# Patient Record
Sex: Female | Born: 1935 | Race: White | Hispanic: No | Marital: Married | Smoking: Never smoker
Health system: Southern US, Community
[De-identification: ages and names within clinical notes are randomized; demographics above are authoritative.]

## PROBLEM LIST (undated history)

## (undated) DIAGNOSIS — F419 Anxiety disorder, unspecified: Secondary | ICD-10-CM

## (undated) DIAGNOSIS — R413 Other amnesia: Secondary | ICD-10-CM

## (undated) DIAGNOSIS — I480 Paroxysmal atrial fibrillation: Secondary | ICD-10-CM

## (undated) DIAGNOSIS — M1990 Unspecified osteoarthritis, unspecified site: Secondary | ICD-10-CM

## (undated) DIAGNOSIS — N393 Stress incontinence (female) (male): Secondary | ICD-10-CM

## (undated) DIAGNOSIS — K644 Residual hemorrhoidal skin tags: Secondary | ICD-10-CM

## (undated) DIAGNOSIS — E785 Hyperlipidemia, unspecified: Secondary | ICD-10-CM

## (undated) HISTORY — DX: Unspecified osteoarthritis, unspecified site: M19.90

## (undated) HISTORY — DX: Anxiety disorder, unspecified: F41.9

## (undated) HISTORY — DX: Paroxysmal atrial fibrillation: I48.0

## (undated) HISTORY — DX: Hyperlipidemia, unspecified: E78.5

## (undated) HISTORY — DX: Residual hemorrhoidal skin tags: K64.4

## (undated) HISTORY — DX: Stress incontinence (female) (male): N39.3

## (undated) HISTORY — PX: VAGINAL HYSTERECTOMY: SUR661

## (undated) HISTORY — PX: APPENDECTOMY: SHX54

---

## 2001-09-29 ENCOUNTER — Encounter: Admission: RE | Admit: 2001-09-29 | Discharge: 2001-09-29 | Payer: Self-pay | Admitting: *Deleted

## 2001-09-29 ENCOUNTER — Encounter: Payer: Self-pay | Admitting: *Deleted

## 2002-10-18 ENCOUNTER — Encounter: Payer: Self-pay | Admitting: *Deleted

## 2002-10-18 ENCOUNTER — Encounter: Admission: RE | Admit: 2002-10-18 | Discharge: 2002-10-18 | Payer: Self-pay | Admitting: *Deleted

## 2003-04-20 ENCOUNTER — Encounter: Payer: Self-pay | Admitting: Internal Medicine

## 2003-04-20 ENCOUNTER — Encounter: Admission: RE | Admit: 2003-04-20 | Discharge: 2003-04-20 | Payer: Self-pay | Admitting: Internal Medicine

## 2004-09-10 ENCOUNTER — Encounter: Admission: RE | Admit: 2004-09-10 | Discharge: 2004-09-10 | Payer: Self-pay | Admitting: Internal Medicine

## 2004-09-23 ENCOUNTER — Encounter: Admission: RE | Admit: 2004-09-23 | Discharge: 2004-09-23 | Payer: Self-pay | Admitting: Internal Medicine

## 2005-09-24 ENCOUNTER — Ambulatory Visit: Payer: Self-pay | Admitting: Gastroenterology

## 2005-09-26 ENCOUNTER — Encounter: Admission: RE | Admit: 2005-09-26 | Discharge: 2005-09-26 | Payer: Self-pay | Admitting: Internal Medicine

## 2005-10-06 ENCOUNTER — Ambulatory Visit: Payer: Self-pay | Admitting: Gastroenterology

## 2006-09-30 ENCOUNTER — Encounter: Admission: RE | Admit: 2006-09-30 | Discharge: 2006-09-30 | Payer: Self-pay | Admitting: Internal Medicine

## 2008-01-17 ENCOUNTER — Encounter: Admission: RE | Admit: 2008-01-17 | Discharge: 2008-01-17 | Payer: Self-pay | Admitting: Internal Medicine

## 2008-02-07 ENCOUNTER — Encounter: Admission: RE | Admit: 2008-02-07 | Discharge: 2008-02-07 | Payer: Self-pay | Admitting: Orthopedic Surgery

## 2008-02-09 ENCOUNTER — Ambulatory Visit (HOSPITAL_BASED_OUTPATIENT_CLINIC_OR_DEPARTMENT_OTHER): Admission: RE | Admit: 2008-02-09 | Discharge: 2008-02-09 | Payer: Self-pay | Admitting: Orthopedic Surgery

## 2010-12-21 ENCOUNTER — Encounter: Payer: Self-pay | Admitting: Internal Medicine

## 2011-04-15 NOTE — Op Note (Signed)
NAMEPRESLYNN, BIER NO.:  [id number]   MEDICAL RECORD NO.:  [id number]          PATIENT TYPE:  AMB   LOCATION:  DSC                          FACILITY:  [facility]   PHYSICIAN:  Leonides Grills, M.D.     DATE OF BIRTH:  [birth year]   DATE OF PROCEDURE:  02/09/2008  DATE OF DISCHARGE:                               OPERATIVE REPORT   PREOPERATIVE DIAGNOSIS:  Complication of hardware, right foot.   POSTOPERATIVE DIAGNOSIS:  Complication of hardware, right foot.   OPERATION:  Hardware removal, deep right foot.   ANESTHESIA:  General.   SURGEON:  Leonides Grills, M.D.   ASSISTANT:  Richardean Canal, P.A.   ESTIMATED BLOOD LOSS:  Minimal.   TOURNIQUET:  None.   COMPLICATIONS:  None.   DISPOSITION:  Stable to PR.   INDICATIONS:  This is an 77 year old female who had a previous fixation  to her right first metatarsal approximately 20 years ago.  The screw  head has become prominent and painful.  She was consented to the above  procedure.  All risks which include infection, nerve or vessel injury,  scar tissue formation that may become painful, were all explained,  questions were encouraged and answered.   OPERATION:  The patient was brought to the operating room and placed in  supine position after adequate general endotracheal tube anesthesia was  administered as well as Ancef gram IV piggyback.  The right lower  extremity was then prepped and draped in sterile manner.  No tourniquet  was used.  A longitudinal incision over the prominent head was then  made.  Dissection was carried down through skin.  Hemostasis was  obtained.  The actual head was freed of any soft tissue within the  hexagonal head and then with a small frag screwdriver the screw was  removed.  This was intact.  No evidence of hardware failure.  The area  was copiously irrigated with normal saline.  Skin was closed with 4-0  nylon stitch.  Sterile dressing was applied.  Hard sole shoe was  applied.   Patient was stable to PR.     Leonides Grills, M.D.  Electronically Signed    PB/MEDQ  D:  02/09/2008  T:  02/10/2008  Job:  [id number]

## 2011-08-25 LAB — POCT HEMOGLOBIN-HEMACUE: Hemoglobin: 13.2

## 2012-02-26 LAB — IFOBT (OCCULT BLOOD): IFOBT: POSITIVE

## 2012-03-10 ENCOUNTER — Encounter: Payer: Self-pay | Admitting: Gastroenterology

## 2012-04-01 ENCOUNTER — Encounter: Payer: Self-pay | Admitting: Gastroenterology

## 2012-04-01 ENCOUNTER — Ambulatory Visit (INDEPENDENT_AMBULATORY_CARE_PROVIDER_SITE_OTHER): Payer: Medicare Other | Admitting: Gastroenterology

## 2012-04-01 VITALS — BP 98/60 | HR 60 | Ht 65.0 in | Wt 107.0 lb

## 2012-04-01 DIAGNOSIS — R195 Other fecal abnormalities: Secondary | ICD-10-CM

## 2012-04-01 MED ORDER — MOVIPREP 100 G PO SOLR: 1.0000 | Freq: Once | ORAL | Status: DC

## 2012-04-01 NOTE — Patient Instructions (Signed)
You have been scheduled for a colonoscopy. Please follow written instructions given to you at your visit today.  Please pick up your prep kit at the pharmacy within the next 1-3 days. cc: Rodrigo Ran, MD

## 2012-04-01 NOTE — Progress Notes (Signed)
History of Present Illness: This is an 78 year old female here today with her husband. She was recently found to have Hemosure positive stool. I reviewed her recent records sent from Dr. Laurey Morale office. Her blood work was unremarkable.  She has no gastrointestinal complaints. She underwent screening colonoscopy in 2006 which was normal. Denies weight loss, abdominal pain, constipation, diarrhea, change in stool caliber, melena, hematochezia, nausea, vomiting, dysphagia, reflux symptoms, chest pain.  No Known Allergies Outpatient Prescriptions Prior to Visit  Medication Sig Dispense Refill  . alendronate (FOSAMAX) 70 MG tablet Take 70 mg by mouth every 7 (seven) days. Take with a full glass of water on an empty stomach.      . Calcium Carbonate (CALTRATE 600 PO) Take 1 capsule by mouth 2 (two) times daily.      . rosuvastatin (CRESTOR) 5 MG tablet Take 5 mg by mouth daily.      Marland Kitchen zolpidem (AMBIEN) 10 MG tablet Take 5 mg by mouth at bedtime as needed.       . citalopram (CELEXA) 40 MG tablet Take 40 mg by mouth daily.       Past Medical History  Diagnosis Date  . Hyperlipidemia   . Depression   . Allergic rhinitis   . Duodenal ulcer   . External hemorrhoid   . Anxiety   . SUI (stress urinary incontinence), female   . Osteoarthritis   . Osteoporosis   . Vitamin d deficiency    Past Surgical History  Procedure Date  . Vaginal hysterectomy   . Appendectomy    History   Social History  . Marital Status: Married    Spouse Name: N/A    Number of Children: 2  . Years of Education: N/A   Occupational History  . [employer]     Retired    Social History Main Topics  . Smoking status: Never Smoker   . Smokeless tobacco: Never Used  . Alcohol Use: No  . Drug Use: No  . Sexually Active: None   Other Topics Concern  . None   Social History Narrative   Daily caffeine    Family History  Problem Relation Age of Onset  . Colon cancer Neg Hx   . Colon polyps Sister      Review of Systems: Pertinent positive and negative review of systems were noted in the above HPI section. All other review of systems were otherwise negative. Physical Exam: General: Well developed , well nourished, no acute distress Head: Normocephalic and atraumatic Eyes:  sclerae anicteric, EOMI Ears: Normal auditory acuity Mouth: No deformity or lesions Neck: Supple, no masses or thyromegaly Lungs: Clear throughout to auscultation Heart: Regular rate and rhythm; no murmurs, rubs or bruits Abdomen: Soft, non tender and non distended. No masses, hepatosplenomegaly or hernias noted. Normal Bowel sounds Rectal: Deferred to colonoscopy  Musculoskeletal: Symmetrical with no gross deformities  Skin: No lesions on visible extremities Pulses:  Normal pulses noted Extremities: No clubbing, cyanosis, edema or deformities noted Neurological: Alert oriented x 4, grossly nonfocal Cervical Nodes:  No significant cervical adenopathy Inguinal Nodes: No significant inguinal adenopathy Psychological:  Alert and cooperative. Normal mood and affect  Assessment and Recommendations:  1. Hemoccult-positive stool, asymptomatic. Rule out colorectal neoplasms, AVMs, hemorrhoids and other disorders.  The risks, benefits, and alternatives to colonoscopy with possible biopsy and possible polypectomy were discussed with the patient and they consent to proceed.

## 2012-04-02 ENCOUNTER — Encounter: Payer: Self-pay | Admitting: Gastroenterology

## 2012-04-02 ENCOUNTER — Ambulatory Visit (AMBULATORY_SURGERY_CENTER): Payer: Medicare Other | Admitting: Gastroenterology

## 2012-04-02 VITALS — BP 134/66 | HR 63 | Temp 96.4°F | Resp 18 | Ht 65.0 in | Wt 107.0 lb

## 2012-04-02 DIAGNOSIS — K5520 Angiodysplasia of colon without hemorrhage: Secondary | ICD-10-CM

## 2012-04-02 DIAGNOSIS — R195 Other fecal abnormalities: Secondary | ICD-10-CM

## 2012-04-02 MED ORDER — SODIUM CHLORIDE 0.9 % IV SOLN: 500.0000 mL | INTRAVENOUS | Status: DC

## 2012-04-02 NOTE — Patient Instructions (Signed)
Discharge instructions given with verbal understanding. Resume previous medications.YOU HAD AN ENDOSCOPIC PROCEDURE TODAY AT THE [facility]: Refer to the procedure report that was given to you for any specific questions about what was found during the examination.  If the procedure report does not answer your questions, please call your gastroenterologist to clarify.  If you requested that your care partner not be given the details of your procedure findings, then the procedure report has been included in a sealed envelope for you to review at your convenience later.  YOU SHOULD EXPECT: Some feelings of bloating in the abdomen. Passage of more gas than usual.  Walking can help get rid of the air that was put into your GI tract during the procedure and reduce the bloating. If you had a lower endoscopy (such as a colonoscopy or flexible sigmoidoscopy) you may notice spotting of blood in your stool or on the toilet paper. If you underwent a bowel prep for your procedure, then you may not have a normal bowel movement for a few days.  DIET: Your first meal following the procedure should be a light meal and then it is ok to progress to your normal diet.  A half-sandwich or bowl of soup is an example of a good first meal.  Heavy or fried foods are harder to digest and may make you feel nauseous or bloated.  Likewise meals heavy in dairy and vegetables can cause extra gas to form and this can also increase the bloating.  Drink plenty of fluids but you should avoid alcoholic beverages for 24 hours.  ACTIVITY: Your care partner should take you home directly after the procedure.  You should plan to take it easy, moving slowly for the rest of the day.  You can resume normal activity the day after the procedure however you should NOT DRIVE or use heavy machinery for 24 hours (because of the sedation medicines used during the test).    SYMPTOMS TO REPORT IMMEDIATELY: A gastroenterologist can be reached at  any hour.  During normal business hours, 8:30 AM to 5:00 PM Monday through Friday, call [phone].  After hours and on weekends, please call the GI answering service at [phone] who will take a message and have the physician on call contact you.   Following lower endoscopy (colonoscopy or flexible sigmoidoscopy):  Excessive amounts of blood in the stool  Significant tenderness or worsening of abdominal pains  Swelling of the abdomen that is new, acute  Fever of 100F or higher  FOLLOW UP: If any biopsies were taken you will be contacted by phone or by letter within the next 1-3 weeks.  Call your gastroenterologist if you have not heard about the biopsies in 3 weeks.  Our staff will call the home number listed on your records the next business day following your procedure to check on you and address any questions or concerns that you may have at that time regarding the information given to you following your procedure. This is a courtesy call and so if there is no answer at the home number and we have not heard from you through the emergency physician on call, we will assume that you have returned to your regular daily activities without incident.  SIGNATURES/CONFIDENTIALITY: You and/or your care partner have signed paperwork which will be entered into your electronic medical record.  These signatures attest to the fact that that the information above on your After Visit Summary has been reviewed and is understood.    Full responsibility of the confidentiality of this discharge information lies with you and/or your care-partner.  

## 2012-04-02 NOTE — Op Note (Signed)
[facility] [address] [place], [place]  [address]  COLONOSCOPY PROCEDURE REPORT  PATIENT:  Madison, Maxwell  MR#:  [id number] BIRTHDATE:  Jun 04, 1920, 89 yrs. old  GENDER:  female ENDOSCOPIST:  Judie Petit T. Russella Dar, MD, [facility]  PROCEDURE DATE:  04/02/2012 PROCEDURE:  Colonoscopy 19147 ASA CLASS:  Class II INDICATIONS:  1) heme positive stool MEDICATIONS:   These medications were titrated to patient response per physician's verbal order, Fentanyl 50 mcg IV, Versed 4 mg IV DESCRIPTION OF PROCEDURE:   After the risks benefits and alternatives of the procedure were thoroughly explained, informed consent was obtained.  Digital rectal exam was performed and revealed no abnormalities.   The LB [id number]-[id number] endoscope was introduced through the anus and advanced to the cecum, which was identified by both the appendix and ileocecal valve, limited by a tortuous colon.    The quality of the prep was good, using MoviPrep.  The instrument was then slowly withdrawn as the colon was fully examined. <<PROCEDUREIMAGES>> FINDINGS: Two arteriovenous malformations were seen in the in the cecum. They were non-bleeding. They were 3 mm in size.  Otherwise normal colonoscopy without other polyps, masses, vascular ectasias, or inflammatory changes.  Retroflexed views in the rectum revealed no abnormalities.    The time to cecum =  5.25 minutes. The scope was then withdrawn (time =  8.5  min) from the patient and the procedure completed.  COMPLICATIONS:  None  ENDOSCOPIC IMPRESSION: 1) AVM's in the cecum  RECOMMENDATIONS: 1) Given your age, you will not need another colonoscopy or routine stool hemoccult testing for colon cancer screening or polyp surveillance. These types of tests usually stop around the age 62.  Madison Maxwell. Russella Dar, MD, [facility]  CC:  Rodrigo Ran, MD  n. Rosalie DoctorVenita Maxwell. Stark at 04/02/2012 10:38 AM  Deatra Canter, [phone]

## 2012-04-05 ENCOUNTER — Telehealth: Payer: Self-pay | Admitting: *Deleted

## 2012-04-05 NOTE — Telephone Encounter (Signed)
  Follow up Call-  Call back number 04/02/2012  Post procedure Call Back phone  # [phone]  Permission to leave phone message Yes     Patient questions:  Do you have a fever, pain , or abdominal swelling? no Pain Score  0 *  Have you tolerated food without any problems? yes  Have you been able to return to your normal activities? yes  Do you have any questions about your discharge instructions: Diet   no Medications  no Follow up visit  no  Do you have questions or concerns about your Care? no  Actions: * If pain score is 4 or above: No action needed, pain <4.  You made the experience wonderful.

## 2012-10-25 ENCOUNTER — Emergency Department (HOSPITAL_COMMUNITY): Payer: Medicare Other

## 2012-10-25 ENCOUNTER — Emergency Department (HOSPITAL_COMMUNITY)
Admission: EM | Admit: 2012-10-25 | Discharge: 2012-10-25 | Disposition: A | Discharge status: Home / Self Care | Payer: Medicare Other | Attending: Emergency Medicine | Admitting: Emergency Medicine

## 2012-10-25 ENCOUNTER — Encounter (HOSPITAL_COMMUNITY): Payer: Self-pay | Admitting: *Deleted

## 2012-10-25 DIAGNOSIS — W010XXA Fall on same level from slipping, tripping and stumbling without subsequent striking against object, initial encounter: Secondary | ICD-10-CM | POA: Insufficient documentation

## 2012-10-25 DIAGNOSIS — Z8739 Personal history of other diseases of the musculoskeletal system and connective tissue: Secondary | ICD-10-CM | POA: Insufficient documentation

## 2012-10-25 DIAGNOSIS — W19XXXA Unspecified fall, initial encounter: Secondary | ICD-10-CM

## 2012-10-25 DIAGNOSIS — Z87448 Personal history of other diseases of urinary system: Secondary | ICD-10-CM | POA: Insufficient documentation

## 2012-10-25 DIAGNOSIS — F329 Major depressive disorder, single episode, unspecified: Secondary | ICD-10-CM | POA: Insufficient documentation

## 2012-10-25 DIAGNOSIS — S0100XA Unspecified open wound of scalp, initial encounter: Secondary | ICD-10-CM | POA: Insufficient documentation

## 2012-10-25 DIAGNOSIS — F411 Generalized anxiety disorder: Secondary | ICD-10-CM | POA: Insufficient documentation

## 2012-10-25 DIAGNOSIS — Z79899 Other long term (current) drug therapy: Secondary | ICD-10-CM | POA: Insufficient documentation

## 2012-10-25 DIAGNOSIS — Z8719 Personal history of other diseases of the digestive system: Secondary | ICD-10-CM | POA: Insufficient documentation

## 2012-10-25 DIAGNOSIS — M810 Age-related osteoporosis without current pathological fracture: Secondary | ICD-10-CM | POA: Insufficient documentation

## 2012-10-25 DIAGNOSIS — S0101XA Laceration without foreign body of scalp, initial encounter: Secondary | ICD-10-CM

## 2012-10-25 DIAGNOSIS — Y9389 Activity, other specified: Secondary | ICD-10-CM | POA: Insufficient documentation

## 2012-10-25 DIAGNOSIS — F3289 Other specified depressive episodes: Secondary | ICD-10-CM | POA: Insufficient documentation

## 2012-10-25 DIAGNOSIS — E559 Vitamin D deficiency, unspecified: Secondary | ICD-10-CM | POA: Insufficient documentation

## 2012-10-25 DIAGNOSIS — Y929 Unspecified place or not applicable: Secondary | ICD-10-CM | POA: Insufficient documentation

## 2012-10-25 DIAGNOSIS — S0181XA Laceration without foreign body of other part of head, initial encounter: Secondary | ICD-10-CM

## 2012-10-25 DIAGNOSIS — Z7982 Long term (current) use of aspirin: Secondary | ICD-10-CM | POA: Insufficient documentation

## 2012-10-25 DIAGNOSIS — E785 Hyperlipidemia, unspecified: Secondary | ICD-10-CM | POA: Insufficient documentation

## 2012-10-25 DIAGNOSIS — S01409A Unspecified open wound of unspecified cheek and temporomandibular area, initial encounter: Secondary | ICD-10-CM | POA: Insufficient documentation

## 2012-10-25 MED ORDER — BACITRACIN ZINC 500 UNIT/GM EX OINT
TOPICAL_OINTMENT | CUTANEOUS | Status: AC
  Filled 2012-10-25: qty 0.9

## 2012-10-25 NOTE — ED Notes (Signed)
Pt to room via [facility] s/p fall; pt reports was going to bed and tripped over shoes in the floor; she struck table bedside her bed; pt with small puncture wound to back of head and skin tear to left cheek; pt denies LOC; pt denies pain at present; denies taking blood thinners except 81mg  ASA.

## 2012-10-25 NOTE — ED Provider Notes (Addendum)
History     CSN: [id number]  Arrival date & time 10/25/12  0109   First MD Initiated Contact with Patient 10/25/12 0122      Chief Complaint  Patient presents with  . Fall  . Head Laceration    (Consider location/radiation/quality/duration/timing/severity/associated sxs/prior treatment) HPI 78 year old female presents to emergency department via EMS after fall. Patient reports she was returning to bed after going to the bathroom when she slipped on her bedroom slippers. She struck the back of her head on a table. She denies any LOC. She has had bleeding from the wound. Husband reports as they were trying to get her ready to go to the hospital, she slipped on her regular shoes and fell forward, sustaining a cut to her left cheek. Again there was no LOC. Patient reports she took one half of a generic Ambien tonight, and has felt very tired. Patient had an update of her DTaP 2 weeks ago.  Past Medical History  Diagnosis Date  . Hyperlipidemia   . Depression   . Allergic rhinitis   . Duodenal ulcer   . External hemorrhoid   . Anxiety   . SUI (stress urinary incontinence), female   . Osteoarthritis   . Osteoporosis   . Vitamin D deficiency     Past Surgical History  Procedure Date  . Vaginal hysterectomy   . Appendectomy     Family History  Problem Relation Age of Onset  . Colon cancer Neg Hx   . Colon polyps Sister     History  Substance Use Topics  . Smoking status: Never Smoker   . Smokeless tobacco: Never Used  . Alcohol Use: No    OB History    Grav Para Term Preterm Abortions TAB SAB Ect Mult Living                  Review of Systems  All other systems reviewed and are negative.    Allergies  Review of patient's allergies indicates no known allergies.  Home Medications   Current Outpatient Rx  Name  Route  Sig  Dispense  Refill  . ALENDRONATE SODIUM 70 MG PO TABS   Oral   Take 70 mg by mouth every 7 (seven) days. Take with a full glass of water  on an empty stomach.         . ASPIRIN 81 MG PO CHEW   Oral   Chew 81 mg by mouth daily.         . EXELON 4.6 MG/24HR TD PT24   Transdermal   Place 1 patch onto the skin daily.          Carma Leaven M PLUS PO TABS   Oral   Take 1 tablet by mouth daily.         . OMEGA-3-ACID ETHYL ESTERS 1 G PO CAPS   Oral   Take 2 g by mouth 2 (two) times daily.         Marland Kitchen ROSUVASTATIN CALCIUM 10 MG PO TABS   Oral   Take 5 mg by mouth daily. Take 1/2 tablet daily         . VITAMIN C 500 MG PO TABS   Oral   Take 500 mg by mouth daily.         Marland Kitchen ZOLPIDEM TARTRATE 10 MG PO TABS   Oral   Take 5 mg by mouth at bedtime as needed. Take 1/2 tablet  BP 112/69  Pulse 70  Temp 97.6 F (36.4 C)  Resp 18  SpO2 100%  Physical Exam  Nursing note and vitals reviewed. Constitutional: She is oriented to person, place, and time. She appears well-developed and well-nourished.  HENT:  Head: Normocephalic.  Right Ear: External ear normal.  Left Ear: External ear normal.  Nose: Nose normal.  Mouth/Throat: Oropharynx is clear and moist.       0.5 cm laceration to the occiput bleeding controlled. Some contusion noted around the wound. 2 cm skin tear noted to left cheek. Bleeding controlled  Eyes: Conjunctivae normal and EOM are normal. Pupils are equal, round, and reactive to light.  Neck: Normal range of motion. Neck supple. No JVD present. No tracheal deviation present. No thyromegaly present.  Cardiovascular: Normal rate, regular rhythm, normal heart sounds and intact distal pulses.  Exam reveals no gallop and no friction rub.   No murmur heard. Pulmonary/Chest: Effort normal and breath sounds normal. No stridor. No respiratory distress. She has no wheezes. She has no rales. She exhibits no tenderness.  Abdominal: Soft. Bowel sounds are normal. She exhibits no distension and no mass. There is no tenderness. There is no rebound and no guarding.  Musculoskeletal: Normal range of  motion. She exhibits no edema and no tenderness.  Lymphadenopathy:    She has no cervical adenopathy.  Neurological: She is alert and oriented to person, place, and time. No cranial nerve deficit. She exhibits normal muscle tone. Coordination normal.  Skin: Skin is warm and dry. No rash noted. No erythema. No pallor.  Psychiatric: She has a normal mood and affect. Her behavior is normal. Judgment and thought content normal.    ED Course  Procedures (including critical care time)  Labs Reviewed - No data to display Ct Head Wo Contrast  10/25/2012  *RADIOLOGY REPORT*  Clinical Data:  Tripped and fell; hit head on bedside table. Injury to left cheek and posterior aspect of head; concern for cervical spine injury.  CT HEAD WITHOUT CONTRAST AND CT CERVICAL SPINE WITHOUT CONTRAST  Technique:  Multidetector CT imaging of the head and cervical spine was performed following the standard protocol without intravenous contrast.  Multiplanar CT image reconstructions of the cervical spine were also generated.  Comparison: None  CT HEAD  Findings: There is no evidence of acute infarction, mass lesion, or intra- or extra-axial hemorrhage on CT.  Prominence of the ventricles and sulci suggests mild cortical volume loss.  Mild periventricular white matter change likely reflects small vessel ischemic microangiopathy.  The brainstem and fourth ventricle are within normal limits.  The basal ganglia are unremarkable in appearance.  The cerebral hemispheres demonstrate grossly normal gray-white differentiation. No mass effect or midline shift is seen.  There is no evidence of fracture; visualized osseous structures are unremarkable in appearance.  The orbits are within normal limits. The paranasal sinuses and mastoid air cells are well-aerated.  Mild soft tissue swelling is noted overlying the occiput.  IMPRESSION:  1.  No evidence of traumatic intracranial injury or fracture. 2.  Mild soft tissue swelling overlying the  occiput. 3.  Mild cortical volume loss and mild small vessel ischemic microangiopathy.  CT CERVICAL SPINE  Findings: There is no evidence of acute fracture or subluxation. There is grade 1 anterolisthesis of C4 on C5, and significant degenerative change along the lower cervical spine, particularly at C5-C6, with associated loss of the intervertebral disc space at C5- C6.  Prevertebral soft tissues are within normal limits.  Prominent  scarring is noted at the lung apices.  There is a heterogeneous 3.0 x 2.2 cm multilobulated mass at the right thyroid gland, with scattered calcification.  IMPRESSION:  1.  No evidence of acute fracture or subluxation along the cervical spine. 2.  Degenerative change noted along the lower cervical spine. 3.  Heterogeneous 3.0 x 2.2 cm multilobulated mass at the right thyroid gland, with scattered calcification. 4.  Prominent scarring at the lung apices.   Original Report Authenticated By: Tonia Ghent, M.D.    Ct Cervical Spine Wo Contrast  10/25/2012  *RADIOLOGY REPORT*  Clinical Data:  Tripped and fell; hit head on bedside table. Injury to left cheek and posterior aspect of head; concern for cervical spine injury.  CT HEAD WITHOUT CONTRAST AND CT CERVICAL SPINE WITHOUT CONTRAST  Technique:  Multidetector CT imaging of the head and cervical spine was performed following the standard protocol without intravenous contrast.  Multiplanar CT image reconstructions of the cervical spine were also generated.  Comparison: None  CT HEAD  Findings: There is no evidence of acute infarction, mass lesion, or intra- or extra-axial hemorrhage on CT.  Prominence of the ventricles and sulci suggests mild cortical volume loss.  Mild periventricular white matter change likely reflects small vessel ischemic microangiopathy.  The brainstem and fourth ventricle are within normal limits.  The basal ganglia are unremarkable in appearance.  The cerebral hemispheres demonstrate grossly normal gray-white  differentiation. No mass effect or midline shift is seen.  There is no evidence of fracture; visualized osseous structures are unremarkable in appearance.  The orbits are within normal limits. The paranasal sinuses and mastoid air cells are well-aerated.  Mild soft tissue swelling is noted overlying the occiput.  IMPRESSION:  1.  No evidence of traumatic intracranial injury or fracture. 2.  Mild soft tissue swelling overlying the occiput. 3.  Mild cortical volume loss and mild small vessel ischemic microangiopathy.  CT CERVICAL SPINE  Findings: There is no evidence of acute fracture or subluxation. There is grade 1 anterolisthesis of C4 on C5, and significant degenerative change along the lower cervical spine, particularly at C5-C6, with associated loss of the intervertebral disc space at C5- C6.  Prevertebral soft tissues are within normal limits.  Prominent scarring is noted at the lung apices.  There is a heterogeneous 3.0 x 2.2 cm multilobulated mass at the right thyroid gland, with scattered calcification.  IMPRESSION:  1.  No evidence of acute fracture or subluxation along the cervical spine. 2.  Degenerative change noted along the lower cervical spine. 3.  Heterogeneous 3.0 x 2.2 cm multilobulated mass at the right thyroid gland, with scattered calcification. 4.  Prominent scarring at the lung apices.   Original Report Authenticated By: Tonia Ghent, M.D.      1. Fall at home   2. Laceration of occipital scalp   3. Facial laceration       MDM  78 year old female with fall at home x2. She has minor skin wounds, we'll get CT head and C-spine. She has a normal mentation, and is at her baseline per her husband.        Olivia Mackie, MD 10/25/12 4540  Olivia Mackie, MD 10/25/12 [phone]

## 2015-01-24 ENCOUNTER — Ambulatory Visit (HOSPITAL_COMMUNITY)
Admission: RE | Admit: 2015-01-24 | Discharge: 2015-01-24 | Disposition: A | Discharge status: Home / Self Care | Payer: Medicare Other | Source: Ambulatory Visit | Attending: Internal Medicine | Admitting: Internal Medicine

## 2015-01-24 ENCOUNTER — Other Ambulatory Visit (HOSPITAL_COMMUNITY): Payer: Self-pay | Admitting: Internal Medicine

## 2015-01-24 DIAGNOSIS — M5440 Lumbago with sciatica, unspecified side: Secondary | ICD-10-CM

## 2015-02-02 ENCOUNTER — Ambulatory Visit (HOSPITAL_COMMUNITY)
Admission: RE | Admit: 2015-02-02 | Discharge: 2015-02-02 | Disposition: A | Discharge status: Home / Self Care | Payer: Medicare Other | Source: Ambulatory Visit | Attending: Interventional Radiology | Admitting: Interventional Radiology

## 2015-02-02 ENCOUNTER — Other Ambulatory Visit (HOSPITAL_COMMUNITY): Payer: Self-pay | Admitting: Interventional Radiology

## 2015-02-02 DIAGNOSIS — IMO0002 Reserved for concepts with insufficient information to code with codable children: Secondary | ICD-10-CM

## 2015-04-02 ENCOUNTER — Encounter (HOSPITAL_COMMUNITY): Payer: Self-pay

## 2015-04-02 ENCOUNTER — Other Ambulatory Visit (HOSPITAL_COMMUNITY): Payer: Self-pay | Admitting: Internal Medicine

## 2015-04-02 ENCOUNTER — Ambulatory Visit (HOSPITAL_COMMUNITY)
Admission: RE | Admit: 2015-04-02 | Discharge: 2015-04-02 | Disposition: A | Discharge status: Home / Self Care | Payer: Medicare Other | Source: Ambulatory Visit | Attending: Internal Medicine | Admitting: Internal Medicine

## 2015-04-02 DIAGNOSIS — M810 Age-related osteoporosis without current pathological fracture: Secondary | ICD-10-CM | POA: Insufficient documentation

## 2015-04-02 MED ORDER — DENOSUMAB 60 MG/ML ~~LOC~~ SOLN
60.0000 mg | Freq: Once | SUBCUTANEOUS | Status: AC
  Administered 2015-04-02: 60 mg via SUBCUTANEOUS
  Filled 2015-04-02: qty 1

## 2015-04-02 NOTE — Discharge Instructions (Signed)
Denosumab injection What is this medicine? DENOSUMAB (den oh sue mab) slows bone breakdown. Prolia is used to treat osteoporosis in women after menopause and in men. Xgeva is used to prevent bone fractures and other bone problems caused by cancer bone metastases. Xgeva is also used to treat giant cell tumor of the bone. This medicine may be used for other purposes; ask your health care provider or pharmacist if you have questions. COMMON BRAND NAME(S): Prolia, XGEVA What should I tell my health care provider before I take this medicine? They need to know if you have any of these conditions: -dental disease -eczema -infection or history of infections -kidney disease or on dialysis -low blood calcium or vitamin D -malabsorption syndrome -scheduled to have surgery or tooth extraction -taking medicine that contains denosumab -thyroid or parathyroid disease -an unusual reaction to denosumab, other medicines, foods, dyes, or preservatives -pregnant or trying to get pregnant -breast-feeding How should I use this medicine? This medicine is for injection under the skin. It is given by a health care professional in a hospital or clinic setting. If you are getting Prolia, a special MedGuide will be given to you by the pharmacist with each prescription and refill. Be sure to read this information carefully each time. For Prolia, talk to your pediatrician regarding the use of this medicine in children. Special care may be needed. For Xgeva, talk to your pediatrician regarding the use of this medicine in children. While this drug may be prescribed for children as young as 13 years for selected conditions, precautions do apply. Overdosage: If you think you've taken too much of this medicine contact a poison control center or emergency room at once. Overdosage: If you think you have taken too much of this medicine contact a poison control center or emergency room at once. NOTE: This medicine is only for  you. Do not share this medicine with others. What if I miss a dose? It is important not to miss your dose. Call your doctor or health care professional if you are unable to keep an appointment. What may interact with this medicine? Do not take this medicine with any of the following medications: -other medicines containing denosumab This medicine may also interact with the following medications: -medicines that suppress the immune system -medicines that treat cancer -steroid medicines like prednisone or cortisone This list may not describe all possible interactions. Give your health care provider a list of all the medicines, herbs, non-prescription drugs, or dietary supplements you use. Also tell them if you smoke, drink alcohol, or use illegal drugs. Some items may interact with your medicine. What should I watch for while using this medicine? Visit your doctor or health care professional for regular checks on your progress. Your doctor or health care professional may order blood tests and other tests to see how you are doing. Call your doctor or health care professional if you get a cold or other infection while receiving this medicine. Do not treat yourself. This medicine may decrease your body's ability to fight infection. You should make sure you get enough calcium and vitamin D while you are taking this medicine, unless your doctor tells you not to. Discuss the foods you eat and the vitamins you take with your health care professional. See your dentist regularly. Brush and floss your teeth as directed. Before you have any dental work done, tell your dentist you are receiving this medicine. Do not become pregnant while taking this medicine or for 5 months after stopping   it. Women should inform their doctor if they wish to become pregnant or think they might be pregnant. There is a potential for serious side effects to an unborn child. Talk to your health care professional or pharmacist for more  information. What side effects may I notice from receiving this medicine? Side effects that you should report to your doctor or health care professional as soon as possible: -allergic reactions like skin rash, itching or hives, swelling of the face, lips, or tongue -breathing problems -chest pain -fast, irregular heartbeat -feeling faint or lightheaded, falls -fever, chills, or any other sign of infection -muscle spasms, tightening, or twitches -numbness or tingling -skin blisters or bumps, or is dry, peels, or red -slow healing or unexplained pain in the mouth or jaw -unusual bleeding or bruising Side effects that usually do not require medical attention (Report these to your doctor or health care professional if they continue or are bothersome.): -muscle pain -stomach upset, gas This list may not describe all possible side effects. Call your doctor for medical advice about side effects. You may report side effects to FDA at 1-800-FDA-1088. Where should I keep my medicine? This medicine is only given in a clinic, doctor's office, or other health care setting and will not be stored at home. NOTE: This sheet is a summary. It may not cover all possible information. If you have questions about this medicine, talk to your doctor, pharmacist, or health care provider.  2015, [employer] Standard. (2012-05-17 12:37:47)  

## 2015-10-03 ENCOUNTER — Ambulatory Visit (HOSPITAL_COMMUNITY)
Admission: RE | Admit: 2015-10-03 | Discharge: 2015-10-03 | Disposition: A | Discharge status: Home / Self Care | Payer: Medicare Other | Source: Ambulatory Visit | Attending: Internal Medicine | Admitting: Internal Medicine

## 2015-10-03 ENCOUNTER — Encounter (HOSPITAL_COMMUNITY): Payer: Self-pay

## 2015-10-03 DIAGNOSIS — M810 Age-related osteoporosis without current pathological fracture: Secondary | ICD-10-CM | POA: Diagnosis not present

## 2015-10-03 MED ORDER — DENOSUMAB 60 MG/ML ~~LOC~~ SOLN
60.0000 mg | Freq: Once | SUBCUTANEOUS | Status: AC
  Administered 2015-10-03: 60 mg via SUBCUTANEOUS
  Filled 2015-10-03: qty 1

## 2015-10-03 NOTE — Discharge Instructions (Signed)
Denosumab injection  What is this medicine?  DENOSUMAB (den oh sue mab) slows bone breakdown. Prolia is used to treat osteoporosis in women after menopause and in men. Xgeva is used to prevent bone fractures and other bone problems caused by cancer bone metastases. Xgeva is also used to treat giant cell tumor of the bone.  This medicine may be used for other purposes; ask your health care provider or pharmacist if you have questions.  What should I tell my health care provider before I take this medicine?  They need to know if you have any of these conditions:  -dental disease  -eczema  -infection or history of infections  -kidney disease or on dialysis  -low blood calcium or vitamin D  -malabsorption syndrome  -scheduled to have surgery or tooth extraction  -taking medicine that contains denosumab  -thyroid or parathyroid disease  -an unusual reaction to denosumab, other medicines, foods, dyes, or preservatives  -pregnant or trying to get pregnant  -breast-feeding  How should I use this medicine?  This medicine is for injection under the skin. It is given by a health care professional in a hospital or clinic setting.  If you are getting Prolia, a special MedGuide will be given to you by the pharmacist with each prescription and refill. Be sure to read this information carefully each time.  For Prolia, talk to your pediatrician regarding the use of this medicine in children. Special care may be needed. For Xgeva, talk to your pediatrician regarding the use of this medicine in children. While this drug may be prescribed for children as young as 13 years for selected conditions, precautions do apply.  Overdosage: If you think you have taken too much of this medicine contact a poison control center or emergency room at once.  NOTE: This medicine is only for you. Do not share this medicine with others.  What if I miss a dose?  It is important not to miss your dose. Call your doctor or health care professional if you are  unable to keep an appointment.  What may interact with this medicine?  Do not take this medicine with any of the following medications:  -other medicines containing denosumab  This medicine may also interact with the following medications:  -medicines that suppress the immune system  -medicines that treat cancer  -steroid medicines like prednisone or cortisone  This list may not describe all possible interactions. Give your health care provider a list of all the medicines, herbs, non-prescription drugs, or dietary supplements you use. Also tell them if you smoke, drink alcohol, or use illegal drugs. Some items may interact with your medicine.  What should I watch for while using this medicine?  Visit your doctor or health care professional for regular checks on your progress. Your doctor or health care professional may order blood tests and other tests to see how you are doing.  Call your doctor or health care professional if you get a cold or other infection while receiving this medicine. Do not treat yourself. This medicine may decrease your body's ability to fight infection.  You should make sure you get enough calcium and vitamin D while you are taking this medicine, unless your doctor tells you not to. Discuss the foods you eat and the vitamins you take with your health care professional.  See your dentist regularly. Brush and floss your teeth as directed. Before you have any dental work done, tell your dentist you are receiving this medicine.  Do   not become pregnant while taking this medicine or for 5 months after stopping it. Women should inform their doctor if they wish to become pregnant or think they might be pregnant. There is a potential for serious side effects to an unborn child. Talk to your health care professional or pharmacist for more information.  What side effects may I notice from receiving this medicine?  Side effects that you should report to your doctor or health care professional as soon as  possible:  -allergic reactions like skin rash, itching or hives, swelling of the face, lips, or tongue  -breathing problems  -chest pain  -fast, irregular heartbeat  -feeling faint or lightheaded, falls  -fever, chills, or any other sign of infection  -muscle spasms, tightening, or twitches  -numbness or tingling  -skin blisters or bumps, or is dry, peels, or red  -slow healing or unexplained pain in the mouth or jaw  -unusual bleeding or bruising  Side effects that usually do not require medical attention (Report these to your doctor or health care professional if they continue or are bothersome.):  -muscle pain  -stomach upset, gas  This list may not describe all possible side effects. Call your doctor for medical advice about side effects. You may report side effects to FDA at 1-800-FDA-1088.  Where should I keep my medicine?  This medicine is only given in a clinic, doctor's office, or other health care setting and will not be stored at home.  NOTE: This sheet is a summary. It may not cover all possible information. If you have questions about this medicine, talk to your doctor, pharmacist, or health care provider.      2016, [employer] Standard. (2012-05-17 12:37:47)

## 2015-10-03 NOTE — Progress Notes (Signed)
Pt currently takes a daily multiple vitamin.  Pt's calcium level is 10.1, within normal range. prolia 60mg  SQ given to left upper arm. Instructions on prolia given to pt.  Pt d/c home with husband ambulatory to lobby.

## 2015-11-09 ENCOUNTER — Encounter (HOSPITAL_COMMUNITY): Payer: Self-pay | Admitting: Emergency Medicine

## 2015-11-09 ENCOUNTER — Emergency Department (HOSPITAL_COMMUNITY): Payer: Medicare Other

## 2015-11-09 ENCOUNTER — Inpatient Hospital Stay (HOSPITAL_COMMUNITY)
Admission: EM | Admit: 2015-11-09 | Discharge: 2015-11-12 | DRG: 065 | Disposition: A | Discharge status: Home Health | Payer: Medicare Other | Attending: Internal Medicine | Admitting: Internal Medicine

## 2015-11-09 DIAGNOSIS — R402252 Coma scale, best verbal response, oriented, at arrival to emergency department: Secondary | ICD-10-CM | POA: Diagnosis present

## 2015-11-09 DIAGNOSIS — I639 Cerebral infarction, unspecified: Secondary | ICD-10-CM | POA: Diagnosis not present

## 2015-11-09 DIAGNOSIS — R4189 Other symptoms and signs involving cognitive functions and awareness: Secondary | ICD-10-CM | POA: Diagnosis present

## 2015-11-09 DIAGNOSIS — E785 Hyperlipidemia, unspecified: Secondary | ICD-10-CM | POA: Diagnosis present

## 2015-11-09 DIAGNOSIS — R413 Other amnesia: Secondary | ICD-10-CM | POA: Diagnosis present

## 2015-11-09 DIAGNOSIS — I6340 Cerebral infarction due to embolism of unspecified cerebral artery: Secondary | ICD-10-CM | POA: Diagnosis not present

## 2015-11-09 DIAGNOSIS — R262 Difficulty in walking, not elsewhere classified: Secondary | ICD-10-CM | POA: Diagnosis not present

## 2015-11-09 DIAGNOSIS — R270 Ataxia, unspecified: Secondary | ICD-10-CM | POA: Insufficient documentation

## 2015-11-09 DIAGNOSIS — R278 Other lack of coordination: Secondary | ICD-10-CM | POA: Diagnosis present

## 2015-11-09 DIAGNOSIS — I6389 Other cerebral infarction: Secondary | ICD-10-CM

## 2015-11-09 DIAGNOSIS — R402362 Coma scale, best motor response, obeys commands, at arrival to emergency department: Secondary | ICD-10-CM | POA: Diagnosis present

## 2015-11-09 DIAGNOSIS — R402142 Coma scale, eyes open, spontaneous, at arrival to emergency department: Secondary | ICD-10-CM | POA: Diagnosis present

## 2015-11-09 DIAGNOSIS — R531 Weakness: Secondary | ICD-10-CM

## 2015-11-09 DIAGNOSIS — Z66 Do not resuscitate: Secondary | ICD-10-CM | POA: Diagnosis present

## 2015-11-09 DIAGNOSIS — R29898 Other symptoms and signs involving the musculoskeletal system: Secondary | ICD-10-CM | POA: Insufficient documentation

## 2015-11-09 DIAGNOSIS — G8194 Hemiplegia, unspecified affecting left nondominant side: Secondary | ICD-10-CM | POA: Diagnosis present

## 2015-11-09 DIAGNOSIS — I10 Essential (primary) hypertension: Secondary | ICD-10-CM | POA: Diagnosis present

## 2015-11-09 DIAGNOSIS — R29700 NIHSS score 0: Secondary | ICD-10-CM | POA: Diagnosis present

## 2015-11-09 DIAGNOSIS — Z7982 Long term (current) use of aspirin: Secondary | ICD-10-CM

## 2015-11-09 HISTORY — DX: Other amnesia: R41.3

## 2015-11-09 LAB — CREATININE, SERUM: CREATININE: 0.51 mg/dL (ref 0.44–1.00)

## 2015-11-09 LAB — BASIC METABOLIC PANEL
Anion gap: 7 (ref 5–15)
BUN: 18 mg/dL (ref 6–20)
CALCIUM: 9.2 mg/dL (ref 8.9–10.3)
CHLORIDE: 103 mmol/L (ref 101–111)
CO2: 31 mmol/L (ref 22–32)
CREATININE: 0.64 mg/dL (ref 0.44–1.00)
GFR calc non Af Amer: >60 mL/min (ref >60)
Glucose, Bld: 149 mg/dL — ABNORMAL HIGH (ref 65–99)
Potassium: 4 mmol/L (ref 3.5–5.1)
SODIUM: 141 mmol/L (ref 135–145)

## 2015-11-09 LAB — CBG MONITORING, ED: Glucose-Capillary: 116 mg/dL — ABNORMAL HIGH (ref 65–99)

## 2015-11-09 LAB — URINALYSIS, ROUTINE W REFLEX MICROSCOPIC
Bilirubin Urine: NEGATIVE
Glucose, UA: NEGATIVE mg/dL
Hgb urine dipstick: NEGATIVE
Ketones, ur: NEGATIVE mg/dL
LEUKOCYTES UA: NEGATIVE
Nitrite: NEGATIVE
PROTEIN: NEGATIVE mg/dL
Specific Gravity, Urine: 1.011 (ref 1.005–1.030)
pH: 5 (ref 5.0–8.0)

## 2015-11-09 LAB — CBC
HCT: 35.7 % — ABNORMAL LOW (ref 36.0–46.0)
HCT: 36.8 % (ref 36.0–46.0)
HEMOGLOBIN: 12 g/dL (ref 12.0–15.0)
Hemoglobin: 11.7 g/dL — ABNORMAL LOW (ref 12.0–15.0)
MCH: 31 pg (ref 26.0–34.0)
MCH: 30.9 pg (ref 26.0–34.0)
MCHC: 32.8 g/dL (ref 30.0–36.0)
MCHC: 32.6 g/dL (ref 30.0–36.0)
MCV: 94.4 fL (ref 78.0–100.0)
MCV: 94.8 fL (ref 78.0–100.0)
PLATELETS: 132 10*3/uL — AB (ref 150–400)
PLATELETS: 132 10*3/uL — AB (ref 150–400)
RBC: 3.78 MIL/uL — AB (ref 3.87–5.11)
RBC: 3.88 MIL/uL (ref 3.87–5.11)
RDW: 12.4 % (ref 11.5–15.5)
RDW: 12.5 % (ref 11.5–15.5)
WBC: 5.1 10*3/uL (ref 4.0–10.5)
WBC: 5.3 10*3/uL (ref 4.0–10.5)

## 2015-11-09 MED ORDER — ENOXAPARIN SODIUM 40 MG/0.4ML ~~LOC~~ SOLN
40.0000 mg | SUBCUTANEOUS | Status: DC
Start: 2015-11-09 — End: 2015-11-12
  Administered 2015-11-09 – 2015-11-11 (×3): 40 mg via SUBCUTANEOUS
  Filled 2015-11-09 (×4): qty 0.4

## 2015-11-09 MED ORDER — ZOLPIDEM TARTRATE 5 MG PO TABS
5.0000 mg | ORAL_TABLET | Freq: Every day | ORAL | Status: DC
  Administered 2015-11-09 – 2015-11-11 (×3): 5 mg via ORAL
  Filled 2015-11-09 (×4): qty 1

## 2015-11-09 MED ORDER — RIVASTIGMINE 9.5 MG/24HR TD PT24
9.5000 mg | MEDICATED_PATCH | Freq: Every day | TRANSDERMAL | Status: DC
  Administered 2015-11-10 – 2015-11-12 (×3): 9.5 mg via TRANSDERMAL
  Filled 2015-11-09 (×3): qty 1

## 2015-11-09 MED ORDER — STROKE: EARLY STAGES OF RECOVERY BOOK
Freq: Once | Status: DC
Start: 2015-11-09 — End: 2015-11-12
  Filled 2015-11-09: qty 1

## 2015-11-09 NOTE — Consult Note (Signed)
Stroke Consult Consulting Physician: Dr Butler Denmark  Chief Complaint: left hand weakness  HPI: Madison Maxwell is an 81 y.o. female hx of HLD, memory deficits presenting with difficulty using her left hand and gait instability. She reports 3 days ago she developed sudden onset of gait instability, trouble walking. Around 1.5 days ago also noted weakness in her left hand, trouble doing coordinated tasks. Symptoms did not resolve so she came to the ED. No prior CVA or TIA. Takes ASA  daily at home.   CT head imaging reviewed and no acute process noted.   Date last known well: 12/06 Time last known well: unclear tPA Given: no, outside tPA window Modified Rankin: Rankin Score=1  Past Medical History  Diagnosis Date  . Hyperlipidemia   . Memory deficit   . Allergic rhinitis   . Duodenal ulcer   . External hemorrhoid   . Anxiety   . SUI (stress urinary incontinence), female   . Osteoarthritis   . Osteoporosis   . Vitamin D deficiency     Past Surgical History  Procedure Laterality Date  . Vaginal hysterectomy    . Appendectomy      Family History  Problem Relation Age of Onset  . Colon cancer Neg Hx   . Colon polyps Sister    Social History:  reports that she has never smoked. She has never used smokeless tobacco. She reports that she does not drink alcohol or use illicit drugs.  Allergies: No Known Allergies  Medications Prior to Admission  Medication Sig Dispense Refill  . aspirin 81 MG chewable tablet Chew 81 mg by mouth daily.    Marland Kitchen denosumab (PROLIA) 60 MG/ML SOLN injection Inject 60 mg into the skin every 6 (six) months. Administer in upper arm, thigh, or abdomen    . Multiple Vitamins-Minerals (MULTIVITAMINS THER. W/MINERALS) TABS Take 1 tablet by mouth daily.    Marland Kitchen omega-3 acid ethyl esters (LOVAZA) 1 G capsule Take 2 g by mouth 2 (two) times daily.    . rivastigmine (EXELON) 9.5 mg/24hr Place 9.5 mg onto the skin daily.     . vitamin B-12 (CYANOCOBALAMIN) 1000 MCG  tablet Take 1,000 mcg by mouth daily.    . vitamin C (ASCORBIC ACID) 500 MG tablet Take 500 mg by mouth daily.    Marland Kitchen zolpidem (AMBIEN) 5 MG tablet Take 5 mg by mouth at bedtime as needed. sleep  0    ROS: Out of a complete 14 system review, the patient complains of only the following symptoms, and all other reviewed systems are negative. +weakness   Physical Examination: Filed Vitals:   11/09/15 1759 11/09/15 1851  BP: 151/82 145/63  Pulse: 88 78  Temp:  98 F (36.7 C)  Resp: 18 18   Physical Exam  Constitutional: He appears well-developed and well-nourished.  Psych: Affect appropriate to situation Eyes: No scleral injection HENT: No OP obstrucion Head: Normocephalic.  Cardiovascular: Normal rate and regular rhythm.  Respiratory: Effort normal and breath sounds normal.  GI: Soft. Bowel sounds are normal. No distension. There is no tenderness.  Skin: WDI   Neurologic Examination: Mental Status: Alert, oriented, thought content appropriate.  Speech fluent without evidence of aphasia. No dysarthria. Able to follow 3 step commands without difficulty. Cranial Nerves: II: funduscopic exam wnl bilaterally, visual fields grossly normal, pupils equal, round, reactive to light and accommodation III,IV, VI: ptosis not present, extra-ocular motions intact bilaterally V,VII: smile symmetric, facial light touch sensation normal bilaterally VIII: hearing normal bilaterally IX,X:  gag reflex present XI: trapezius strength/neck flexion strength normal bilaterally XII: tongue strength normal  Motor: 5/5 strength RUE and RLE Minimal drift LUE proximal 4+/5, distal 4+/5 Proximal LLE 5-/5, distal 5/5 Tone and bulk:normal tone throughout; no atrophy noted Sensory: Pinprick and light touch intact throughout, bilaterally Deep Tendon Reflexes: 2+ and symmetric throughout Plantars: Right: downgoing   Left: downgoing Cerebellar: Mild dysmetria with FTN on the left (though weakness likely  playing a role) Gait: deferred  Laboratory Studies:   Basic Metabolic Panel:  Recent Labs Lab 11/09/15 1352  NA 141  K 4.0  CL 103  CO2 31  GLUCOSE 149*  BUN 18  CREATININE 0.64  CALCIUM 9.2    Liver Function Tests: No results for input(s): AST, ALT, ALKPHOS, BILITOT, PROT, ALBUMIN in the last 168 hours. No results for input(s): LIPASE, AMYLASE in the last 168 hours. No results for input(s): AMMONIA in the last 168 hours.  CBC:  Recent Labs Lab 11/09/15 1352  WBC 5.1  HGB 11.7*  HCT 35.7*  MCV 94.4  PLT 132*    Cardiac Enzymes: No results for input(s): CKTOTAL, CKMB, CKMBINDEX, TROPONINI in the last 168 hours.  BNP: Invalid input(s): POCBNP  CBG:  Recent Labs Lab 11/09/15 1250  GLUCAP 116*    Microbiology: No results found for this or any previous visit.  Coagulation Studies: No results for input(s): LABPROT, INR in the last 72 hours.  Urinalysis:  Recent Labs Lab 11/09/15 1400  COLORURINE YELLOW  LABSPEC 1.011  PHURINE 5.0  GLUCOSEU NEGATIVE  HGBUR NEGATIVE  BILIRUBINUR NEGATIVE  KETONESUR NEGATIVE  PROTEINUR NEGATIVE  NITRITE NEGATIVE  LEUKOCYTESUR NEGATIVE    Lipid Panel:  No results found for: CHOL, TRIG, HDL, CHOLHDL, VLDL, LDLCALC  HgbA1C: No results found for: HGBA1C  Urine Drug Screen:  No results found for: LABOPIA, COCAINSCRNUR, LABBENZ, AMPHETMU, THCU, LABBARB  Alcohol Level: No results for input(s): ETH in the last 168 hours.   Imaging: Ct Head Wo Contrast  11/09/2015  CLINICAL DATA:  Patient reports trouble walking and getting up from a chair. LEFT arm weakness. This is of uncertain onset, possibly 2 days ago. EXAM: CT HEAD WITHOUT CONTRAST TECHNIQUE: Contiguous axial images were obtained from the base of the skull through the vertex without intravenous contrast. COMPARISON:  10/25/2012. FINDINGS: No evidence for acute infarction, hemorrhage, mass lesion, hydrocephalus, or extra-axial fluid. Normal for age cerebral  volume. Hypoattenuation of white matter suggesting small vessel disease. No signs of large vessel occlusion. Calvarium intact. No sinus or mastoid disease. BILATERAL cataract extraction. Stable appearance from priors. IMPRESSION: Chronic changes related to age. No acute intracranial findings. No findings suggestive of a cortical or lacunar infarction accounting for left-sided weakness. Electronically Signed   By: Elsie StainJohn T Curnes M.D.   On: 11/09/2015 15:00    Assessment: 81 y.o. female hx of HLD presenting with sudden onset of gait instability and left sided weakness. Concern for possible CVA. Admitted for further workup.   Plan: 1. HgbA1c, fasting lipid panel 2. MRI, MRA  of the brain without contrast 3. PT consult, OT consult, Speech consult 4. Echocardiogram 5. Carotid dopplers 6. Prophylactic therapy-ASA 325mg  7. Risk factor modification 8. Telemetry monitoring 9. Frequent neuro checks 10. NPO until RN stroke swallow screen   Elspeth Choeter Stephenson Cichy, DO [facility] [phone]  If 7pm- 7am, please page neurology on call as listed in [facility]. 11/09/2015, 7:36 PM

## 2015-11-09 NOTE — H&P (Signed)
[facility] History and Physical  Cheron Everyva C [place]ineau WUJ:[id number] DOB: 1920/08/22 DOA: 11/09/2015   PCP: Ezequiel KayserPERINI,MARK A, MD    Chief Complaint: Trouble with her left hand  HPI: Madison Maxwell is a 81 y.o. female with a history of hyperlipidemia, memory deficits and osteoporosis who presents with trouble using her left hand. She states that she noticed this morning while she was trying to eat some bread that she had trouble with coordination specifically with her left hand. She does not complaining of any numbness or focal weakness. She has not had any change in vision or speech. She does mention that she has been off balance but this has been steadily progressing for the past 6 months and has not happened more rapidly than that. She has no other complaints and is being admitted for a neurology workup.    General: The patient denies anorexia, fever, weight loss Cardiac: Denies chest pain, syncope, palpitations, pedal edema  Respiratory: Denies , shortness of breath, wheezing- cough this past week is getting better GI: Denies severe indigestion/heartburn, abdominal pain, nausea, vomiting, diarrhea and constipation GU: Denies hematuria, incontinence, dysuria  Musculoskeletal:  + arthritis in thumb Skin: Denies suspicious skin lesions Neurologic: Denies focal weakness or numbness, change in vision Psychiatry: Denies depression or anxiety. Hematologic: + easy bruising   All other systems reviewed and found to be negative.  Past Medical History  Diagnosis Date  . Hyperlipidemia   . Memory deficit   . Allergic rhinitis   . Duodenal ulcer   . External hemorrhoid   . Anxiety   . SUI (stress urinary incontinence), female   . Osteoarthritis   . Osteoporosis   . Vitamin D deficiency     Past Surgical History  Procedure Laterality Date  . Vaginal hysterectomy    . Appendectomy      Social History: does not smoke or drink alcohol Lives at home with husband- does all ADLs    No  Known Allergies  Family history:   Family History  Problem Relation Age of Onset  . Colon cancer Neg Hx   . Colon polyps Sister       Prior to Admission medications   Medication Sig Start Date End Date Taking? Authorizing Provider  aspirin 81 MG chewable tablet Chew 81 mg by mouth daily.   Yes Historical Provider, MD  denosumab (PROLIA) 60 MG/ML SOLN injection Inject 60 mg into the skin every 6 (six) months. Administer in upper arm, thigh, or abdomen   Yes Historical Provider, MD  Multiple Vitamins-Minerals (MULTIVITAMINS THER. W/MINERALS) TABS Take 1 tablet by mouth daily.   Yes Historical Provider, MD  omega-3 acid ethyl esters (LOVAZA) 1 G capsule Take 2 g by mouth 2 (two) times daily.   Yes Historical Provider, MD  rivastigmine (EXELON) 9.5 mg/24hr Place 9.5 mg onto the skin daily.  08/27/15  Yes Historical Provider, MD  vitamin B-12 (CYANOCOBALAMIN) 1000 MCG tablet Take 1,000 mcg by mouth daily.   Yes Historical Provider, MD  vitamin C (ASCORBIC ACID) 500 MG tablet Take 500 mg by mouth daily.   Yes Historical Provider, MD  zolpidem (AMBIEN) 5 MG tablet Take 5 mg by mouth at bedtime as needed. sleep 10/12/15  Yes Historical Provider, MD     Physical Exam: Filed Vitals:   11/09/15 1449 11/09/15 1500 11/09/15 1530 11/09/15 1630  BP: 124/63 125/61 127/63 155/73  Pulse: 76 68 71 84  Temp:      TempSrc:      Resp:  SpO2: 98% 96% 98% 100%     General: Elderly female sitting up in bed in no acute distress HEENT: Normocephalic and Atraumatic, Mucous membranes pink                PERRLA; EOM intact; No scleral icterus,                 Nares: Patent, Oropharynx: Clear, Fair Dentition                 Neck: FROM, no cervical lymphadenopathy, thyromegaly, carotid bruit or JVD;  Breasts: deferred CHEST WALL: No tenderness  CHEST: Normal respiration, clear to auscultation bilaterally  HEART: Regular rate and rhythm; no murmurs rubs or gallops  BACK: No kyphosis or  scoliosis; no CVA tenderness  GI: Positive Bowel Sounds, soft, non-tender; no masses, no organomegaly Rectal Exam: deferred MSK: No cyanosis, clubbing, or edema Genitalia: not examined  SKIN:  no rash or ulceration  CNS: Alert and Oriented x 4, CN 2-12 intact- dysmetria with left hand-strength is otherwise intact  Labs on Admission:  Basic Metabolic Panel:  Recent Labs Lab 11/09/15 1352  NA 141  K 4.0  CL 103  CO2 31  GLUCOSE 149*  BUN 18  CREATININE 0.64  CALCIUM 9.2   Liver Function Tests: No results for input(s): AST, ALT, ALKPHOS, BILITOT, PROT, ALBUMIN in the last 168 hours. No results for input(s): LIPASE, AMYLASE in the last 168 hours. No results for input(s): AMMONIA in the last 168 hours. CBC:  Recent Labs Lab 11/09/15 1352  WBC 5.1  HGB 11.7*  HCT 35.7*  MCV 94.4  PLT 132*   Cardiac Enzymes: No results for input(s): CKTOTAL, CKMB, CKMBINDEX, TROPONINI in the last 168 hours.  BNP (last 3 results) No results for input(s): BNP in the last 8760 hours.  ProBNP (last 3 results) No results for input(s): PROBNP in the last 8760 hours.  CBG:  Recent Labs Lab 11/09/15 1250  GLUCAP 116*    Radiological Exams on Admission: Ct Head Wo Contrast  11/09/2015  CLINICAL DATA:  Patient reports trouble walking and getting up from a chair. LEFT arm weakness. This is of uncertain onset, possibly 2 days ago. EXAM: CT HEAD WITHOUT CONTRAST TECHNIQUE: Contiguous axial images were obtained from the base of the skull through the vertex without intravenous contrast. COMPARISON:  10/25/2012. FINDINGS: No evidence for acute infarction, hemorrhage, mass lesion, hydrocephalus, or extra-axial fluid. Normal for age cerebral volume. Hypoattenuation of white matter suggesting small vessel disease. No signs of large vessel occlusion. Calvarium intact. No sinus or mastoid disease. BILATERAL cataract extraction. Stable appearance from priors. IMPRESSION: Chronic changes related to age. No  acute intracranial findings. No findings suggestive of a cortical or lacunar infarction accounting for left-sided weakness. Electronically Signed   By: Elsie Stain M.D.   On: 11/09/2015 15:00    EKG: Independently reviewed. Normal sinus rhythm  Assessment/Plan Principal Problem:   CVA (cerebral infarction) - Dysmetria with left hand-obtain MRI, carotid duplex, 2-D echo, A1c and lipid profile -Will need to be transferred to [facility] -Neuro checks ordered -Neurology will evaluate her when she is [facility]  Active Problems:   Memory deficit - continue Exelon patch  Consulted: Neuro consulted by ER  Code Status: DNR  Family Communication: husband at bedside  DVT Prophylaxis:Lovenox  Time spent: 79  Braylon Grenda, MD [facility]  If 7PM-7AM, please contact night-coverage [email or web address] 11/09/2015, 4:57 PM

## 2015-11-09 NOTE — ED Notes (Signed)
MD at bedside. 

## 2015-11-09 NOTE — ED Notes (Signed)
Attempted to call report to [facility]. Informed that nurse will have to call back for report. [facility] here to transport patient, informed of same.

## 2015-11-09 NOTE — ED Notes (Signed)
Patient able to ambulate from triage back to her room without assistance.

## 2015-11-09 NOTE — ED Notes (Signed)
[facility]. Informed nurse is in a code and cannot take report at this time.

## 2015-11-09 NOTE — ED Notes (Signed)
Pt gone to CT 

## 2015-11-09 NOTE — ED Notes (Signed)
Pt to CT

## 2015-11-09 NOTE — ED Provider Notes (Signed)
CSN: [id number]     Arrival date & time 11/09/15  1243 History   First MD Initiated Contact with Patient 11/09/15 1404     Chief Complaint  Patient presents with  . Weakness, Difficulty Walking      (Consider location/radiation/quality/duration/timing/severity/associated sxs/prior Treatment) HPI 81 year old female who presents with gait instability and left hand weakness. Has a history of hyperlipidemia. States that 3 days ago she developed sudden onset of gait instability, where she states that she has to hold onto things in order to walk without losing her equilibrium. States that 1-1/2 days ago felt weak in her left hand, where she is having difficulties grasping onto objects and food. States that she is right-hand dominant, but normally does not have difficulty using her left hand like this. Denies vision changes, speech changes, numbness, weakness in her legs. States that she otherwise has been in her usual state of health. Has not had any fevers, chills, difficulty breathing, chest pain or back pain, abdominal pain, nausea or vomiting, or urinary complaints.  Past Medical History  Diagnosis Date  . Hyperlipidemia   . Memory deficit   . Allergic rhinitis   . Duodenal ulcer   . External hemorrhoid   . Anxiety   . SUI (stress urinary incontinence), female   . Osteoarthritis   . Osteoporosis   . Vitamin D deficiency    Past Surgical History  Procedure Laterality Date  . Vaginal hysterectomy    . Appendectomy     Family History  Problem Relation Age of Onset  . Colon cancer Neg Hx   . Colon polyps Sister    Social History  Substance Use Topics  . Smoking status: Never Smoker   . Smokeless tobacco: Never Used  . Alcohol Use: No   OB History    No data available     Review of Systems 10/14 systems reviewed and are negative other than those stated in the HPI   Allergies  Review of patient's allergies indicates no known allergies.  Home Medications   Prior to  Admission medications   Medication Sig Start Date End Date Taking? Authorizing Provider  aspirin 81 MG chewable tablet Chew 81 mg by mouth daily.   Yes Historical Provider, MD  denosumab (PROLIA) 60 MG/ML SOLN injection Inject 60 mg into the skin every 6 (six) months. Administer in upper arm, thigh, or abdomen   Yes Historical Provider, MD  Multiple Vitamins-Minerals (MULTIVITAMINS THER. W/MINERALS) TABS Take 1 tablet by mouth daily.   Yes Historical Provider, MD  omega-3 acid ethyl esters (LOVAZA) 1 G capsule Take 2 g by mouth 2 (two) times daily.   Yes Historical Provider, MD  rivastigmine (EXELON) 9.5 mg/24hr Place 9.5 mg onto the skin daily.  08/27/15  Yes Historical Provider, MD  vitamin B-12 (CYANOCOBALAMIN) 1000 MCG tablet Take 1,000 mcg by mouth daily.   Yes Historical Provider, MD  vitamin C (ASCORBIC ACID) 500 MG tablet Take 500 mg by mouth daily.   Yes Historical Provider, MD  zolpidem (AMBIEN) 5 MG tablet Take 5 mg by mouth at bedtime as needed. sleep 10/12/15  Yes Historical Provider, MD   BP 151/82 mmHg  Pulse 89  Temp(Src) 97.8 F (36.6 C) (Oral)  Resp 17  SpO2 99% Physical Exam Physical Exam  Nursing note and vitals reviewed. Constitutional: Well developed, well nourished, non-toxic, and in no acute distress Head: Normocephalic and atraumatic.  Mouth/Throat: Oropharynx is clear and moist.  Neck: Normal range of motion. Neck supple.  Cardiovascular:  Normal rate and regular rhythm.   Pulmonary/Chest: Effort normal and breath sounds normal.  Abdominal: Soft. There is no tenderness. There is no rebound and no guarding.  Musculoskeletal: No deformities. Skin: Skin is warm and dry.  Psychiatric: Cooperative Neurological:  Alert, oriented to person, place, time, and situation. Memory grossly in tact. Fluent speech. No dysarthria or aphasia.  Cranial nerves: VF are full. Pupils are symmetric, and reactive to light. EOMI without nystagmus. No gaze deviation. Facial muscles  symmetric with activation. Sensation to light touch over face in tact bilaterally. Hearing grossly in tact. Palate elevates symmetrically. Head turn and shoulder shrug are intact. Tongue midline.  Reflexes defered.  Muscle bulk normal. Full strength against gravity of all four extremities. Left hand fingers with mild decreased grip strength.  Sensation to light touch is in tact throughout in bilateral upper and lower extremities. Coordination reveals dysmetria with finger to nose in LUE. Normal finger to nose on the right.  Gait is unsteady, requiring holding onto bed/beside table for support   ED Course  Procedures (including critical care time) Labs Review Labs Reviewed  BASIC METABOLIC PANEL - Abnormal; Notable for the following:    Glucose, Bld 149 (*)    All other components within normal limits  CBC - Abnormal; Notable for the following:    RBC 3.78 (*)    Hemoglobin 11.7 (*)    HCT 35.7 (*)    Platelets 132 (*)    All other components within normal limits  CBG MONITORING, ED - Abnormal; Notable for the following:    Glucose-Capillary 116 (*)    All other components within normal limits  URINALYSIS, ROUTINE W REFLEX MICROSCOPIC (NOT AT [facility])  CBC  CREATININE, SERUM  HEMOGLOBIN A1C  LIPID PANEL  CBG MONITORING, ED    Imaging Review Ct Head Wo Contrast  11/09/2015  CLINICAL DATA:  Patient reports trouble walking and getting up from a chair. LEFT arm weakness. This is of uncertain onset, possibly 2 days ago. EXAM: CT HEAD WITHOUT CONTRAST TECHNIQUE: Contiguous axial images were obtained from the base of the skull through the vertex without intravenous contrast. COMPARISON:  10/25/2012. FINDINGS: No evidence for acute infarction, hemorrhage, mass lesion, hydrocephalus, or extra-axial fluid. Normal for age cerebral volume. Hypoattenuation of white matter suggesting small vessel disease. No signs of large vessel occlusion. Calvarium intact. No sinus or mastoid disease. BILATERAL  cataract extraction. Stable appearance from priors. IMPRESSION: Chronic changes related to age. No acute intracranial findings. No findings suggestive of a cortical or lacunar infarction accounting for left-sided weakness. Electronically Signed   By: Elsie Stain M.D.   On: 11/09/2015 15:00   I have personally reviewed and evaluated these images and lab results as part of my medical decision-making.   EKG Interpretation   Date/Time:  Friday November 09 2015 14:49:06 EST Ventricular Rate:  74 PR Interval:  129 QRS Duration: 86 QT Interval:  407 QTC Calculation: 451 R Axis:   71 Text Interpretation:  Sinus rhythm Probable left atrial enlargement Left  ventricular hypertrophy Borderline T abnormalities, lateral leads No  significant change since last tracing Confirmed by Selby Slovacek MD, Annabelle Harman ([id number]) on  11/09/2015 3:19:19 PM      MDM   Final diagnoses:  Ataxia  Left hand weakness    81 year old female who presents with gait instability and left hand weakness over past few days. VS stable on arrival and she is in no acute distress. Unable to ambulate steadily without holding on to nearby objects  on exam, and with [facility] to [facility] on the left side. C/f CVA. CT head showing no acute intracranial processes. Discussed with Dr. Amada JupiterKirkpatrick from neurology who recommended admission for neuro evaluation given neuro findings. MRI ordered and pending. Discussed with Dr. Butler Denmarkizwan who will admit for CVA work-up.    Lavera Guiseana Duo Makia Bossi, MD 11/09/15 [phone]

## 2015-11-09 NOTE — ED Notes (Signed)
Nurse for [facility] 04 informed [facility].

## 2015-11-09 NOTE — ED Notes (Addendum)
Pt reports "trouble walking and getting up from a chair" x3 days, today started having left arm weakness, denies pain and numbness. No hx of stroke. Not on blood thinners. Pt thinks left arm weakness started at 0800, husband reports he noticed it around 1200.  md Liu at bedside, pt told md that her arm weakness has been going on 2 days, and leg weakness for a little longer. No code stroke being called.

## 2015-11-10 ENCOUNTER — Observation Stay (HOSPITAL_COMMUNITY): Payer: Medicare Other

## 2015-11-10 ENCOUNTER — Encounter (HOSPITAL_COMMUNITY): Payer: Self-pay | Admitting: General Practice

## 2015-11-10 ENCOUNTER — Observation Stay (HOSPITAL_BASED_OUTPATIENT_CLINIC_OR_DEPARTMENT_OTHER): Payer: Medicare Other

## 2015-11-10 DIAGNOSIS — R262 Difficulty in walking, not elsewhere classified: Secondary | ICD-10-CM | POA: Diagnosis present

## 2015-11-10 DIAGNOSIS — I63429 Cerebral infarction due to embolism of unspecified anterior cerebral artery: Secondary | ICD-10-CM

## 2015-11-10 DIAGNOSIS — R270 Ataxia, unspecified: Secondary | ICD-10-CM | POA: Insufficient documentation

## 2015-11-10 DIAGNOSIS — I6789 Other cerebrovascular disease: Secondary | ICD-10-CM | POA: Diagnosis not present

## 2015-11-10 DIAGNOSIS — Z66 Do not resuscitate: Secondary | ICD-10-CM | POA: Diagnosis present

## 2015-11-10 DIAGNOSIS — R278 Other lack of coordination: Secondary | ICD-10-CM | POA: Diagnosis present

## 2015-11-10 DIAGNOSIS — R29700 NIHSS score 0: Secondary | ICD-10-CM | POA: Diagnosis present

## 2015-11-10 DIAGNOSIS — R402252 Coma scale, best verbal response, oriented, at arrival to emergency department: Secondary | ICD-10-CM | POA: Diagnosis present

## 2015-11-10 DIAGNOSIS — M6289 Other specified disorders of muscle: Secondary | ICD-10-CM | POA: Diagnosis not present

## 2015-11-10 DIAGNOSIS — I639 Cerebral infarction, unspecified: Secondary | ICD-10-CM

## 2015-11-10 DIAGNOSIS — E785 Hyperlipidemia, unspecified: Secondary | ICD-10-CM | POA: Diagnosis not present

## 2015-11-10 DIAGNOSIS — Z7982 Long term (current) use of aspirin: Secondary | ICD-10-CM | POA: Diagnosis not present

## 2015-11-10 DIAGNOSIS — R402362 Coma scale, best motor response, obeys commands, at arrival to emergency department: Secondary | ICD-10-CM | POA: Diagnosis present

## 2015-11-10 DIAGNOSIS — I638 Other cerebral infarction: Secondary | ICD-10-CM | POA: Diagnosis not present

## 2015-11-10 DIAGNOSIS — R4189 Other symptoms and signs involving cognitive functions and awareness: Secondary | ICD-10-CM | POA: Diagnosis present

## 2015-11-10 DIAGNOSIS — R413 Other amnesia: Secondary | ICD-10-CM

## 2015-11-10 DIAGNOSIS — R402142 Coma scale, eyes open, spontaneous, at arrival to emergency department: Secondary | ICD-10-CM | POA: Diagnosis present

## 2015-11-10 DIAGNOSIS — I6340 Cerebral infarction due to embolism of unspecified cerebral artery: Secondary | ICD-10-CM | POA: Diagnosis present

## 2015-11-10 DIAGNOSIS — G8194 Hemiplegia, unspecified affecting left nondominant side: Secondary | ICD-10-CM | POA: Diagnosis present

## 2015-11-10 DIAGNOSIS — I10 Essential (primary) hypertension: Secondary | ICD-10-CM | POA: Diagnosis present

## 2015-11-10 LAB — LIPID PANEL
CHOL/HDL RATIO: 4.6 ratio
Cholesterol: 193 mg/dL (ref 0–200)
HDL: 42 mg/dL (ref >40)
LDL CALC: 139 mg/dL — AB (ref 0–99)
Triglycerides: 59 mg/dL (ref <150)
VLDL: 12 mg/dL (ref 0–40)

## 2015-11-10 MED ORDER — ASPIRIN EC 325 MG PO TBEC
325.0000 mg | DELAYED_RELEASE_TABLET | Freq: Every day | ORAL | Status: DC
  Administered 2015-11-10 – 2015-11-12 (×3): 325 mg via ORAL
  Filled 2015-11-10 (×3): qty 1

## 2015-11-10 MED ORDER — ATORVASTATIN CALCIUM 10 MG PO TABS
20.0000 mg | ORAL_TABLET | Freq: Every day | ORAL | Status: DC
  Administered 2015-11-10 – 2015-11-11 (×2): 20 mg via ORAL
  Filled 2015-11-10 (×2): qty 2

## 2015-11-10 NOTE — Progress Notes (Signed)
[facility] PROGRESS NOTE  Cheron Everyva C Player ZOX:[id number] DOB: January 24, 1920 DOA: 11/09/2015 PCP:  KayserPERINI,MARK A, MD  Assessment/Plan: 1. Probable acute CVA. -[name] is a pleasant 81 year old female with a history of dyslipidemia presenting with complaints of left arm weakness associate with paresthesias. -CT scan of brain did not reveal acute intracranial abnormalities. -Suspect acute CVA. She is further being worked up with MRI of brain, carotid Dopplers and transthoracic echocardiogram. -Neurology consulted. -Continue aspirin 325 mg by mouth daily and atorvastatin 20 mg by mouth daily.  2.  Dyslipidemia. -A.m. lab work showing LDL of 439 with total cholesterol of 193 and triglyceride level of 59. -Continue statin therapy with Lipitor 20 mg by mouth daily  3.  Cognitive impairment -Currently stable, function at her baseline. Continue Exelon  Code Status: DNR Family Communication:  Disposition Plan: Continue CVA workup   Consultants:  Neurology   HPI/Subjective: [name] is a pleasant 81 year old female with a past medical history of dyslipidemia, cognitive impairment, presented to the emergency department on 11/09/2015 with complaints of left hand weakness associated with numbness. She reported that symptoms have been present over the past 3 days. Initial workup included a CT scan of brain that did not show acute intracranial findings. She was seen and evaluated by neurology and placed on the CVA protocol.  Objective: Filed Vitals:   11/10/15 0700 11/10/15 0927  BP: 131/54 128/64  Pulse: 77 81  Temp: 98.2 F (36.8 C) 98.4 F (36.9 C)  Resp: 18 18   No intake or output data in the 24 hours ending 11/10/15 1228 There were no vitals filed for this visit.  Exam:   General:  Patient is in no acute distress, she is sitting reading her newspaper.  Cardiovascular: Regular rate and rhythm normal S1-S2 no murmurs rubs or gallops  Respiratory: Normal  respiratory effort, lungs are clear to auscultation bilaterally  Abdomen: Soft nontender nondistended  Musculoskeletal: No edema, sarcopenic  Neurological: Cranial nerves II through XII grossly intact, no slurred speech or facial droop. She had 3-4/5 muscle strength to her left upper extremity, decreased grip strength. There is decreased sensation to left extremity as well. 5 of 5 muscle strength to bilateral lower extremities and right upper extremity.  Data Reviewed: Basic Metabolic Panel:  Recent Labs Lab 11/09/15 1352 11/09/15 2025  NA 141  --   K 4.0  --   CL 103  --   CO2 31  --   GLUCOSE 149*  --   BUN 18  --   CREATININE 0.64 0.51  CALCIUM 9.2  --    Liver Function Tests: No results for input(s): AST, ALT, ALKPHOS, BILITOT, PROT, ALBUMIN in the last 168 hours. No results for input(s): LIPASE, AMYLASE in the last 168 hours. No results for input(s): AMMONIA in the last 168 hours. CBC:  Recent Labs Lab 11/09/15 1352 11/09/15 2025  WBC 5.1 5.3  HGB 11.7* 12.0  HCT 35.7* 36.8  MCV 94.4 94.8  PLT 132* 132*   Cardiac Enzymes: No results for input(s): CKTOTAL, CKMB, CKMBINDEX, TROPONINI in the last 168 hours. BNP (last 3 results) No results for input(s): BNP in the last 8760 hours.  ProBNP (last 3 results) No results for input(s): PROBNP in the last 8760 hours.  CBG:  Recent Labs Lab 11/09/15 1250  GLUCAP 116*    No results found for this or any previous visit (from the past 240 hour(s)).   Studies: Ct Head Wo Contrast  11/09/2015  CLINICAL DATA:  Patient  reports trouble walking and getting up from a chair. LEFT arm weakness. This is of uncertain onset, possibly 2 days ago. EXAM: CT HEAD WITHOUT CONTRAST TECHNIQUE: Contiguous axial images were obtained from the base of the skull through the vertex without intravenous contrast. COMPARISON:  10/25/2012. FINDINGS: No evidence for acute infarction, hemorrhage, mass lesion, hydrocephalus, or extra-axial fluid.  Normal for age cerebral volume. Hypoattenuation of white matter suggesting small vessel disease. No signs of large vessel occlusion. Calvarium intact. No sinus or mastoid disease. BILATERAL cataract extraction. Stable appearance from priors. IMPRESSION: Chronic changes related to age. No acute intracranial findings. No findings suggestive of a cortical or lacunar infarction accounting for left-sided weakness. Electronically Signed   By: Elsie Stain M.D.   On: 11/09/2015 15:00    Scheduled Meds: .  stroke: mapping our early stages of recovery book   Does not apply Once  . aspirin EC  325 mg Oral Daily  . atorvastatin  20 mg Oral q1800  . enoxaparin (LOVENOX) injection  40 mg Subcutaneous Q24H  . rivastigmine  9.5 mg Transdermal Daily  . zolpidem  5 mg Oral QHS   Continuous Infusions:   Principal Problem:   CVA (cerebral infarction) Active Problems:   Memory deficit    Time spent: 25 min    Jeralyn Bennett  [facility] Pager [phone]. If 7PM-7AM, please contact night-coverage at [email or web address], password [facility] 11/10/2015, 12:28 PM

## 2015-11-10 NOTE — Progress Notes (Signed)
VASCULAR LAB PRELIMINARY  PRELIMINARY  PRELIMINARY  PRELIMINARY  Carotid duplex completed.    Preliminary report:  1-39% ICA plaquing.  Vertebral artery flow is antegrade.   Paylin Hailu, RVT 11/10/2015, 4:30 PM

## 2015-11-10 NOTE — Evaluation (Signed)
Physical Therapy Evaluation Patient Details Name: Madison Maxwell MRN: [id number] DOB: 1920-05-09 Today's Date: 11/10/2015   History of Present Illness  Patient is 81 yo married female with 3 day h/o gait disfficulty and Lt hand weakness.  At current time, no acute findings.  She has been admitted for further medical workup.  Clinical Impression  Patient declines DME for home.  She is hopeful that she will discharge home today with husband. There is also supportive family nearby that is available intermittently.  Patient also declines therapies after discharge although she may benefit from short term to ensure safe transition home and to incr mobility back to baseline.  Will continue to follow patient while on this venue of care to progress mobility if she does not discharge home today, pending test results.    Follow Up Recommendations Home health PT;Supervision for mobility/OOB    Equipment Recommendations  Rolling walker with 5" wheels;3in1 (PT);Other (comment) (pt declined equipment)    Recommendations for Other Services       Precautions / Restrictions Precautions Precautions: Fall Precaution Comments: pt reports her legs feel weak and she reaches for support Restrictions Weight Bearing Restrictions: No      Mobility  Bed Mobility Overal bed mobility: Independent             General bed mobility comments: supine to sit  Transfers Overall transfer level: Needs assistance Equipment used: None Transfers: Sit to/from Stand Sit to Stand: Supervision         General transfer comment: for general safety purposes  Ambulation/Gait Ambulation/Gait assistance: Min assist Ambulation Distance (Feet): 100 Feet Assistive device: 1 person hand held assist Gait Pattern/deviations: Shuffle;Trunk flexed (decr knee ext in stance phase bil)     General Gait Details: she denied use of RW and requested bil HHA, allowed pt to hold her husband Lt hand to assess gait  capabilities.  Stairs            Wheelchair Mobility    Modified Rankin (Stroke Patients Only) Modified Rankin (Stroke Patients Only) Pre-Morbid Rankin Score: No symptoms Modified Rankin: No significant disability     Balance Overall balance assessment: Needs assistance Sitting-balance support: No upper extremity supported;Feet supported Sitting balance-Leahy Scale: Good     Standing balance support: Bilateral upper extremity supported Standing balance-Leahy Scale: Fair Standing balance comment: pt reaching for support during standing tasks                             Pertinent Vitals/Pain Pain Assessment: No/denies pain    Home Living Family/patient expects to be discharged to:: Private residence Living Arrangements: Spouse/significant other Available Help at Discharge: Family;Available 24 hours/day Type of Home: House Home Access: Stairs to enter Entrance Stairs-Rails: None Entrance Stairs-Number of Steps: 1 Home Layout: One level Home Equipment: Wheelchair - Careers advisermanual;Other (comment) (may be able to borrow from family or church)      Prior Function Level of Independence: Independent         Comments: has previously elected to not drive     Hand Dominance   Dominant Hand: Right    Extremity/Trunk Assessment   Upper Extremity Assessment: Overall WFL for tasks assessed           Lower Extremity Assessment: Overall WFL for tasks assessed;Generalized weakness      Cervical / Trunk Assessment: Kyphotic  Communication   Communication: No difficulties  Cognition Arousal/Alertness: Awake/alert Behavior During Therapy: WFL for tasks  assessed/performed Overall Cognitive Status: Within Functional Limits for tasks assessed                      General Comments General comments (skin integrity, edema, etc.): pt highly motivated to return home    Exercises General Exercises - Lower Extremity Hip Flexion/Marching: AROM;Both;10  reps;Standing Toe Raises: AROM;Both;10 reps;Standing Heel Raises: AROM;Both;10 reps;Standing Mini-Sqauts: AROM;Both;10 reps;Standing      Assessment/Plan    PT Assessment Patient needs continued PT services  PT Diagnosis Difficulty walking;Generalized weakness   PT Problem List Decreased strength;Decreased activity tolerance;Decreased balance;Decreased mobility;Decreased knowledge of use of DME  PT Treatment Interventions DME instruction;Gait training;Stair training;Functional mobility training;Therapeutic activities;Therapeutic exercise;Balance training;Neuromuscular re-education;Patient/family education   PT Goals (Current goals can be found in the Care Plan section) Acute Rehab PT Goals Patient Stated Goal: return home today PT Goal Formulation: With patient/family Time For Goal Achievement: 11/17/15 Potential to Achieve Goals: Good    Frequency Min 3X/week   Barriers to discharge        Co-evaluation               End of Session Equipment Utilized During Treatment: Gait belt Activity Tolerance: Patient tolerated treatment well;Other (comment) (gait distances shortened as tech ready to take pt to MRI) Patient left: in chair;with nursing/sitter in room;Other (comment) (placed in w/c for transport to MRI) Nurse Communication: Mobility status;Precautions    Functional Assessment Tool Used: transfers, gait distances Functional Limitation: Mobility: Walking and moving around Mobility: Walking and Moving Around Current Status (Z6109): At least 20 percent but less than 40 percent impaired, limited or restricted Mobility: Walking and Moving Around Goal Status [phone]): At least 1 percent but less than 20 percent impaired, limited or restricted Mobility: Walking and Moving Around Discharge Status [phone]): At least 20 percent but less than 40 percent impaired, limited or restricted    Time: 1120-1136 PT Time Calculation (min) (ACUTE ONLY): 16 min   Charges:         PT G  Codes:   PT G-Codes **NOT FOR INPATIENT CLASS** Functional Assessment Tool Used: transfers, gait distances Functional Limitation: Mobility: Walking and moving around Mobility: Walking and Moving Around Current Status (B1478): At least 20 percent but less than 40 percent impaired, limited or restricted Mobility: Walking and Moving Around Goal Status [phone]): At least 1 percent but less than 20 percent impaired, limited or restricted Mobility: Walking and Moving Around Discharge Status [phone]): At least 20 percent but less than 40 percent impaired, limited or restricted   Nestor Lewandowsky, [place] [phone]  Madison Maxwell 11/10/2015, 1:32 PM

## 2015-11-10 NOTE — Progress Notes (Signed)
  Echocardiogram 2D Echocardiogram has been performed.  Madison SavoyCasey N Aviv Maxwell 11/10/2015, 9:35 AM

## 2015-11-10 NOTE — Progress Notes (Signed)
STROKE TEAM PROGRESS NOTE   HISTORY Madison Maxwell is an 81 y.o. female hx of HLD and memory deficits presented with difficulty using her left hand and gait instability. She reports 3 days ago she developed sudden onset of gait instability, trouble walking. Around 1.5 days ago also noted weakness in her left hand, trouble doing coordinated tasks. Symptoms did not resolve so she came to the ED. No prior CVA or TIA. Takes ASA 81mg  daily at home.   CT head imaging reviewed and no acute process noted.   Date last known well: 12/06 Time last known well: unclear tPA Given: no, outside tPA window Modified Rankin: Rankin Score=1   SUBJECTIVE (INTERVAL HISTORY) Her husband is at the bedside.  Overall she feels her condition is rapidly improving.    OBJECTIVE Temp:  [97.8 F (36.6 C)-98.3 F (36.8 C)] 98 F (36.7 C) (12/10 0452) Pulse Rate:  [68-89] 77 (12/10 0452) Cardiac Rhythm:  [-] Normal sinus rhythm (12/09 2022) Resp:  [13-25] 18 (12/10 0452) BP: (106-157)/(59-82) 139/70 mmHg (12/10 0452) SpO2:  [95 %-100 %] 98 % (12/10 0452)  CBC:  Recent Labs Lab 11/09/15 1352 11/09/15 2025  WBC 5.1 5.3  HGB 11.7* 12.0  HCT 35.7* 36.8  MCV 94.4 94.8  PLT 132* 132*    Basic Metabolic Panel:  Recent Labs Lab 11/09/15 1352 11/09/15 2025  NA 141  --   K 4.0  --   CL 103  --   CO2 31  --   GLUCOSE 149*  --   BUN 18  --   CREATININE 0.64 0.51  CALCIUM 9.2  --     Lipid Panel:    Component Value Date/Time   CHOL 193 11/10/2015 0448   TRIG 59 11/10/2015 0448   HDL 42 11/10/2015 0448   CHOLHDL 4.6 11/10/2015 0448   VLDL 12 11/10/2015 0448   LDLCALC 139* 11/10/2015 0448   HgbA1c: No results found for: HGBA1C Urine Drug Screen: No results found for: LABOPIA, COCAINSCRNUR, LABBENZ, AMPHETMU, THCU, LABBARB    IMAGING  Ct Head Wo Contrast 11/09/2015   Chronic changes related to age. No acute intracranial findings. No findings suggestive of a cortical or lacunar infarction  accounting for left-sided weakness.   PHYSICAL EXAM General - Well nourished, well developed, in NAD   Cardiovascular - Regular rate and rhythm Pulmonary: CTA Abdomen: NT, ND, normal bowel sounds Extremities: No C/C/E  Neurological Exam Mental Status: Normal Orientation:  Oriented to person, place and time Speech:  Fluent; no dysarthria  Cranial Nerves:  PERRL; EOMI; visual fields full, face grossly symmetric, hearing grossly intact; shrug symmetric and tongue midline  Motor Exam:  Tone:  Within normal limits; Strength: 5/5 throughout right; left arm drift 4+/5 strength on left  Sensory: Left arm and leg numb  Coordination:  Intact finger to nose  Gait: Deferred  ASSESSMENT/PLAN [name] is a 81 y.o. female with history of hyperlipidemia, memory deficits, anxiety, and duodonal ulcer, presenting with gait instability and difficulty using her left hand.. 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  Patient counseled to be compliant with her antithrombotic medications  Ongoing aggressive stroke risk factor management  Therapy recommendations: Pending  Disposition:  Pending  Hypertension  Stable  Permissive hypertension (OK if < 220/120) but gradually normalize in 5-7 days  Hyperlipidemia  Home meds:  No lipid lowering medications prior to admission  LDL 139, goal < 70  Add lipitor 20 mg daily  Continue statin at discharge   Other Stroke Risk Factors  Advanced age  Obesity, There is no weight on file to calculate BMI.     Other Active Problems  Mild thrombocytopenia  ATTENDING NOTE: Patient was seen and examined by me personally. Documentation accurately reflects findings. The laboratory and radiographic studies reviewed by me. ROS completed by me personally.  Complains of left side numbness and imbalance.   Assessment and plan completed by me personally and fully documented above. Plans include:   Stroke work up pending Will follow  SIGNED BY: Dr. Sula Soda     To contact Stroke Continuity provider, please refer to [email or web address]. After hours, contact General Neurology

## 2015-11-11 DIAGNOSIS — M6289 Other specified disorders of muscle: Secondary | ICD-10-CM

## 2015-11-11 DIAGNOSIS — R29898 Other symptoms and signs involving the musculoskeletal system: Secondary | ICD-10-CM | POA: Insufficient documentation

## 2015-11-11 LAB — BASIC METABOLIC PANEL
ANION GAP: 6 (ref 5–15)
BUN: 18 mg/dL (ref 6–20)
CO2: 30 mmol/L (ref 22–32)
Calcium: 9 mg/dL (ref 8.9–10.3)
Chloride: 105 mmol/L (ref 101–111)
Creatinine, Ser: 0.57 mg/dL (ref 0.44–1.00)
Glucose, Bld: 104 mg/dL — ABNORMAL HIGH (ref 65–99)
POTASSIUM: 4 mmol/L (ref 3.5–5.1)
SODIUM: 141 mmol/L (ref 135–145)

## 2015-11-11 LAB — CBC
HEMATOCRIT: 37.8 % (ref 36.0–46.0)
Hemoglobin: 12.7 g/dL (ref 12.0–15.0)
MCH: 31.7 pg (ref 26.0–34.0)
MCHC: 33.6 g/dL (ref 30.0–36.0)
MCV: 94.3 fL (ref 78.0–100.0)
Platelets: 150 10*3/uL (ref 150–400)
RBC: 4.01 MIL/uL (ref 3.87–5.11)
RDW: 12.5 % (ref 11.5–15.5)
WBC: 4.6 10*3/uL (ref 4.0–10.5)

## 2015-11-11 NOTE — Progress Notes (Addendum)
STROKE TEAM PROGRESS NOTE   HISTORY Madison Maxwell is an 81 y.o. female hx of HLD and memory deficits presented with difficulty using her left hand and gait instability. She reports 3 days ago she developed sudden onset of gait instability, trouble walking. Around 1.5 days ago also noted weakness in her left hand, trouble doing coordinated tasks. Symptoms did not resolve so she came to the ED. No prior CVA or TIA. Takes ASA 81mg  daily at home.   CT head imaging reviewed and no acute process noted.   Date last known well: 12/06 Time last known well: unclear tPA Given: no, outside tPA window Modified Rankin: Rankin Score=1   SUBJECTIVE (INTERVAL HISTORY) Her son is at the bedside.  Overall she feels her condition is slightly worse.  She describes more left arm weakness   OBJECTIVE Temp:  [97.4 F (36.3 C)-98.4 F (36.9 C)] 98 F (36.7 C) (12/11 0946) Pulse Rate:  [71-91] 78 (12/11 0946) Cardiac Rhythm:  [-] Normal sinus rhythm (12/11 0701) Resp:  [16-18] 16 (12/11 0946) BP: (131-152)/(49-68) 149/64 mmHg (12/11 0946) SpO2:  [96 %-100 %] 100 % (12/11 0946)  CBC:   Recent Labs Lab 11/09/15 2025 11/11/15 0457  WBC 5.3 4.6  HGB 12.0 12.7  HCT 36.8 37.8  MCV 94.8 94.3  PLT 132* 150    Basic Metabolic Panel:   Recent Labs Lab 11/09/15 1352 11/09/15 2025 11/11/15 0457  NA 141  --  141  K 4.0  --  4.0  CL 103  --  105  CO2 31  --  30  GLUCOSE 149*  --  104*  BUN 18  --  18  CREATININE 0.64 0.51 0.57  CALCIUM 9.2  --  9.0    Lipid Panel:     Component Value Date/Time   CHOL 193 11/10/2015 0448   TRIG 59 11/10/2015 0448   HDL 42 11/10/2015 0448   CHOLHDL 4.6 11/10/2015 0448   VLDL 12 11/10/2015 0448   LDLCALC 139* 11/10/2015 0448   HgbA1c: No results found for: HGBA1C Urine Drug Screen: No results found for: LABOPIA, COCAINSCRNUR, LABBENZ, AMPHETMU, THCU, LABBARB    IMAGING  Ct Head Wo Contrast 11/09/2015   Chronic changes related to age. No acute  intracranial findings. No findings suggestive of a cortical or lacunar infarction accounting for left-sided weakness.    MRI / MRA Head 11/10/2015 1. Small acute white matter infarcts in the right frontal lobe including near the left upper extremity motor representation. No associated hemorrhage or mass effect. 2. Negative for age intracranial MRA. 3. Mild for age underlying signal changes in the brain most compatible with chronic small vessel disease.  PHYSICAL EXAM General - Well nourished, well developed, in NAD   Cardiovascular - Regular rate and rhythm Pulmonary: CTA Abdomen: NT, ND, normal bowel sounds Extremities: No C/C/E  Neurological Exam Mental Status: Normal Orientation:  Oriented to person, place and time Speech:  Fluent; no dysarthria  Cranial Nerves:  PERRL; EOMI; visual fields full, face grossly symmetric, hearing grossly intact; shrug symmetric and tongue midline  Motor Exam:  Tone:  Within normal limits; Strength: 5/5 throughout right; left arm drift 4/5 strength on left today.  With more significant drift than yesterday  Sensory: Left arm and leg numb  Coordination:  Intact finger to nose on right  Gait: Deferred  ASSESSMENT/PLAN Madison Maxwell is a 81 y.o. female with history of hyperlipidemia, memory deficits, anxiety, and duodonal ulcer, presenting with gait instability and  difficulty using her left hand.. 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 Vertebral artery flow is antegrade.   2D Echo - EF 60-65%. No cardiac source of emboli identified.  LDL 139  HgbA1c pending  VTE prophylaxis - Lovenox Diet regular Room service appropriate?: Yes; Fluid consistency::  Thin  aspirin 81 mg daily prior to admission, now on No antithrombotic Will order aspirin 325 mg daily.  Patient counseled to be compliant with her antithrombotic medications  Ongoing aggressive stroke risk factor management  Therapy recommendations: Home health physical therapy recommended  Disposition:  Pending  Hypertension  Stable  Permissive hypertension (OK if < 220/120) but gradually normalize in 5-7 days  Hyperlipidemia  Home meds:  No lipid lowering medications prior to admission  LDL 139, goal < 70  Add lipitor 20 mg daily  Continue statin at discharge  Other Stroke Risk Factors  Advanced age  Obesity, There is no weight on file to calculate BMI.   Other Active Problems  Mild thrombocytopenia has improved  ATTENDING NOTE:  PLAN  Increased left-sided weakness noted on today's exam. NIH scale not changed by more than 2 points. Will not proceed with repeat CT scan at this time unless condition deteriorates; however, discharge plans will be delayed.  Strongly recommed STAT head CT if NIHSS score changes by more than 2.  Discussed plan with the primary team.  Explained to patient and sone that there may be mild cerebral edema and delayed discharge was advised.  PT/OT may need to re-evaluate patient prior to discharge given new left arm weakness.  She has more trouble with walker management on the left   Patient was seen and examined by me personally. Documentation accurately reflects findings. The laboratory and radiographic studies reviewed by me. ROS completed by me personally.  Complains of increased left arm weakness.  Same left side numbness and imbalance.   Assessment and plan completed by me personally and fully documented above. Plans as above  SIGNED BY: Dr. Sula Soda     To contact Stroke Continuity provider, please refer to [email or web address]. After hours, contact General Neurology

## 2015-11-11 NOTE — Progress Notes (Signed)
Patient is in denial of needed assistive devices for home family members are concerned that she will be a greater risk of fall once home; P T please revisit this with patient and make sure that she has recommendation for walker and 3 in 1 shower commode thanks.

## 2015-11-11 NOTE — Progress Notes (Signed)
[facility] PROGRESS NOTE  Madison Maxwell:[id number] DOB: [birth year] DOA: 11/09/2015 PCP:  Kayser, MD  Assessment/Plan: 1. Probable acute CVA. -Madison Maxwell is a pleasant 81 year old female with a history of dyslipidemia presenting with complaints of left arm weakness associate with paresthesias. -CT scan of brain did not reveal acute intracranial abnormalities. -Neurology consulted. -Continue aspirin 325 mg by mouth daily and atorvastatin 20 mg by mouth daily. -MRI of brain performed on 11/10/2015 showing small acute white matter infarcts involving the right frontal lobe. MRA was negative.  -Carotid Dopplers performed on 11/11/2015 showing 1-39% ICA plaquing -Transthoracic echocardiogram performed on 11/10/2015 impression: EF of 60-65% without wall motion abnormalities, grade 1 diastolic dysfunction -11/11/2015 she appeared to have slightly increased weakness on her left upper extremity. Case was discussed with Dr Earl Lites of neurology who recommended keeping [facility] overnight for further monitoring. Obtain CT of brain if neurological deficits worsen.   2.  Dyslipidemia. -A.m. lab work showing LDL of 439 with total cholesterol of 193 and triglyceride level of 59. -Continue statin therapy with Lipitor 20 mg by mouth daily  3.  Cognitive impairment -Currently stable, function at her baseline. Continue Exelon  Code Status: DNR Family Communication: I spoke with her husband who was present at bedside Disposition Plan: Continue CVA workup   Consultants:  Neurology   HPI/Subjective: Madison Maxwell is a pleasant 81 year old female with a past medical history of dyslipidemia, cognitive impairment, presented to the emergency department on 11/09/2015 with complaints of left hand weakness associated with numbness. She reported that symptoms have been present over the past 3 days. Initial workup included a CT scan of brain that did not show acute intracranial findings. She  was seen and evaluated by neurology and placed on the CVA protocol.  Objective: Filed Vitals:   11/11/15 0618 11/11/15 0946  BP: 152/58 149/64  Pulse: 71 78  Temp: 97.6 F (36.4 C) 98 F (36.7 C)  Resp: 16 16   No intake or output data in the 24 hours ending 11/11/15 1348 There were no vitals filed for this visit.  Exam:   General:  Patient is in no acute distress, she is sitting reading her newspaper.  Cardiovascular: Regular rate and rhythm normal S1-S2 no murmurs rubs or gallops  Respiratory: Normal respiratory effort, lungs are clear to auscultation bilaterally  Abdomen: Soft nontender nondistended  Musculoskeletal: No edema, sarcopenic  Neurological: Cranial nerves II through XII grossly intact, no slurred speech or facial droop. There is slight worsening of neurological deficits. She has 3/5 muscle strength to right upper extremity, compromised finger to nose on left. No slurred speech or facial droop.    Data Reviewed: Basic Metabolic Panel:  Recent Labs Lab 11/09/15 1352 11/09/15 2025 11/11/15 0457  NA 141  --  141  K 4.0  --  4.0  CL 103  --  105  CO2 31  --  30  GLUCOSE 149*  --  104*  BUN 18  --  18  CREATININE 0.64 0.51 0.57  CALCIUM 9.2  --  9.0   Liver Function Tests: No results for input(s): AST, ALT, ALKPHOS, BILITOT, PROT, ALBUMIN in the last 168 hours. No results for input(s): LIPASE, AMYLASE in the last 168 hours. No results for input(s): AMMONIA in the last 168 hours. CBC:  Recent Labs Lab 11/09/15 1352 11/09/15 2025 11/11/15 0457  WBC 5.1 5.3 4.6  HGB 11.7* 12.0 12.7  HCT 35.7* 36.8 37.8  MCV 94.4 94.8 94.3  PLT 132* 132*  150   Cardiac Enzymes: No results for input(s): CKTOTAL, CKMB, CKMBINDEX, TROPONINI in the last 168 hours. BNP (last 3 results) No results for input(s): BNP in the last 8760 hours.  ProBNP (last 3 results) No results for input(s): PROBNP in the last 8760 hours.  CBG:  Recent Labs Lab 11/09/15 1250   GLUCAP 116*    No results found for this or any previous visit (from the past 240 hour(s)).   Studies: Ct Head Wo Contrast  11/09/2015  CLINICAL DATA:  Patient reports trouble walking and getting up from a chair. LEFT arm weakness. This is of uncertain onset, possibly 2 days ago. EXAM: CT HEAD WITHOUT CONTRAST TECHNIQUE: Contiguous axial images were obtained from the base of the skull through the vertex without intravenous contrast. COMPARISON:  10/25/2012. FINDINGS: No evidence for acute infarction, hemorrhage, mass lesion, hydrocephalus, or extra-axial fluid. Normal for age cerebral volume. Hypoattenuation of white matter suggesting small vessel disease. No signs of large vessel occlusion. Calvarium intact. No sinus or mastoid disease. BILATERAL cataract extraction. Stable appearance from priors. IMPRESSION: Chronic changes related to age. No acute intracranial findings. No findings suggestive of a cortical or lacunar infarction accounting for left-sided weakness. Electronically Signed   By: Elsie StainJohn T Curnes M.D.   On: 11/09/2015 15:00   Mr Shirlee LatchMra Head Wo Contrast  11/10/2015  CLINICAL DATA:  81 year old female with acute onset gait instability and decreased use of the left hand for 3 days. Initial encounter. EXAM: MRI HEAD WITHOUT CONTRAST MRA HEAD WITHOUT CONTRAST TECHNIQUE: Multiplanar, multiecho pulse sequences of the brain and surrounding structures were obtained without intravenous contrast. Angiographic images of the head were obtained using MRA technique without contrast. COMPARISON:  Head CT without contrast 11/09/2015 and earlier. FINDINGS: MRI HEAD FINDINGS 10 mm triangular focus of restricted diffusion in the right corona radiata (series 4, image 28 and series 9, image 17). There is also subtle trace diffusion abnormality in the subcortical white matter of the right motor strip at the left upper extremity representation area (series 4, image 34). No other diffusion abnormality. 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 Mild associated T2 and FLAIR hyperintensity in the acute areas of involvement superimposed on Patchy and confluent bilateral white matter T2 and FLAIR signal changes, mild to moderate for age overall. No cortical encephalomalacia. Mild to moderate T2 heterogeneity also in the bilateral deep gray matter nuclei, chiefly the right lentiform nuclei and left thalamus. Brainstem and cerebellum within normal limits for age. Small chronic micro hemorrhage in the anterior left frontal lobe on series 10, image 90. Negative for age visualized cervical spine. Normal bone marrow signal. No midline shift, mass effect, evidence of mass lesion, ventriculomegaly, extra-axial collection or acute intracranial hemorrhage. Cervicomedullary junction and pituitary are within normal limits. Visible internal auditory structures appear normal. Mastoids are clear. Mild paranasal sinus mucosal thickening. Postoperative changes to both globes. Otherwise negative orbit and scalp soft tissues. MRA HEAD FINDINGS Antegrade flow in codominant distal vertebral arteries. No distal vertebral or basilar stenosis. Left PICA, SCA, and bilateral PCA origins are within normal limits. Posterior communicating arteries are diminutive or absent. Minimal to mild right PCA proximal P3 segment superior division stenosis. Otherwise normal PCA branches. Antegrade flow in both ICA siphons. No siphon stenosis. Normal carotid termini. Normal MCA and ACA origins. Anterior communicating artery and visualized ACA branches are within normal limits. MCA M1 segments and bifurcations are within normal limits. Visualized bilateral MCA branches  are within normal limits. IMPRESSION: 1. Small acute white matter infarcts in the right frontal lobe including near the left upper extremity motor representation. No associated hemorrhage or mass effect. 2.  Negative  for age intracranial MRA. 3. Mild for age underlying signal changes in the brain most compatible with chronic small vessel disease. Electronically Signed   By: Odessa Fleming M.D.   On: 11/10/2015 12:56   Mr Brain Wo Contrast  11/10/2015  CLINICAL DATA:  81 year old female with acute onset gait instability and decreased use of the left hand for 3 days. Initial encounter. EXAM: MRI HEAD WITHOUT CONTRAST MRA HEAD WITHOUT CONTRAST TECHNIQUE: Multiplanar, multiecho pulse sequences of the brain and surrounding structures were obtained without intravenous contrast. Angiographic images of the head were obtained using MRA technique without contrast. COMPARISON:  Head CT without contrast 11/09/2015 and earlier. FINDINGS: MRI HEAD FINDINGS 10 mm triangular focus of restricted diffusion in the right corona radiata (series 4, image 28 and series 9, image 17). There is also subtle trace diffusion abnormality in the subcortical white matter of the right motor strip at the left upper extremity representation area (series 4, image 34). No other diffusion abnormality. 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 Mild associated T2 and FLAIR hyperintensity in the acute areas of involvement superimposed on Patchy and confluent bilateral white matter T2 and FLAIR signal changes, mild to moderate for age overall. No cortical encephalomalacia. Mild to moderate T2 heterogeneity also in the bilateral deep gray matter nuclei, chiefly the right lentiform nuclei and left thalamus. Brainstem and cerebellum within normal limits for age. Small chronic micro hemorrhage in the anterior left frontal lobe on series 10, image 90. Negative for age visualized cervical spine. Normal bone marrow signal. No midline shift, mass effect, evidence of mass lesion, ventriculomegaly, extra-axial collection or acute intracranial hemorrhage. Cervicomedullary junction and pituitary are  within normal limits. Visible internal auditory structures appear normal. Mastoids are clear. Mild paranasal sinus mucosal thickening. Postoperative changes to both globes. Otherwise negative orbit and scalp soft tissues. MRA HEAD FINDINGS Antegrade flow in codominant distal vertebral arteries. No distal vertebral or basilar stenosis. Left PICA, SCA, and bilateral PCA origins are within normal limits. Posterior communicating arteries are diminutive or absent. Minimal to mild right PCA proximal P3 segment superior division stenosis. Otherwise normal PCA branches. Antegrade flow in both ICA siphons. No siphon stenosis. Normal carotid termini. Normal MCA and ACA origins. Anterior communicating artery and visualized ACA branches are within normal limits. MCA M1 segments and bifurcations are within normal limits. Visualized bilateral MCA branches are within normal limits. IMPRESSION: 1. Small acute white matter infarcts in the right frontal lobe including near the left upper extremity motor representation. No associated hemorrhage or mass effect. 2.  Negative for age intracranial MRA. 3. Mild for age underlying signal changes in the brain most compatible with chronic small vessel disease. Electronically Signed   By: Odessa Fleming M.D.   On: 11/10/2015 12:56    Scheduled Meds: .  stroke: mapping our early stages of recovery book   Does not apply Once  . aspirin EC  325 mg Oral Daily  . atorvastatin  20 mg Oral q1800  . enoxaparin (LOVENOX) injection  40 mg Subcutaneous Q24H  . rivastigmine  9.5 mg Transdermal Daily  . zolpidem  5 mg Oral QHS   Continuous Infusions:   Principal Problem:   CVA (cerebral infarction) Active Problems:   Memory deficit  Ataxia   Left hand weakness    Time spent: 25 min    Jeralyn Bennett  [facility] Pager [phone]. If 7PM-7AM, please contact night-coverage at [email or web address], password [facility] 11/11/2015, 1:48 PM  LOS: 1 day

## 2015-11-11 NOTE — Evaluation (Addendum)
Occupational Therapy Evaluation Patient Details Name: Madison Maxwell MRN: [id number] DOB: January 15, 1920 Today's Date: 11/11/2015    History of Present Illness Patient is 81 yo married female with 3 day h/o gait disfficulty and Lt hand weakness.  MRI on 12/10 shows Small acute white matter infarcts in the right frontal lobe   Clinical Impression   Pt demonstrates decreased coordination and strength in L UE and overall decreased safety with performing ADL tasks. She would benefit from acute OT to progress ADL independence, provide caregiver education and improve L UE function with self care tasks. Will follow on acute.    Follow Up Recommendations  Home health OT;Supervision/Assistance - 24 hour    Equipment Recommendations  3 in 1 bedside comode    Recommendations for Other Services       Precautions / Restrictions Precautions Precautions: Fall Precaution Comments: pt reports her legs feel weak and she reaches for support Restrictions Weight Bearing Restrictions: No      Mobility Bed Mobility               General bed mobility comments: on couch-not tested.   Transfers Overall transfer level: Needs assistance Equipment used: Rolling walker (2 wheeled) Transfers: Sit to/from Stand Sit to Stand: Min assist         General transfer comment: min assist to steady. min assist to descend to commode safely. cues for hand placement.    Balance     Sitting balance-Leahy Scale: Good       Standing balance-Leahy Scale: Fair                              ADL Overall ADL's : Needs assistance/impaired Eating/Feeding: Independent;Sitting   Grooming: Wash/dry hands;Minimal assistance;Standing   Upper Body Bathing: Set up;Sitting   Lower Body Bathing: Minimal assistance;Sit to/from stand   Upper Body Dressing : Minimal assistance;Sitting   Lower Body Dressing: Minimal assistance;Sit to/from stand   Toilet Transfer: Minimal  assistance;Ambulation;Regular Toilet;RW;Grab bars   Toileting- Clothing Manipulation and Hygiene: Minimal assistance;Sit to/from stand         General ADL Comments: Pt requesting to use walker when getting up to the bathroom--needed cues for safe use of walker as she tends to push it too far in front of her. Did better with increased practice. Pt tends to move quickly at times,especially with sit to stand. CUes needed to slow down and fully back up to surface prior to sitting down. Discussed with husband, pt and son recommendation for a 3in1 to go over toilet as pt is unsteady with trying to sit on low commode surface. MD in during session and OT informed MD that pt also with some proximal weakness in shoulder--MD stating pt to stay another night to monitor symptoms as pt/family had also reported some concerns with L UE earlier today per nursing. Educated pt on trying to incorporate L UE in as much of her activities as possible to improve function.      Vision     Perception     Praxis      Pertinent Vitals/Pain Pain Assessment: No/denies pain     Hand Dominance Right   Extremity/Trunk Assessment Upper Extremity Assessment Upper Extremity Assessment: LUE deficits/detail LUE Deficits / Details: shoulder and elbow strength 3+-4-/5, reports frequently dropping objects/cup. Able to locate light touch with a little extra time. decreased accuracy and speed of touching each finger to thumb LUE Sensation:  (slow) LUE  Coordination: decreased fine motor;decreased gross motor           Communication Communication Communication: No difficulties   Cognition Arousal/Alertness: Awake/alert Behavior During Therapy: Impulsive Overall Cognitive Status: Within Functional Limits for tasks assessed                     General Comments       Exercises       Shoulder Instructions      Home Living Family/patient expects to be discharged to:: Private residence Living Arrangements:  Spouse/significant other Available Help at Discharge: Family;Available 24 hours/day Type of Home: House Home Access: Stairs to enter Entergy CorporationEntrance Stairs-Number of Steps: 1 Entrance Stairs-Rails: None Home Layout: One level     Bathroom Shower/Tub: Producer, television/film/videoWalk-in shower   Bathroom Toilet: Standard     Home Equipment: Wheelchair - Careers advisermanual;Other (comment) (may be able to borrow from family or church)          Prior Functioning/Environment Level of Independence: Independent        Comments: has previously elected to not drive    OT Diagnosis: Generalized weakness   OT Problem List: Decreased strength;Decreased knowledge of use of DME or AE;Decreased coordination;Decreased safety awareness   OT Treatment/Interventions: Self-care/ADL training;Patient/family education;Therapeutic activities;DME and/or AE instruction    OT Goals(Current goals can be found in the care plan section) Acute Rehab OT Goals Patient Stated Goal: return home when able. OT Goal Formulation: With patient/family Time For Goal Achievement: 11/25/15 Potential to Achieve Goals: Good  OT Frequency: Min 3X/week   Barriers to D/C:            Co-evaluation              End of Session Equipment Utilized During Treatment: Gait belt;Rolling walker  Activity Tolerance: Patient tolerated treatment well Patient left: in chair;with call bell/phone within reach;with family/visitor present   Time: 1105-1135 OT Time Calculation (min): 30 min Charges:  OT General Charges $OT Visit: 1 Procedure OT Evaluation $Initial OT Evaluation Tier I: 1 Procedure OT Treatments $Therapeutic Activity: 8-22 mins G-Codes:    Lennox LaityStone, Emojean Gertz Stafford  [phone] 11/11/2015, 11:51 AM

## 2015-11-12 DIAGNOSIS — I639 Cerebral infarction, unspecified: Secondary | ICD-10-CM

## 2015-11-12 DIAGNOSIS — R270 Ataxia, unspecified: Secondary | ICD-10-CM

## 2015-11-12 LAB — CBC
HEMATOCRIT: 37.5 % (ref 36.0–46.0)
HEMOGLOBIN: 12.4 g/dL (ref 12.0–15.0)
MCH: 31.4 pg (ref 26.0–34.0)
MCHC: 33.1 g/dL (ref 30.0–36.0)
MCV: 94.9 fL (ref 78.0–100.0)
Platelets: 152 10*3/uL (ref 150–400)
RBC: 3.95 MIL/uL (ref 3.87–5.11)
RDW: 12.5 % (ref 11.5–15.5)
WBC: 4.7 10*3/uL (ref 4.0–10.5)

## 2015-11-12 LAB — BASIC METABOLIC PANEL
ANION GAP: 8 (ref 5–15)
BUN: 21 mg/dL — ABNORMAL HIGH (ref 6–20)
CHLORIDE: 104 mmol/L (ref 101–111)
CO2: 28 mmol/L (ref 22–32)
Calcium: 9.1 mg/dL (ref 8.9–10.3)
Creatinine, Ser: 0.47 mg/dL (ref 0.44–1.00)
GFR calc non Af Amer: >60 mL/min (ref >60)
GLUCOSE: 103 mg/dL — AB (ref 65–99)
Potassium: 4 mmol/L (ref 3.5–5.1)
Sodium: 140 mmol/L (ref 135–145)

## 2015-11-12 LAB — HEMOGLOBIN A1C
Hgb A1c MFr Bld: 5.7 % — ABNORMAL HIGH (ref 4.8–5.6)
Mean Plasma Glucose: 117 mg/dL

## 2015-11-12 MED ORDER — ASPIRIN 325 MG PO TBEC: 325.0000 mg | DELAYED_RELEASE_TABLET | Freq: Every day | ORAL | Status: DC

## 2015-11-12 MED ORDER — ATORVASTATIN CALCIUM 20 MG PO TABS: 20.0000 mg | ORAL_TABLET | Freq: Every day | ORAL | Status: DC

## 2015-11-12 NOTE — Progress Notes (Signed)
Discharge orders received, Pt for discharge home today with home health PT per [facility]. IV d/c'd. D/c instructions and RX given with verbalized understanding. Family at bedside to assist patient with discharge. Staff bought pt downstairs via wheelchair. 11/12/15 1307

## 2015-11-12 NOTE — Discharge Summary (Signed)
Physician Discharge Summary  Madison HUEBERT ZOX:[id number] DOB: [birth year] DOA: 11/09/2015  PCP:  Kayser, MD  Admit date: 11/09/2015 Discharge date: 11/12/2015  Time spent: 35 minutes  Recommendations for Outpatient Follow-up:  1. Patient admitted for CVA, was discharged on ASA 325 mg PO q daily  2. Prior to discharge she was set up with Home Health Services.    Discharge Diagnoses:  Principal Problem:   CVA (cerebral infarction) Active Problems:   Memory deficit   Ataxia   Left hand weakness   Discharge Condition: Stable  Diet recommendation: Heart Healthy  There were no vitals filed for this visit.  History of present illness:  Madison Maxwell is Maxwell 81 y.o. female with Maxwell history of hyperlipidemia, memory deficits and osteoporosis who presents with trouble using her left hand. She states that she noticed this morning while she was trying to eat some bread that she had trouble with coordination specifically with her left hand. She does not complaining of any numbness or focal weakness. She has not had any change in vision or speech. She does mention that she has been off balance but this has been steadily progressing for the past 6 months and has not happened more rapidly than that. She has no other complaints and is being admitted for Maxwell neurology workup.  Hospital Course:  Madison Maxwell is Maxwell pleasant 81 year old female with Maxwell past medical history of dyslipidemia, cognitive impairment, presented to the emergency department on 11/09/2015 with complaints of left hand weakness associated with numbness. She reported that symptoms have been present over the past 3 days. Initial workup included Maxwell CT scan of brain that did not show acute intracranial findings. She was seen and evaluated by neurology and placed on the CVA protocol.   Probable acute CVA. -Madison Maxwell is Maxwell pleasant 81 year old female with Maxwell history of dyslipidemia presenting with complaints of left arm weakness  associate with paresthesias. -CT scan of brain did not reveal acute intracranial abnormalities. -Neurology consulted. -Started  aspirin 325 mg by mouth daily and atorvastatin 20 mg by mouth daily. -MRI of brain performed on 11/10/2015 showing small acute white matter infarcts involving the right frontal lobe. MRA was negative.  -Carotid Dopplers performed on 11/11/2015 showing 1-39% ICA plaquing -Transthoracic echocardiogram performed on 11/10/2015 impression: EF of 60-65% without wall motion abnormalities, grade 1 diastolic dysfunction -She was discharged with Home Health Services  2. Dyslipidemia. -Maxwell.m. lab work showing LDL of 439 with total cholesterol of 193 and triglyceride level of 59. -Continue statin therapy with Lipitor 20 mg by mouth daily  3. Cognitive impairment -Currently stable, function at her baseline. Continue Exelon   Consultations:  Neurology  Discharge Exam: Filed Vitals:   11/12/15 0640 11/12/15 1006  BP: 124/69 124/62  Pulse: 80 79  Temp: 98.2 F (36.8 C) 97.5 F (36.4 C)  Resp: 18 16     General: Patient is in no acute distress, she is looking forward to discharger  Cardiovascular: Regular rate and rhythm normal S1-S2 no murmurs rubs or gallops  Respiratory: Normal respiratory effort, lungs are clear to auscultation bilaterally  Abdomen: Soft nontender nondistended  Musculoskeletal: No edema, sarcopenic  Neurological: Cranial nerves II through XII grossly intact, no slurred speech or facial droop. There is slight worsening of neurological deficits. She has 3/5 muscle strength to right upper extremity, compromised finger to nose on left. No slurred speech or facial droop.  Discharge Instructions   Discharge Instructions    Ambulatory referral to Neurology  Complete by:  As directed   This is Maxwell routine stroke follow up. An appointment is requested in 1 month with NP     Call MD for:  difficulty breathing, headache or visual disturbances     Complete by:  As directed      Call MD for:  extreme fatigue    Complete by:  As directed      Call MD for:  hives    Complete by:  As directed      Call MD for:  persistant dizziness or light-headedness    Complete by:  As directed      Call MD for:  persistant nausea and vomiting    Complete by:  As directed      Call MD for:  redness, tenderness, or signs of infection (pain, swelling, redness, odor or green/yellow discharge around incision site)    Complete by:  As directed      Call MD for:  severe uncontrolled pain    Complete by:  As directed      Call MD for:  temperature >100.4    Complete by:  As directed      Call MD for:    Complete by:  As directed      Diet - low sodium heart healthy    Complete by:  As directed      Increase activity slowly    Complete by:  As directed           Current Discharge Medication List    START taking these medications   Details  aspirin EC 325 MG EC tablet Take 1 tablet (325 mg total) by mouth daily. Qty: 30 tablet, Refills: 0    atorvastatin (LIPITOR) 20 MG tablet Take 1 tablet (20 mg total) by mouth daily at 6 PM. Qty: 30 tablet, Refills: 0      CONTINUE these medications which have NOT CHANGED   Details  denosumab (PROLIA) 60 MG/ML SOLN injection Inject 60 mg into the skin every 6 (six) months. Administer in upper arm, thigh, or abdomen    Multiple Vitamins-Minerals (MULTIVITAMINS THER. W/MINERALS) TABS Take 1 tablet by mouth daily.    omega-3 acid ethyl esters (LOVAZA) 1 G capsule Take 2 g by mouth 2 (two) times daily.    rivastigmine (EXELON) 9.5 mg/24hr Place 9.5 mg onto the skin daily.     vitamin B-12 (CYANOCOBALAMIN) 1000 MCG tablet Take 1,000 mcg by mouth daily.    vitamin C (ASCORBIC ACID) 500 MG tablet Take 500 mg by mouth daily.      STOP taking these medications     aspirin 81 MG chewable tablet      zolpidem (AMBIEN) 5 MG tablet        No Known Allergies Follow-up Information    Follow up with  [facility] In 1 month.   Specialty:  Neurology   Why:  Stroke Clinic, Office will call you with appointment date & time   Contact information:   [address] [place] [address] [phone]      Follow up with Madison Ran A, MD In 1 week.   Specialty:  Internal Medicine   Contact information:   [address] [place] [address] [phone]        The results of significant diagnostics from this hospitalization (including imaging, microbiology, ancillary and laboratory) are listed below for reference.    Significant Diagnostic Studies: Ct Head Wo Contrast  11/09/2015  CLINICAL  DATA:  Patient reports trouble walking and getting up from Maxwell chair. LEFT arm weakness. This is of uncertain onset, possibly 2 days ago. EXAM: CT HEAD WITHOUT CONTRAST TECHNIQUE: Contiguous axial images were obtained from the base of the skull through the vertex without intravenous contrast. COMPARISON:  10/25/2012. FINDINGS: No evidence for acute infarction, hemorrhage, mass lesion, hydrocephalus, or extra-axial fluid. Normal for age cerebral volume. Hypoattenuation of white matter suggesting small vessel disease. No signs of large vessel occlusion. Calvarium intact. No sinus or mastoid disease. BILATERAL cataract extraction. Stable appearance from priors. IMPRESSION: Chronic changes related to age. No acute intracranial findings. No findings suggestive of Maxwell cortical or lacunar infarction accounting for left-sided weakness. Electronically Signed   By: Elsie StainJohn T Curnes M.D.   On: 11/09/2015 15:00   Mr Shirlee LatchMra Head Wo Contrast  11/10/2015  CLINICAL DATA:  81 year old female with acute onset gait instability and decreased use of the left hand for 3 days. Initial encounter. EXAM: MRI HEAD WITHOUT CONTRAST MRA HEAD WITHOUT CONTRAST TECHNIQUE: Multiplanar, multiecho pulse sequences of the brain and surrounding structures were obtained without intravenous contrast. Angiographic  images of the head were obtained using MRA technique without contrast. COMPARISON:  Head CT without contrast 11/09/2015 and earlier. FINDINGS: MRI HEAD FINDINGS 10 mm triangular focus of restricted diffusion in the right corona radiata (series 4, image 28 and series 9, image 17). There is also subtle trace diffusion abnormality in the subcortical white matter of the right motor strip at the left upper extremity representation area (series 4, image 34). No other diffusion abnormality. 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 Mild associated T2 and FLAIR hyperintensity in the acute areas of involvement superimposed on Patchy and confluent bilateral white matter T2 and FLAIR signal changes, mild to moderate for age overall. No cortical encephalomalacia. Mild to moderate T2 heterogeneity also in the bilateral deep gray matter nuclei, chiefly the right lentiform nuclei and left thalamus. Brainstem and cerebellum within normal limits for age. Small chronic micro hemorrhage in the anterior left frontal lobe on series 10, image 90. Negative for age visualized cervical spine. Normal bone marrow signal. No midline shift, mass effect, evidence of mass lesion, ventriculomegaly, extra-axial collection or acute intracranial hemorrhage. Cervicomedullary junction and pituitary are within normal limits. Visible internal auditory structures appear normal. Mastoids are clear. Mild paranasal sinus mucosal thickening. Postoperative changes to both globes. Otherwise negative orbit and scalp soft tissues. MRA HEAD FINDINGS Antegrade flow in codominant distal vertebral arteries. No distal vertebral or basilar stenosis. Left PICA, SCA, and bilateral PCA origins are within normal limits. Posterior communicating arteries are diminutive or absent. Minimal to mild right PCA proximal P3 segment superior division stenosis. Otherwise normal PCA branches.  Antegrade flow in both ICA siphons. No siphon stenosis. Normal carotid termini. Normal MCA and ACA origins. Anterior communicating artery and visualized ACA branches are within normal limits. MCA M1 segments and bifurcations are within normal limits. Visualized bilateral MCA branches are within normal limits. IMPRESSION: 1. Small acute white matter infarcts in the right frontal lobe including near the left upper extremity motor representation. No associated hemorrhage or mass effect. 2.  Negative for age intracranial MRA. 3. Mild for age underlying signal changes in the brain most compatible with chronic small vessel disease. Electronically Signed   By: Odessa FlemingH  Hall M.D.   On: 11/10/2015 12:56   Mr Brain Wo Contrast  11/10/2015  CLINICAL DATA:  81 year old female with acute onset gait  instability and decreased use of the left hand for 3 days. Initial encounter. EXAM: MRI HEAD WITHOUT CONTRAST MRA HEAD WITHOUT CONTRAST TECHNIQUE: Multiplanar, multiecho pulse sequences of the brain and surrounding structures were obtained without intravenous contrast. Angiographic images of the head were obtained using MRA technique without contrast. COMPARISON:  Head CT without contrast 11/09/2015 and earlier. FINDINGS: MRI HEAD FINDINGS 10 mm triangular focus of restricted diffusion in the right corona radiata (series 4, image 28 and series 9, image 17). There is also subtle trace diffusion abnormality in the subcortical white matter of the right motor strip at the left upper extremity representation area (series 4, image 34). No other diffusion abnormality. 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 Mild associated T2 and FLAIR hyperintensity in the acute areas of involvement superimposed on Patchy and confluent bilateral white matter T2 and FLAIR signal changes, mild to moderate for age overall. No cortical encephalomalacia. Mild to moderate T2  heterogeneity also in the bilateral deep gray matter nuclei, chiefly the right lentiform nuclei and left thalamus. Brainstem and cerebellum within normal limits for age. Small chronic micro hemorrhage in the anterior left frontal lobe on series 10, image 90. Negative for age visualized cervical spine. Normal bone marrow signal. No midline shift, mass effect, evidence of mass lesion, ventriculomegaly, extra-axial collection or acute intracranial hemorrhage. Cervicomedullary junction and pituitary are within normal limits. Visible internal auditory structures appear normal. Mastoids are clear. Mild paranasal sinus mucosal thickening. Postoperative changes to both globes. Otherwise negative orbit and scalp soft tissues. MRA HEAD FINDINGS Antegrade flow in codominant distal vertebral arteries. No distal vertebral or basilar stenosis. Left PICA, SCA, and bilateral PCA origins are within normal limits. Posterior communicating arteries are diminutive or absent. Minimal to mild right PCA proximal P3 segment superior division stenosis. Otherwise normal PCA branches. Antegrade flow in both ICA siphons. No siphon stenosis. Normal carotid termini. Normal MCA and ACA origins. Anterior communicating artery and visualized ACA branches are within normal limits. MCA M1 segments and bifurcations are within normal limits. Visualized bilateral MCA branches are within normal limits. IMPRESSION: 1. Small acute white matter infarcts in the right frontal lobe including near the left upper extremity motor representation. No associated hemorrhage or mass effect. 2.  Negative for age intracranial MRA. 3. Mild for age underlying signal changes in the brain most compatible with chronic small vessel disease. Electronically Signed   By: Odessa Fleming M.D.   On: 11/10/2015 12:56    Microbiology: No results found for this or any previous visit (from the past 240 hour(s)).   Labs: Basic Metabolic Panel:  Recent Labs Lab 11/09/15 1352  11/09/15 2025 11/11/15 0457 11/12/15 0313  NA 141  --  141 140  K 4.0  --  4.0 4.0  CL 103  --  105 104  CO2 31  --  30 28  GLUCOSE 149*  --  104* 103*  BUN 18  --  18 21*  CREATININE 0.64 0.51 0.57 0.47  CALCIUM 9.2  --  9.0 9.1   Liver Function Tests: No results for input(s): AST, ALT, ALKPHOS, BILITOT, PROT, ALBUMIN in the last 168 hours. No results for input(s): LIPASE, AMYLASE in the last 168 hours. No results for input(s): AMMONIA in the last 168 hours. CBC:  Recent Labs Lab 11/09/15 1352 11/09/15 2025 11/11/15 0457 11/12/15 0313  WBC 5.1 5.3 4.6 4.7  HGB 11.7* 12.0 12.7 12.4  HCT 35.7* 36.8 37.8 37.5  MCV 94.4 94.8 94.3 94.9  PLT 132* 132* 150 152   Cardiac Enzymes: No results for input(s): CKTOTAL, CKMB, CKMBINDEX, TROPONINI in the last 168 hours. BNP: BNP (last 3 results) No results for input(s): BNP in the last 8760 hours.  ProBNP (last 3 results) No results for input(s): PROBNP in the last 8760 hours.  CBG:  Recent Labs Lab 11/09/15 1250  GLUCAP 116*       Signed:  Raniah Karan  [facility] 11/12/2015, 11:22 AM

## 2015-11-12 NOTE — Care Management Note (Signed)
Case Management Note  Patient Details  Name: Madison Maxwell MRN: [id number] Date of Birth: Dec 04, 1919  Subjective/Objective:   Patient admitted with CVA. Patient is from home with her husband.                  Action/Plan: Plan is to discharge home today with home health services. CM met with the patient and her family and provided them a list of home health agencies in the [facility] area. They selected [facility]. Tiffany with [facility] notified and accepted the referral. Patient also to discharge with a 3 in 1 and wheelchair. Jermaine with [facility] DME notified and will deliver the equipment to the room. Will update the bedside RN.    Expected Discharge Date:   (unknown)               Expected Discharge Plan:  Home/Self Care  In-House Referral:     Discharge planning Services  CM Consult  Post Acute Care Choice:  Durable Medical Equipment, Home Health Choice offered to:  Patient, Spouse, Adult Children  DME Arranged:  3-N-1, [place] rolling DME Agency:  [place] Arranged:  RN, PT, OT [facility] Agency:  [place]  Status of Service:  Completed, signed off  Medicare Important Message Given:    Date Medicare IM Given:    Medicare IM give by:    Date Additional Medicare IM Given:    Additional Medicare Important Message give by:     If discussed at [place] of Stay Meetings, dates discussed:    Additional Comments:  Pollie Friar, RN 11/12/2015, 11:48 AM

## 2015-11-12 NOTE — Progress Notes (Signed)
Occupational Therapy Treatment Patient Details Name: Madison Maxwell MRN: [id number] DOB: 05/06/1920 Today's Date: 11/12/2015    History of present illness Patient is 81 yo married female with 3 day h/o gait disfficulty and Lt hand weakness.  MRI on 12/10 shows Small acute white matter infarcts in the right frontal lobe   OT comments  Focus of session on neuromuscular rehab of L UE and educating pt and family in HEP and safety related to pt's impulsivity and impaired safety awareness.  Family verbalizing understanding of all. Eager to go home.  Follow Up Recommendations  Home health OT;Supervision/Assistance - 24 hour    Equipment Recommendations  3 in 1 bedside comode    Recommendations for Other Services      Precautions / Restrictions Precautions Precautions: Fall       Mobility Bed Mobility                  Transfers                      Balance                                   ADL                                         General ADL Comments: Educated pt and family in safety with regard to use of L UE to avoid injury with hot items such as coffee or carrying pots of hot content, cutting vegetables.      Vision                     Perception     Praxis      Cognition   Behavior During Therapy: Impulsive Overall Cognitive Status: Impaired/Different from baseline Area of Impairment: Safety/judgement          Safety/Judgement: Decreased awareness of deficits;Decreased awareness of safety          Extremity/Trunk Assessment               Exercises General Exercises - Upper Extremity Shoulder Flexion: Strengthening;Left;10 reps;Seated;Bar weights/barbell Bar Weights/Barbell (Shoulder Flexion): 1 lb Shoulder Extension: Strengthening;Left;10 reps;Seated;Bar weights/barbell Bar Weights/Barbell (Shoulder Extension): 1 lb Elbow Flexion: Strengthening;Left;10 reps;Seated;Bar  weights/barbell Bar Weights/Barbell (Elbow Flexion): 1 lb Elbow Extension: Strengthening;Left;10 reps;Seated;Bar weights/barbell Bar Weights/Barbell (Elbow Extension): 1 lb Wrist Flexion: Strengthening;Left;10 reps;Seated;Bar weights/barbell Bar Weights/Barbell (Wrist Flexion): 1 lb Wrist Extension: Strengthening;Left;10 reps;Seated;Bar weights/barbell Bar Weights/Barbell (Wrist Extension): 1 lb Digit Composite Flexion: Strengthening;Left;10 reps;Seated;Squeeze ball Composite Extension: Strengthening;Left;5 reps;Seated (theraputty) Other Exercises Other Exercises: Yellow theraputty exercises--gross grasp, rolling, pinch, spreading fingers, poking Other Exercises: incorporated L UE in opening and closing containers, reading paper, holding clipboard while doing crossword puzzle   Shoulder Instructions       General Comments      Pertinent Vitals/ Pain       Pain Assessment: No/denies pain  Home Living                                          Prior Functioning/Environment              Frequency Min 3X/week     Progress  Toward Goals  OT Goals(current goals can now be found in the care plan section)  Progress towards OT goals: Progressing toward goals  Acute Rehab OT Goals Patient Stated Goal: return home when able.  Plan Discharge plan remains appropriate    Co-evaluation                 End of Session     Activity Tolerance Patient tolerated treatment well   Patient Left in chair;with call bell/phone within reach;with family/visitor present   Nurse Communication          Time: 0981-1914 OT Time Calculation (min): 37 min  Charges: OT General Charges $OT Visit: 1 Procedure OT Treatments $Neuromuscular Re-education: 23-37 mins  Evern Bio 11/12/2015, 9:39 AM  [phone]

## 2015-11-12 NOTE — Progress Notes (Signed)
STROKE TEAM PROGRESS NOTE   HISTORY Madison Maxwell is an 81 y.o. female hx of HLD and memory deficits presented with difficulty using her left hand and gait instability. She reports 3 days ago she developed sudden onset of gait instability, trouble walking. Around 1.5 days ago also noted weakness in her left hand, trouble doing coordinated tasks. Symptoms did not resolve so she came to the ED. No prior CVA or TIA. Takes ASA  daily at home. CT head imaging reviewed and no acute process noted. She was last known well 12/06 time unclear. tPA not given as outside tPA window. Modified Rankin: Rankin Score=1   SUBJECTIVE (INTERVAL HISTORY) Her husband and daughter are at the bedside. Pt states she is feeling better and wants to go home. Patient excited HH recommended.   OBJECTIVE Temp:  [97.5 F (36.4 C)-98.3 F (36.8 C)] 97.5 F (36.4 C) (12/12 1006) Pulse Rate:  [70-87] 79 (12/12 1006) Cardiac Rhythm:  [-] Normal sinus rhythm (12/12 0700) Resp:  [16-20] 16 (12/12 1006) BP: (113-145)/(57-75) 124/62 mmHg (12/12 1006) SpO2:  [94 %-99 %] 94 % (12/12 1006)  CBC:   Recent Labs Lab 11/11/15 0457 11/12/15 0313  WBC 4.6 4.7  HGB 12.7 12.4  HCT 37.8 37.5  MCV 94.3 94.9  PLT 150 152    Basic Metabolic Panel:   Recent Labs Lab 11/11/15 0457 11/12/15 0313  NA 141 140  K 4.0 4.0  CL 105 104  CO2 30 28  GLUCOSE 104* 103*  BUN 18 21*  CREATININE 0.57 0.47  CALCIUM 9.0 9.1    Lipid Panel:     Component Value Date/Time   CHOL 193 11/10/2015 0448   TRIG 59 11/10/2015 0448   HDL 42 11/10/2015 0448   CHOLHDL 4.6 11/10/2015 0448   VLDL 12 11/10/2015 0448   LDLCALC 139* 11/10/2015 0448   HgbA1c: No results found for: HGBA1C Urine Drug Screen: No results found for: LABOPIA, COCAINSCRNUR, LABBENZ, AMPHETMU, THCU, LABBARB    IMAGING  Ct Head Wo Contrast 11/09/2015   Chronic changes related to age. No acute intracranial findings. No findings suggestive of a cortical or  lacunar infarction accounting for left-sided weakness.   MRI / MRA Head 11/10/2015 1. Small acute white matter infarcts in the right frontal lobe including near the left upper extremity motor representation. No associated hemorrhage or mass effect. 2. Negative for age intracranial MRA. 3. Mild for age underlying signal changes in the brain most compatible with chronic small vessel disease.   PHYSICAL EXAM General - Well nourished, well developed, in NAD   Cardiovascular - Regular rate and rhythm Pulmonary: CTA Abdomen: NT, ND, normal bowel sounds Extremities: No C/C/E Neurological Exam Mental Status: Normal Orientation:  Oriented to person, place and time Speech:  Fluent; no dysarthria Cranial Nerves:  PERRL; EOMI; visual fields full, face grossly symmetric, hearing grossly intact; shrug symmetric and tongue midline Motor Exam:  Tone:  Within normal limits; Strength: 5/5 throughout right; left arm drift 4/5 strength on left today.  With more significant drift than yesterday Sensory: Left arm and leg numb Coordination:  Intact finger to nose on right Gait: Deferred   ASSESSMENT/PLAN Ms. Nadyne C Morro is a 81 y.o. female with history of hyperlipidemia, memory deficits, anxiety, and duodonal ulcer, presenting with gait instability and difficulty using her left hand. She did not receive IV t-PA due to late presentation  Stroke:  Non-dominant R frontal lobe white matter infarcts secondary to small vessel disease   Resultant  Left sided weakness and numbness  MRI  Small acute white matter infarcts in the right frontal lobe  MRA  within normal limits for age  Carotid Doppler 1-39% ICA plaquing. Vertebral artery flow is antegrade.   2D Echo - EF 60-65%. No cardiac source of emboli identified.  LDL 139  HgbA1c pending   VTE prophylaxis - Lovenox Diet regular Room service appropriate?: Yes; Fluid consistency:: Thin  aspirin 81 mg daily prior to admission, increased to    aspirin 325 mg daily. Continue at discharge.   Patient counseled to be compliant with her antithrombotic medications  Drink 6-8 glasses of liquids/day  Ongoing aggressive stroke risk factor management  Therapy recommendations: Home health physical therapy & OT, RW w/ 5" wheels, 3N1  Increased left-sided weakness delayed discharge yesterday  Anticipate good recovery  Disposition:  Home with HH NOTHING FURTHER TO ADD FROM THE STROKE STANDPOINT Patient has a 10-15% risk of having another stroke over the next year, the highest risk is within 2 weeks of the most recent stroke/TIA (risk of having a stroke following a stroke or TIA is the same). Ongoing risk factor control by Primary Care Physician Stroke Service will sign off. Please call should any needs arise. [facility], order placed.  Hypertension  Stable  Hyperlipidemia  Home meds:  No lipid lowering medications prior to admission  LDL 139, goal < 70  Add lipitor 20 mg daily  Continue statin at discharge  Other Stroke Risk Factors  Advanced age  Other Active Problems  Baseline cognitive impairment  Mild thrombocytopenia has improved  Hospital D#2   Rhoderick MoodyBIBY,SHARON  [facility] See Amion for Pager information 11/12/2015 10:38 AM  I have personally examined this patient, reviewed notes, independently viewed imaging studies, participated in medical decision making and plan of care. I have made any additions or clarifications directly to the above note. Agree with note above.   Delia HeadyPramod Adonai Selsor, MD Medical Director [facility] Pager: [phone] 11/12/2015 5:48 PM  To contact Stroke Continuity provider, please refer to [email or web address]. After hours, contact General Neurology

## 2015-12-20 ENCOUNTER — Encounter: Payer: Self-pay | Admitting: Nurse Practitioner

## 2015-12-20 ENCOUNTER — Ambulatory Visit (INDEPENDENT_AMBULATORY_CARE_PROVIDER_SITE_OTHER): Payer: Medicare Other | Admitting: Nurse Practitioner

## 2015-12-20 VITALS — BP 145/67 | HR 85 | Ht 65.0 in | Wt 101.6 lb

## 2015-12-20 DIAGNOSIS — R270 Ataxia, unspecified: Secondary | ICD-10-CM | POA: Diagnosis not present

## 2015-12-20 DIAGNOSIS — I639 Cerebral infarction, unspecified: Secondary | ICD-10-CM | POA: Diagnosis not present

## 2015-12-20 NOTE — Progress Notes (Addendum)
[facility]  PATIENT: Madison Maxwell DOB: December 27, 1919   REASON FOR VISIT: Hospital follow-up for stroke HISTORY FROM: Patient, husband and daughter    HISTORY OF PRESENT ILLNESS: Ms. Madison Maxwell, 82 year old female was admitted 11/09/2015 after having memory deficits and difficulty using her left hand and gait instability. This has been going on for approximately 3 days and then she developed sudden onset of gait instability and trouble walking. She had weakness in her left hand and trouble doing coordinated tasks. No history of prior CVA or TIA. Was taking aspirin 0.81 at home. TPA not given due to outside TPA window. LDL noted to be elevated at 139 she was started on Lipitor. MRI of the head small acute white matter infarcts in the right frontal lobe. Chronic small vessel disease No associated hemorrhage or mass effect. Intracranial MRA negative. Carotid Doppler 1-39% plaquing. 2-D echo ejection fraction 60-65% no cardiac source of emboli identified. She was discharged home with home health physical therapy on 11/12/2015. Her therapy has now completed after about 6 weeks. She continues to do home exercise program. She has not had further stroke or TIA symptoms She returns for reevaluation   REVIEW OF SYSTEMS: Full 14 system review of systems performed and notable only for those listed, all others are neg:  Constitutional: neg  Cardiovascular: neg Ear/Nose/Throat: Hearing loss Skin: neg Eyes: neg Respiratory: neg Gastroitestinal: Urinary incontinence at times Hematology/Lymphatic: neg  Endocrine: neg Musculoskeletal:neg Allergy/Immunology: neg Neurological: Memory loss Psychiatric: neg Sleep : Insomnia   ALLERGIES: No Known Allergies  HOME MEDICATIONS: Outpatient Prescriptions Prior to Visit  Medication Sig Dispense Refill  . aspirin EC 325 MG EC tablet Take 1 tablet (325 mg total) by mouth daily. 30 tablet 0  . atorvastatin (LIPITOR) 20 MG tablet Take 1  tablet (20 mg total) by mouth daily at 6 PM. 30 tablet 0  . denosumab (PROLIA) 60 MG/ML SOLN injection Inject 60 mg into the skin every 6 (six) months. Administer in upper arm, thigh, or abdomen    . Multiple Vitamins-Minerals (MULTIVITAMINS THER. W/MINERALS) TABS Take 1 tablet by mouth daily.    Marland Kitchen omega-3 acid ethyl esters (LOVAZA) 1 G capsule Take 2 g by mouth 2 (two) times daily.    . rivastigmine (EXELON) 9.5 mg/24hr Place 9.5 mg onto the skin daily.     . vitamin B-12 (CYANOCOBALAMIN) 1000 MCG tablet Take 1,000 mcg by mouth daily.    . vitamin C (ASCORBIC ACID) 500 MG tablet Take 500 mg by mouth daily.     No facility-administered medications prior to visit.    PAST MEDICAL HISTORY: Past Medical History  Diagnosis Date  . Hyperlipidemia   . Memory deficit   . Allergic rhinitis   . Duodenal ulcer   . External hemorrhoid   . Anxiety   . SUI (stress urinary incontinence), female   . Osteoarthritis   . Osteoporosis   . Vitamin D deficiency     PAST SURGICAL HISTORY: Past Surgical History  Procedure Laterality Date  . Vaginal hysterectomy    . Appendectomy      FAMILY HISTORY: Family History  Problem Relation Age of Onset  . Colon cancer Neg Hx   . Colon polyps Sister     SOCIAL HISTORY: Social History   Social History  . Marital Status: Married    Spouse Name: N/A  . Number of Children: 2  . Years of Education: N/A   Occupational History  . [employer]  Retired    Social History Main Topics  . Smoking status: Never Smoker   . Smokeless tobacco: Never Used  . Alcohol Use: No  . Drug Use: No  . Sexual Activity: Not on file   Other Topics Concern  . Not on file   Social History Narrative   Daily caffeine      PHYSICAL EXAM  Filed Vitals:   12/20/15 1356  BP: 145/67  Pulse: 85  Height:  (1.651 m)  Weight: 101 lb 9.6 oz (46.085 kg)   Body mass index is 16.91 kg/(m^2).  Generalized: Well developed, frail appearing female in no  acute distress  Head: normocephalic and atraumatic,. Oropharynx benign  Neck: Supple, no carotid bruits  Cardiac: Regular rate rhythm, no murmur  Musculoskeletal: No deformity   Neurological examination   Mentation: Alert oriented to time, place, history taking. Attention span and concentration appropriate. Recent and remote memory intact.  Follows all commands speech and language fluent.   Cranial nerve II-XII: Pupils were equal round reactive to light extraocular movements were full, visual field were full on confrontational test. Facial sensation and strength were normal. hearing was intact to finger rubbing bilaterally. Uvula tongue midline. head turning and shoulder shrug were normal and symmetric.Tongue protrusion into cheek strength was normal. Motor: normal bulk and tone, full strength in the BUE, BLE, fine finger movements normal, no pronator drift. No focal weakness. Good grip strength Sensory: normal and symmetric to light touch, pinprick, and  Vibration,  Coordination: finger-nose-finger, heel-to-shin bilaterally, no dysmetria Reflexes: Depressed upper and lower plantar responses were flexor bilaterally. Gait and Station: Rising up from seated position without assistance, wide based stance,  mildly unsteady gait with cane  DIAGNOSTIC DATA (LABS, IMAGING, TESTING) - I reviewed patient records, labs, notes, testing and imaging myself where available.  Lab Results  Component Value Date   WBC 4.7 11/12/2015   HGB 12.4 11/12/2015   HCT 37.5 11/12/2015   MCV 94.9 11/12/2015   PLT 152 11/12/2015      Component Value Date/Time   NA 140 11/12/2015 0313   K 4.0 11/12/2015 0313   CL 104 11/12/2015 0313   CO2 28 11/12/2015 0313   GLUCOSE 103* 11/12/2015 0313   BUN 21* 11/12/2015 0313   CREATININE 0.47 11/12/2015 0313   CALCIUM 9.1 11/12/2015 0313   GFRNONAA >60 11/12/2015 0313   GFRAA >60 11/12/2015 0313   Lab Results  Component Value Date   CHOL 193 11/10/2015   HDL 42  11/10/2015   LDLCALC 139* 11/10/2015   TRIG 59 11/10/2015   CHOLHDL 4.6 11/10/2015   Lab Results  Component Value Date   HGBA1C 5.7* 11/10/2015    ASSESSMENT AND PLAN  82 y.o. year old female  has a past medical history of Hyperlipidemia; Memory deficit;  Anxiety; SUI (stress urinary incontinence),  Osteoarthritis; Osteoporosis; and Vitamin D deficiency.and right frontal lobe infarct secondary to small vessel disease. She has not had further stroke or TIA symptoms since discharge.The patient is a current patient of Dr. Pearlean Brownie who is out of the office today . This note is sent to the work in doctor.     PLAN: Management of risk factors is essential to prevent stroke Continue aspirin 325 for secondary stroke prevention Continue Lipitor for cholesterol and lipids to be followed by Dr. Darcus Austin LDL less than 100 recent 139 Hemoglobin A1c 5.7 Continue home exercise program Use cane for safe ambulation and to prevent falls Additional 10 minutes spent answering questions the  family members Follow up with [place] in 6 months Vst time 30 min Nilda Riggs, [facility], [facility], APRN  [facility] [address], [address] [place], [place] [address] [phone]  Personally examined images, and have participated in and made any corrections needed to history, physical, neuro exam,assessment and plan as stated above.  I have personally reviewed the history, evaluated lab date, reviewed imaging studies and agree with radiology interpretations.    Naomie Dean, MD [facility]

## 2015-12-20 NOTE — Patient Instructions (Signed)
Continue aspirin 325 for secondary stroke prevention Continue Lipitor for cholesterol and lipids to be followed by Dr. Darcus Austin Continue home exercise program Use cane for safe ambulation and to prevent falls Follow up with [place] in 6 months

## 2015-12-25 ENCOUNTER — Ambulatory Visit: Payer: Medicare Other | Attending: Internal Medicine

## 2015-12-25 DIAGNOSIS — R278 Other lack of coordination: Secondary | ICD-10-CM | POA: Insufficient documentation

## 2015-12-25 DIAGNOSIS — R2681 Unsteadiness on feet: Secondary | ICD-10-CM | POA: Diagnosis present

## 2015-12-25 DIAGNOSIS — R29898 Other symptoms and signs involving the musculoskeletal system: Secondary | ICD-10-CM | POA: Diagnosis present

## 2015-12-25 DIAGNOSIS — R269 Unspecified abnormalities of gait and mobility: Secondary | ICD-10-CM | POA: Diagnosis present

## 2015-12-25 NOTE — Therapy (Signed)
[facility] [address] [place], [place], [address] Phone: [phone]   Fax:  [phone]  Physical Therapy Evaluation  Patient Details  Name: Madison Maxwell MRN: [id number] Date of Birth: [birth year] Referring Provider: Dr. Waynard Edwards  Encounter Date: 12/25/2015      PT End of Session - 12/25/15 1717    Visit Number 1   Number of Visits 9   Date for PT Re-Evaluation 01/24/16   Authorization Type G-code every 10th visit.   PT Start Time 1015   PT Stop Time 1058   PT Time Calculation (min) 43 min   Equipment Utilized During Treatment Gait belt   Activity Tolerance Patient tolerated treatment well   Behavior During Therapy WFL for tasks assessed/performed      Past Medical History  Diagnosis Date  . Hyperlipidemia   . Memory deficit   . Allergic rhinitis   . Duodenal ulcer   . External hemorrhoid   . Anxiety   . SUI (stress urinary incontinence), female   . Osteoarthritis   . Osteoporosis   . Vitamin D deficiency     Past Surgical History  Procedure Laterality Date  . Vaginal hysterectomy    . Appendectomy      There were no vitals filed for this visit.  Visit Diagnosis:  Abnormality of gait - Plan: PT plan of care cert/re-cert  Weakness of both lower extremities - Plan: PT plan of care cert/re-cert  Unsteadiness - Plan: PT plan of care cert/re-cert  Coordination impairment - Plan: PT plan of care cert/re-cert      Subjective Assessment - 12/25/15 1024    Subjective Pt had assistance from husband in providing hx, 2/2 memory loss. Pt went to the ED on 11/28/16 as she was experiencing weakness on L side, she was dx with CVA at that time. Pt's dtr reports she has noticed that pt now drags L LE a little during amb since onset of CVA. Pt is now IND with bathing, cooking and dressing after HHOT. Pt has experinced one fall during amb. outdoors. Pt occasionally using SPC and RW, but mostly holds onto husband  during amb.    Patient is accompained by: Family member  Madison Maxwell-husband and dtr: Madison Maxwell   Pertinent History Osteoporosis, SUI, memory loss   Patient Stated Goals Get back to "normal", so I can get back to my yard work.    Currently in Pain? No/denies            [facility] PT Assessment - 12/25/15 1028    Assessment   Medical Diagnosis Completed stoke   Referring Provider Dr. Waynard Edwards   Onset Date/Surgical Date 11/29/15   Hand Dominance Right   Prior Therapy HHPT and HHOT   Precautions   Precautions Fall   Precaution Comments based on TUG time, gait speed, BERG score   Restrictions   Weight Bearing Restrictions No   Balance Screen   Has the patient fallen in the past 6 months Yes   How many times? 1   Has the patient had a decrease in activity level because of a fear of falling?  No   Is the patient reluctant to leave their home because of a fear of falling?  Yes   Home Environment   Living Environment Private residence   Living Arrangements Spouse/significant other   Available Help at Discharge Family   Type of Home House   Home Access Stairs to enter   Entrance Stairs-Number of Steps 1  Entrance Stairs-Rails None   Home Layout One level   Home Equipment Walker - 2 wheels;Cane - single point;Shower seat - built in;Grab bars - tub/shower;Toilet riser   Prior Function   Level of Independence Independent;Independent with gait;Independent with transfers   Vocation Retired   Leisure Yardwork   Cognition   Overall Cognitive Status Impaired/Different from baseline   Area of Impairment Memory   Memory Decreased short-term memory   Observation/Other Assessments   Focus on Therapeutic Outcomes (FOTO)  Started FOTO but did not complete.   Sensation   Light Touch Impaired by gross assessment   Additional Comments Pt reported L hand numbness and decreased light touch in L hand. "It also feels weird when I pick things up."   Coordination   Gross Motor Movements are Fluid and  Coordinated Yes   Fine Motor Movements are Fluid and Coordinated No   Finger Nose Finger Test Increased time and decr. accuracy with L hand.   Heel Shin Test B LE WNL   Posture/Postural Control   Posture/Postural Control Postural limitations   Postural Limitations Forward head;Rounded Shoulders   Posture Comments Pt appeared to experience L scapular winging during gait.   Tone   Assessment Location Right Lower Extremity;Left Lower Extremity   ROM / Strength   AROM / PROM / Strength AROM;Strength   AROM   Overall AROM  Within functional limits for tasks performed   Overall AROM Comments B UE/LE WFL.   Strength   Overall Strength Deficits   Overall Strength Comments B LE strength globally: 4/5 except for 3+/5 B hamstrings.    Transfers   Transfers Sit to Stand;Stand to Sit   Sit to Stand 5: Supervision;Without upper extremity assist;From chair/3-in-1   Stand to Sit 5: Supervision;Without upper extremity assist;To chair/3-in-1   Ambulation/Gait   Ambulation/Gait Yes   Ambulation/Gait Assistance 4: Min guard;4: Min assist   Ambulation/Gait Assistance Details Pt required min A during amb. back to exam room, otherwise min guard with intermittent HHA.   Ambulation Distance (Feet) 100 Feet   Assistive device None;1 person hand held assist   Gait Pattern Step-through pattern;Decreased stride length;Decreased weight shift to left;Narrow base of support;Decreased trunk rotation   Ambulation Surface Level;Indoor   Gait velocity 1.70ft/sec.   Standardized Balance Assessment   Standardized Balance Assessment Berg Balance Test;Timed Up and Go Test   Berg Balance Test   Sit to Stand Able to stand without using hands and stabilize independently   Standing Unsupported Able to stand safely 2 minutes   Sitting with Back Unsupported but Feet Supported on Floor or Stool Able to sit safely and securely 2 minutes   Stand to Sit Sits safely with minimal use of hands   Transfers Able to transfer safely,  minor use of hands   Standing Unsupported with Eyes Closed Able to stand 10 seconds with supervision   Standing Ubsupported with Feet Together Able to place feet together independently and stand for 1 minute with supervision   From Standing, Reach Forward with Outstretched Arm Can reach forward >12 cm safely (5")  7"   From Standing Position, Pick up Object from Floor Able to pick up shoe, needs supervision   From Standing Position, Turn to Look Behind Over each Shoulder Looks behind one side only/other side shows less weight shift   Turn 360 Degrees Needs close supervision or verbal cueing   Standing Unsupported, Alternately Place Feet on Step/Stool Able to complete 4 steps without aid or supervision  Standing Unsupported, One Foot in Front Able to take small step independently and hold 30 seconds   Standing on One Leg Tries to lift leg/unable to hold 3 seconds but remains standing independently  2 seconds   Total Score 41   Timed Up and Go Test   TUG Normal TUG   Normal TUG (seconds) 20.6  without AD   RLE Tone   RLE Tone Within Functional Limits   LLE Tone   LLE Tone Within Functional Limits                           PT Education - 12/25/15 1716    Education provided Yes   Education Details PT educated pt on the importance of using RW at all times based on BERG score and falls risk. PT educated pt on frequency/duration and outcome measures.   Person(s) Educated Patient;Spouse;Child(ren)   Methods Explanation   Comprehension Verbalized understanding          PT Short Term Goals - 12/25/15 1721    PT SHORT TERM GOAL #1   Title same as LTGs           PT Long Term Goals - 12/25/15 1721    PT LONG TERM GOAL #1   Title Pt will be IND in performing HEP with assistance from spouse to improve deficits listed above. Target date: 01/22/16   Status New   PT LONG TERM GOAL #2   Title Pt will improve BERG score to >/=46/56 to decr. falls risk. Target date:  01/22/16   Status New   PT LONG TERM GOAL #3   Title Pt will improve TUG with LRAD to </=13.5sec. to decr. falls risk. Target date: 01/22/16   Status New   PT LONG TERM GOAL #4   Title Pt will improve gait speed to >/=2.32ft/sec. with LRAD to safely amb. in the community. Target date: 01/22/16   Status New   PT LONG TERM GOAL #5   Title Pt will verbalize understanding of CVA risk factors/symptoms to decr. risk of another CVA. Target date: 01/22/16   Status New   Additional Long Term Goals   Additional Long Term Goals Yes   PT LONG TERM GOAL #6   Title Pt will amb. 600' over even/uneven terrain at MOD I level with LRAD to improve functional mobility and allow pt to work out in yard. Target date: 01/22/16   Status New               Plan - 12/25/15 1718    Clinical Impression Statement Pt is a pleasant 82y/o female presenting to OPPT neuro s/p CVA on 11/28/16. Pt presented with impaired balance, gait deviations, decreased strength, decreased safety awareness, impaired postural, impaired coordination, and falls risk based on TUG time, BERG and gait speed.    Pt will benefit from skilled therapeutic intervention in order to improve on the following deficits Abnormal gait;Impaired sensation;Postural dysfunction;Impaired flexibility;Decreased safety awareness;Decreased coordination;Decreased balance;Decreased mobility;Decreased strength;Decreased knowledge of use of DME   Rehab Potential Good   Clinical Impairments Affecting Rehab Potential Osteoporosis, memory deficits, and decrease want to use AD   PT Frequency 2x / week   PT Duration 4 weeks   PT Treatment/Interventions ADLs/Self Care Home Management;Biofeedback;Balance training;Manual techniques;Therapeutic exercise;Therapeutic activities;Orthotic Fit/Training;Functional mobility training;Stair training;Gait training;DME Instruction;Patient/family education;Neuromuscular re-education   PT Next Visit Plan Provide pt with OTAGO  and CVA risk  factors handout,  begin gait training with RW  Consulted and Agree with Plan of Care Family member/caregiver;Patient;Other (Comment)   Family Member Consulted pt's dtr: Madison Maxwell and husband: Madison Maxwell (sp?)          G-Codes - 2016/01/02 1725    Functional Assessment Tool Used BERG: 41/56; gait speed no AD: 1.64ft/sec; TUG no AD: 20.6sec.   Functional Limitation Mobility: Walking and moving around   Mobility: Walking and Moving Around Current Status [phone]) At least 40 percent but less than 60 percent impaired, limited or restricted   Mobility: Walking and Moving Around Goal Status [phone]) At least 1 percent but less than 20 percent impaired, limited or restricted       Problem List Patient Active Problem List   Diagnosis Date Noted  . Left hand weakness   . Ataxia   . CVA (cerebral infarction) 11/09/2015  . Memory deficit     Kaiden Pech L Jan 02, 2016, 5:27 PM  [facility] [address] [place], [place], [address] Phone: [phone]   Fax:  [phone]  Name: AUTUMNROSE YORE MRN: [id number] Date of Birth: [birth year]   Zerita Boers, PT,DPT 01-02-16 5:27 PM Phone: [phone] Fax: [phone]

## 2016-01-01 ENCOUNTER — Ambulatory Visit: Payer: Medicare Other

## 2016-01-01 DIAGNOSIS — R278 Other lack of coordination: Secondary | ICD-10-CM

## 2016-01-01 DIAGNOSIS — R269 Unspecified abnormalities of gait and mobility: Secondary | ICD-10-CM | POA: Diagnosis not present

## 2016-01-01 DIAGNOSIS — R2681 Unsteadiness on feet: Secondary | ICD-10-CM

## 2016-01-01 DIAGNOSIS — R29898 Other symptoms and signs involving the musculoskeletal system: Secondary | ICD-10-CM

## 2016-01-01 NOTE — Patient Instructions (Signed)
Stroke Prevention Some medical conditions and behaviors are associated with an increased chance of having a stroke. You may prevent a stroke by making healthy choices and managing medical conditions. HOW CAN I REDUCE MY RISK OF HAVING A STROKE?   Stay physically active. Get at least 30 minutes of activity on most or all days.  Do not smoke. It may also be helpful to avoid exposure to secondhand smoke.  Limit alcohol use. Moderate alcohol use is considered to be:  No more than 2 drinks per day for men.  No more than 1 drink per day for nonpregnant women.  Eat healthy foods. This involves:  Eating 5 or more servings of fruits and vegetables a day.  Making dietary changes that address high blood pressure (hypertension), high cholesterol, diabetes, or obesity.  Manage your cholesterol levels.  Making food choices that are high in fiber and low in saturated fat, trans fat, and cholesterol may control cholesterol levels.  Take any prescribed medicines to control cholesterol as directed by your health care provider.  Manage your diabetes.  Controlling your carbohydrate and sugar intake is recommended to manage diabetes.  Take any prescribed medicines to control diabetes as directed by your health care provider.  Control your hypertension.  Making food choices that are low in salt (sodium), saturated fat, trans fat, and cholesterol is recommended to manage hypertension.  Ask your health care provider if you need treatment to lower your blood pressure. Take any prescribed medicines to control hypertension as directed by your health care provider.  If you are 18-39 years of age, have your blood pressure checked every 3-5 years. If you are 40 years of age or older, have your blood pressure checked every year.  Maintain a healthy weight.  Reducing calorie intake and making food choices that are low in sodium, saturated fat, trans fat, and cholesterol are recommended to manage  weight.  Stop drug abuse.  Avoid taking birth control pills.  Talk to your health care provider about the risks of taking birth control pills if you are over 35 years old, smoke, get migraines, or have ever had a blood clot.  Get evaluated for sleep disorders (sleep apnea).  Talk to your health care provider about getting a sleep evaluation if you snore a lot or have excessive sleepiness.  Take medicines only as directed by your health care provider.  For some people, aspirin or blood thinners (anticoagulants) are helpful in reducing the risk of forming abnormal blood clots that can lead to stroke. If you have the irregular heart rhythm of atrial fibrillation, you should be on a blood thinner unless there is a good reason you cannot take them.  Understand all your medicine instructions.  Make sure that other conditions (such as anemia or atherosclerosis) are addressed. SEEK IMMEDIATE MEDICAL CARE IF:   You have sudden weakness or numbness of the face, arm, or leg, especially on one side of the body.  Your face or eyelid droops to one side.  You have sudden confusion.  You have trouble speaking (aphasia) or understanding.  You have sudden trouble seeing in one or both eyes.  You have sudden trouble walking.  You have dizziness.  You have a loss of balance or coordination.  You have a sudden, severe headache with no known cause.  You have new chest pain or an irregular heartbeat. Any of these symptoms may represent a serious problem that is an emergency. Do not wait to see if the symptoms will   go away. Get medical help at once. Call your local emergency services (911 in [place] Do not drive yourself to the hospital.   This information is not intended to replace advice given to you by your health care provider. Make sure you discuss any questions you have with your health care provider.   Document Released: 12/25/2004 Document Revised: 12/08/2014 Document Reviewed:  05/20/2013 Elsevier Interactive Patient Education 2016 [employer]vier Inc.  

## 2016-01-01 NOTE — Therapy (Signed)
[facility] [address] [place], [place], [address] Phone: [phone]   Fax:  [phone]  Physical Therapy Treatment  Patient Details  Name: Madison Maxwell MRN: [id number] Date of Birth: 12-18-1919 Referring Provider: Dr. Waynard Edwards  Encounter Date: 01/01/2016      PT End of Session - 01/01/16 1248    Visit Number 2   Number of Visits 9   Date for PT Re-Evaluation 01/24/16   Authorization Type G-code every 10th visit.   PT Start Time 1151   PT Stop Time 1230   PT Time Calculation (min) 39 min   Equipment Utilized During Treatment Gait belt   Activity Tolerance Patient tolerated treatment well   Behavior During Therapy WFL for tasks assessed/performed      Past Medical History  Diagnosis Date  . Hyperlipidemia   . Memory deficit   . Allergic rhinitis   . Duodenal ulcer   . External hemorrhoid   . Anxiety   . SUI (stress urinary incontinence), female   . Osteoarthritis   . Osteoporosis   . Vitamin D deficiency     Past Surgical History  Procedure Laterality Date  . Vaginal hysterectomy    . Appendectomy      There were no vitals filed for this visit.  Visit Diagnosis:  Weakness of both lower extremities  Abnormality of gait  Unsteadiness  Coordination impairment      Subjective Assessment - 01/01/16 1155    Subjective Pt reported she has been knitting and cooking. Pt amb. with SPC today.    Patient is accompained by: Family member  husband   Pertinent History Osteoporosis, SUI, memory loss   Patient Stated Goals Get back to "normal", so I can get back to my yard work.    Currently in Pain? No/denies                              Balance Exercises - 01/01/16 1202    OTAGO PROGRAM   Head Movements Standing;5 reps   Neck Movements Sitting;5 reps   Back Extension Standing;5 reps   Trunk Movements Standing;5 reps   Ankle Movements 10 reps;Sitting   Knee Extensor 20  reps  red theraband   Knee Flexor 10 reps  no weight   Hip ABductor 10 reps  no weight   Ankle Plantorflexors 20 reps, support   Ankle Dorsiflexors 20 reps, support   Knee Bends 10 reps, support   Backwards Walking Support   Walking and Turning Around Assistive device  not added to HEP 2/2 decr. safety .   Sideways Walking Assistive device   Overall OTAGO Comments Pt required cues and demonstration for techinque.       Self Care:     PT Education - 01/01/16 1247    Education provided Yes   Education Details CVA prevention and signs/symptoms handout provided/educated pt. PT initiated OTAGO HEP.    Person(s) Educated Patient;Spouse   Methods Explanation;Demonstration;Tactile cues;Verbal cues;Handout   Comprehension Returned demonstration;Verbalized understanding;Need further instruction          PT Short Term Goals - 12/25/15 1721    PT SHORT TERM GOAL #1   Title same as LTGs           PT Long Term Goals - 01/01/16 1251    PT LONG TERM GOAL #1   Title Pt will be IND in performing HEP with assistance from spouse to improve  deficits listed above. Target date: 01/22/16   Status On-going   PT LONG TERM GOAL #2   Title Pt will improve BERG score to >/=46/56 to decr. falls risk. Target date: 01/22/16   Status On-going   PT LONG TERM GOAL #3   Title Pt will improve TUG with LRAD to </=13.5sec. to decr. falls risk. Target date: 01/22/16   Status On-going   PT LONG TERM GOAL #4   Title Pt will improve gait speed to >/=2.31ft/sec. with LRAD to safely amb. in the community. Target date: 01/22/16   Status On-going   PT LONG TERM GOAL #5   Title Pt will verbalize understanding of CVA risk factors/symptoms to decr. risk of another CVA. Target date: 01/22/16   Status On-going   PT LONG TERM GOAL #6   Title Pt will amb. 600' over even/uneven terrain at MOD I level with LRAD to improve functional mobility and allow pt to work out in yard. Target date: 01/22/16   Status On-going                Plan - 01/01/16 1248    Clinical Impression Statement Pt tolerated OTAGO well but did experience incr. postural sway during balance activities withtou UE support on counter. Pt may benefit from gait training with RW vs. SPC to improve balance during amb., as pt continues to hold on to husband while using SPC. Continue with POC.    Pt will benefit from skilled therapeutic intervention in order to improve on the following deficits Abnormal gait;Impaired sensation;Postural dysfunction;Impaired flexibility;Decreased safety awareness;Decreased coordination;Decreased balance;Decreased mobility;Decreased strength;Decreased knowledge of use of DME   Rehab Potential Good   Clinical Impairments Affecting Rehab Potential Osteoporosis, memory deficits, and decrease want to use AD   PT Frequency 2x / week   PT Duration 4 weeks   PT Treatment/Interventions ADLs/Self Care Home Management;Biofeedback;Balance training;Manual techniques;Therapeutic exercise;Therapeutic activities;Orthotic Fit/Training;Functional mobility training;Stair training;Gait training;DME Instruction;Patient/family education;Neuromuscular re-education   PT Next Visit Plan Finish OTAGO HEP and review OTAGO activities performed last session prn. Gait training with RW and SPC.    PT Home Exercise Plan Initiated OTAGO HEP   Consulted and Agree with Plan of Care Patient;Family member/caregiver   Family Member Consulted husband: [name] (sp?)        Problem List Patient Active Problem List   Diagnosis Date Noted  . Left hand weakness   . Ataxia   . CVA (cerebral infarction) 11/09/2015  . Memory deficit     Juandiego Kolenovic L 01/01/2016, 12:52 PM  [place] [address] [place], [place], [address] Phone: [phone]   Fax:  [phone]  Name: Madison Maxwell MRN: [id number] Date of Birth: 1920-07-09    Zerita Boers, PT,DPT 01/01/2016 12:52  PM Phone: [phone] Fax: [phone]

## 2016-01-03 ENCOUNTER — Emergency Department (HOSPITAL_COMMUNITY): Payer: Medicare Other

## 2016-01-03 ENCOUNTER — Emergency Department (HOSPITAL_COMMUNITY)
Admission: EM | Admit: 2016-01-03 | Discharge: 2016-01-03 | Disposition: A | Discharge status: Home / Self Care | Payer: Medicare Other | Attending: Emergency Medicine | Admitting: Emergency Medicine

## 2016-01-03 ENCOUNTER — Encounter (HOSPITAL_COMMUNITY): Payer: Self-pay | Admitting: *Deleted

## 2016-01-03 ENCOUNTER — Ambulatory Visit: Payer: PPO

## 2016-01-03 DIAGNOSIS — Z7982 Long term (current) use of aspirin: Secondary | ICD-10-CM | POA: Diagnosis not present

## 2016-01-03 DIAGNOSIS — R531 Weakness: Secondary | ICD-10-CM

## 2016-01-03 DIAGNOSIS — Z79899 Other long term (current) drug therapy: Secondary | ICD-10-CM | POA: Insufficient documentation

## 2016-01-03 DIAGNOSIS — Z8659 Personal history of other mental and behavioral disorders: Secondary | ICD-10-CM | POA: Diagnosis not present

## 2016-01-03 DIAGNOSIS — M1990 Unspecified osteoarthritis, unspecified site: Secondary | ICD-10-CM | POA: Diagnosis not present

## 2016-01-03 DIAGNOSIS — R5383 Other fatigue: Secondary | ICD-10-CM | POA: Diagnosis not present

## 2016-01-03 DIAGNOSIS — Z8719 Personal history of other diseases of the digestive system: Secondary | ICD-10-CM | POA: Insufficient documentation

## 2016-01-03 DIAGNOSIS — R270 Ataxia, unspecified: Secondary | ICD-10-CM

## 2016-01-03 DIAGNOSIS — E785 Hyperlipidemia, unspecified: Secondary | ICD-10-CM | POA: Diagnosis not present

## 2016-01-03 DIAGNOSIS — M810 Age-related osteoporosis without current pathological fracture: Secondary | ICD-10-CM | POA: Insufficient documentation

## 2016-01-03 LAB — COMPREHENSIVE METABOLIC PANEL
ALT: 25 U/L (ref 14–54)
AST: 27 U/L (ref 15–41)
Albumin: 4.1 g/dL (ref 3.5–5.0)
Alkaline Phosphatase: 48 U/L (ref 38–126)
Anion gap: 11 (ref 5–15)
BILIRUBIN TOTAL: 0.6 mg/dL (ref 0.3–1.2)
BUN: 20 mg/dL (ref 6–20)
CO2: 27 mmol/L (ref 22–32)
CREATININE: 0.62 mg/dL (ref 0.44–1.00)
Calcium: 9.6 mg/dL (ref 8.9–10.3)
Chloride: 105 mmol/L (ref 101–111)
GFR calc Af Amer: >60 mL/min (ref >60)
Glucose, Bld: 107 mg/dL — ABNORMAL HIGH (ref 65–99)
Potassium: 4.3 mmol/L (ref 3.5–5.1)
Sodium: 143 mmol/L (ref 135–145)
TOTAL PROTEIN: 6.3 g/dL — AB (ref 6.5–8.1)

## 2016-01-03 LAB — CBC
HEMATOCRIT: 37.7 % (ref 36.0–46.0)
HEMOGLOBIN: 12.6 g/dL (ref 12.0–15.0)
MCH: 31.7 pg (ref 26.0–34.0)
MCHC: 33.4 g/dL (ref 30.0–36.0)
MCV: 95 fL (ref 78.0–100.0)
Platelets: 137 10*3/uL — ABNORMAL LOW (ref 150–400)
RBC: 3.97 MIL/uL (ref 3.87–5.11)
RDW: 12.5 % (ref 11.5–15.5)
WBC: 5.2 10*3/uL (ref 4.0–10.5)

## 2016-01-03 LAB — URINALYSIS, ROUTINE W REFLEX MICROSCOPIC
Bilirubin Urine: NEGATIVE
GLUCOSE, UA: NEGATIVE mg/dL
HGB URINE DIPSTICK: NEGATIVE
KETONES UR: NEGATIVE mg/dL
Nitrite: NEGATIVE
PROTEIN: NEGATIVE mg/dL
Specific Gravity, Urine: 1.012 (ref 1.005–1.030)
pH: 7.5 (ref 5.0–8.0)

## 2016-01-03 LAB — I-STAT CHEM 8, ED
BUN: 23 mg/dL — ABNORMAL HIGH (ref 6–20)
CREATININE: 0.6 mg/dL (ref 0.44–1.00)
Calcium, Ion: 1.13 mmol/L (ref 1.13–1.30)
Chloride: 103 mmol/L (ref 101–111)
GLUCOSE: 104 mg/dL — AB (ref 65–99)
HCT: 37 % (ref 36.0–46.0)
HEMOGLOBIN: 12.6 g/dL (ref 12.0–15.0)
Potassium: 4.1 mmol/L (ref 3.5–5.1)
Sodium: 142 mmol/L (ref 135–145)
TCO2: 28 mmol/L (ref 0–100)

## 2016-01-03 LAB — PROTIME-INR
INR: 1.04 (ref 0.00–1.49)
Prothrombin Time: 13.8 seconds (ref 11.6–15.2)

## 2016-01-03 LAB — DIFFERENTIAL
BASOS ABS: 0 10*3/uL (ref 0.0–0.1)
Basophils Relative: 1 %
EOS ABS: 0.5 10*3/uL (ref 0.0–0.7)
Eosinophils Relative: 10 %
LYMPHS ABS: 1.3 10*3/uL (ref 0.7–4.0)
Lymphocytes Relative: 24 %
MONOS PCT: 8 %
Monocytes Absolute: 0.4 10*3/uL (ref 0.1–1.0)
NEUTROS ABS: 3 10*3/uL (ref 1.7–7.7)
Neutrophils Relative %: 57 %

## 2016-01-03 LAB — APTT: APTT: 29 s (ref 24–37)

## 2016-01-03 LAB — URINE MICROSCOPIC-ADD ON: RBC / HPF: NONE SEEN RBC/hpf (ref 0–5)

## 2016-01-03 LAB — I-STAT TROPONIN, ED: TROPONIN I, POC: 0 ng/mL (ref 0.00–0.08)

## 2016-01-03 NOTE — Discharge Instructions (Signed)
Read the information below.  You may return to the Emergency Department at any time for worsening condition or any new symptoms that concern you.   Weakness Weakness is a lack of strength. It may be felt all over the body (generalized) or in one specific part of the body (focal). Some causes of weakness can be serious. You may need further medical evaluation, especially if you are elderly or you have a history of immunosuppression (such as chemotherapy or HIV), kidney disease, heart disease, or diabetes. CAUSES  Weakness can be caused by many different things, including:  Infection.  Physical exhaustion.  Internal bleeding or other blood loss that results in a lack of red blood cells (anemia).  Dehydration. This cause is more common in elderly people.  Side effects or electrolyte abnormalities from medicines, such as pain medicines or sedatives.  Emotional distress, anxiety, or depression.  Circulation problems, especially severe peripheral arterial disease.  Heart disease, such as rapid atrial fibrillation, bradycardia, or heart failure.  Nervous system disorders, such as Guillain-Barr syndrome, multiple sclerosis, or stroke. DIAGNOSIS  To find the cause of your weakness, your caregiver will take your history and perform a physical exam. Lab tests or X-rays may also be ordered, if needed. TREATMENT  Treatment of weakness depends on the cause of your symptoms and can vary greatly. HOME CARE INSTRUCTIONS   Rest as needed.  Eat a well-balanced diet.  Try to get some exercise every day.  Only take over-the-counter or prescription medicines as directed by your caregiver. SEEK MEDICAL CARE IF:   Your weakness seems to be getting worse or spreads to other parts of your body.  You develop new aches or pains. SEEK IMMEDIATE MEDICAL CARE IF:   You cannot perform your normal daily activities, such as getting dressed and feeding yourself.  You cannot walk up and down stairs, or  you feel exhausted when you do so.  You have shortness of breath or chest pain.  You have difficulty moving parts of your body.  You have weakness in only one area of the body or on only one side of the body.  You have a fever.  You have trouble speaking or swallowing.  You cannot control your bladder or bowel movements.  You have black or bloody vomit or stools. MAKE SURE YOU:  Understand these instructions.  Will watch your condition.  Will get help right away if you are not doing well or get worse.   This information is not intended to replace advice given to you by your health care provider. Make sure you discuss any questions you have with your health care provider.   Document Released: 11/17/2005 Document Revised: 05/18/2012 Document Reviewed: 01/16/2012 Elsevier Interactive Patient Education 2016 [employer].   Fall Prevention in the Home  Falls can cause injuries and can affect people from all age groups. There are many simple things that you can do to make your home safe and to help prevent falls. WHAT CAN I DO ON THE OUTSIDE OF MY HOME?  Regularly repair the edges of walkways and driveways and fix any cracks.  Remove high doorway thresholds.  Trim any shrubbery on the main path into your home.  Use bright outdoor lighting.  Clear walkways of debris and clutter, including tools and rocks.  Regularly check that handrails are securely fastened and in good repair. Both sides of any steps should have handrails.  Install guardrails along the edges of any raised decks or porches.  Have leaves,  snow, and ice cleared regularly.  Use sand or salt on walkways during winter months.  In the garage, clean up any spills right away, including grease or oil spills. WHAT CAN I DO IN THE BATHROOM?  Use night lights.  Install grab bars by the toilet and in the tub and shower. Do not use towel bars as grab bars.  Use non-skid mats or decals on the floor of the tub or  shower.  If you need to sit down while you are in the shower, use a plastic, non-slip stool.[employer]  Keep the floor dry. Immediately clean up any water that spills on the floor.  Remove soap buildup in the tub or shower on a regular basis.  Attach bath mats securely with double-sided non-slip rug tape.  Remove throw rugs and other tripping hazards from the floor. WHAT CAN I DO IN THE BEDROOM?  Use night lights.  Make sure that a bedside light is easy to reach.  Do not use oversized bedding that drapes onto the floor.  Have a firm chair that has side arms to use for getting dressed.  Remove throw rugs and other tripping hazards from the floor. WHAT CAN I DO IN THE KITCHEN?   Clean up any spills right away.  Avoid walking on wet floors.  Place frequently used items in easy-to-reach places.  If you need to reach for something above you, use a sturdy step stool that has a grab bar.  Keep electrical cables out of the way.  Do not use floor polish or wax that makes floors slippery. If you have to use wax, make sure that it is non-skid floor wax.  Remove throw rugs and other tripping hazards from the floor. WHAT CAN I DO IN THE STAIRWAYS?  Do not leave any items on the stairs.  Make sure that there are handrails on both sides of the stairs. Fix handrails that are broken or loose. Make sure that handrails are as long as the stairways.  Check any carpeting to make sure that it is firmly attached to the stairs. Fix any carpet that is loose or worn.  Avoid having throw rugs at the top or bottom of stairways, or secure the rugs with carpet tape to prevent them from moving.  Make sure that you have a light switch at the top of the stairs and the bottom of the stairs. If you do not have them, have them installed. WHAT ARE SOME OTHER FALL PREVENTION TIPS?  Wear closed-toe shoes that fit well and support your feet. Wear shoes that have rubber soles or low heels.  When you use a  stepladder, make sure that it is completely opened and that the sides are firmly locked. Have someone hold the ladder while you are using it. Do not climb a closed stepladder.  Add color or contrast paint or tape to grab bars and handrails in your home. Place contrasting color strips on the first and last steps.  Use mobility aids as needed, such as canes, walkers, scooters, and crutches.  Turn on lights if it is dark. Replace any light bulbs that burn out.  Set up furniture so that there are clear paths. Keep the furniture in the same spot.  Fix any uneven floor surfaces.  Choose a carpet design that does not hide the edge of steps of a stairway.  Be aware of any and all pets.  Review your medicines with your healthcare provider. Some medicines can cause dizziness or changes in  blood pressure, which increase your risk of falling. Talk with your health care provider about other ways that you can decrease your risk of falls. This may include working with a physical therapist or trainer to improve your strength, balance, and endurance.   This information is not intended to replace advice given to you by your health care provider. Make sure you discuss any questions you have with your health care provider.   Document Released: 11/07/2002 Document Revised: 04/03/2015 Document Reviewed: 12/22/2014 Elsevier Interactive Patient Education 2016 [employer]  Ataxia Ataxia is a condition that results in unsteadiness when walking and standing, poor coordination of body movements, and difficulty maintaining an upright posture. It occurs due to a problem with the part of your brain that controls coordination and stability (cerebellar dysfunction).  CAUSES  Ataxia can develop later in life (acquired ataxia) during your 20s to 30s, and even as late as into your 60s or beyond. Acquired ataxia may be caused by:  Changes in your nervous system (neurodegenerative).  Changes throughout your body  (systemic disorders).  Excess exposure to:  Medicines, such as phenytoin and lithium.  Solvents.  Abuse of alcohol (alcoholism).  Medical conditions, such as:  Celiac sprue.  Hypothyroidism.  Vitamin E deficiency.  Structural brain abnormalities, such as tumors.  Multiple sclerosis.  Stroke.  Head injury. Ataxia may also be present early in life (non-acquired ataxia). There are two main types of non-acquired ataxia:  Cerebellar dysfunction present at birth (congenital).  Family inheritance (genetic heredity). Friedreich ataxia is the most common form of hereditary ataxia. SIGNS AND SYMPTOMS The signs and symptoms of ataxia can vary depending on how severe the condition is that causes it. Signs and symptoms may include:  Unsteadiness.  Walking with a wide stance.  Tremor.  Poorly coordinated body movements.  Difficulty maintaining a straight (upright) posture.  Fatigue.  Changes in your speech.  Changes in your vision.  Difficulty swallowing.  Difficulty with writing.  Decreased mental status (dementia).  Muscle spasms. DIAGNOSIS  Ataxia is diagnosed by discussing your personal and family history and through a physical exam. You may also have additional tests such as:  MRI.  Genetic testing. TREATMENT  Treatment for ataxia may include treating or removing the underlying condition causing the ataxia. Surgery may be required if a structural abnormality in your brain is causing the ataxia. Otherwise, supportive treatments may be used to manage your symptoms. HOME CARE INSTRUCTIONS Monitor your ataxia for any changes. The following actions may help any discomfort you are experiencing:   Do not drink alcohol.  Lie down right away if you become very unsteady, dizzy, nauseated, or feel like you are going to faint. Wait until all of these feelings pass before you get up again. SEEK IMMEDIATE MEDICAL CARE IF:  Your unsteadiness suddenly worsens.  You  develop severe headaches, chest pain, or abdominal pain.   You have weakness or numbness on one side of your body.   You have problems with your vision.   You feel confused.   You have difficulty speaking.   You have an irregular heartbeat or a very fast pulse.    This information is not intended to replace advice given to you by your health care provider. Make sure you discuss any questions you have with your health care provider.   Document Released: 06/14/2014 Document Reviewed: 06/14/2014 Elsevier Interactive Patient Education [employer]

## 2016-01-03 NOTE — ED Provider Notes (Signed)
CSN: [id number]     Arrival date & time 01/03/16  1622 History   First MD Initiated Contact with Patient 01/03/16 1732     Chief Complaint  Patient presents with  . Weakness     (Consider location/radiation/quality/duration/timing/severity/associated sxs/prior Treatment) HPI   Pt with hx CVA presents with increased weakness and fatigue over the past few days.  Has been undergoing physical therapy for her gait problem since she had a CVA in December 2016.  States she is not improving as fast as she would like.  Family notes she has seemed more fatigued than usual and more unstable on her feet.  Denies fever, recent illness, pain anywhere, cough, vomiting, diarrhea, urinary symptoms.  She eats and drinks well.    Has appt with Dr Waynard Edwards tomorrow but family got worried and wanted to get checked.     Past Medical History  Diagnosis Date  . Hyperlipidemia   . Memory deficit   . Allergic rhinitis   . Duodenal ulcer   . External hemorrhoid   . Anxiety   . SUI (stress urinary incontinence), female   . Osteoarthritis   . Osteoporosis   . Vitamin D deficiency    Past Surgical History  Procedure Laterality Date  . Vaginal hysterectomy    . Appendectomy     Family History  Problem Relation Age of Onset  . Colon cancer Neg Hx   . Colon polyps Sister    Social History  Substance Use Topics  . Smoking status: Never Smoker   . Smokeless tobacco: Never Used  . Alcohol Use: No   OB History    No data available     Review of Systems  All other systems reviewed and are negative.     Allergies  Review of patient's allergies indicates no known allergies.  Home Medications   Prior to Admission medications   Medication Sig Start Date End Date Taking? Authorizing Provider  aspirin EC 325 MG EC tablet Take 1 tablet (325 mg total) by mouth daily. 11/12/15   Jeralyn Bennett, MD  atorvastatin (LIPITOR) 20 MG tablet Take 1 tablet (20 mg total) by mouth daily at 6 PM. 11/12/15    Jeralyn Bennett, MD  denosumab (PROLIA) 60 MG/ML SOLN injection Inject 60 mg into the skin every 6 (six) months. Administer in upper arm, thigh, or abdomen    Historical Provider, MD  Multiple Vitamins-Minerals (MULTIVITAMINS THER. W/MINERALS) TABS Take 1 tablet by mouth daily.    Historical Provider, MD  omega-3 acid ethyl esters (LOVAZA) 1 G capsule Take 2 g by mouth 2 (two) times daily.    Historical Provider, MD  rivastigmine (EXELON) 9.5 mg/24hr Place 9.5 mg onto the skin daily.  08/27/15   Historical Provider, MD  vitamin B-12 (CYANOCOBALAMIN) 1000 MCG tablet Take 1,000 mcg by mouth daily.    Historical Provider, MD  vitamin C (ASCORBIC ACID) 500 MG tablet Take 500 mg by mouth daily.    Historical Provider, MD  zolpidem (AMBIEN) 5 MG tablet Take 5 mg by mouth at bedtime.  12/11/15   Historical Provider, MD   BP 146/62 mmHg  Pulse 76  Temp(Src) 97.7 F (36.5 C) (Oral)  Resp 18  Ht 5\' 5"  (1.651 m)  Wt 45.813 kg  BMI 16.81 kg/m2  SpO2 98% Physical Exam  Constitutional: She appears well-developed and well-nourished. No distress.  HENT:  Head: Normocephalic and atraumatic.  Eyes: Conjunctivae are normal.  Neck: Normal range of motion. Neck supple.  Cardiovascular: Normal  rate and regular rhythm.   Pulmonary/Chest: Effort normal and breath sounds normal. No respiratory distress. She has no wheezes. She has no rales.  Abdominal: Soft. She exhibits no distension. There is no tenderness. There is no rebound and no guarding.  Neurological: She is alert. No cranial nerve deficit. She exhibits normal muscle tone. GCS eye subscore is 4. GCS verbal subscore is 5. GCS motor subscore is 6.  Moves all extremities equally, sensation intact.  Coordination testing somewhat controlled with effort, daughter notes it is much improved.    Skin: She is not diaphoretic.  Nursing note and vitals reviewed.   ED Course  Procedures (including critical care time) Labs Review Labs Reviewed  CBC - Abnormal;  Notable for the following:    Platelets 137 (*)    All other components within normal limits  COMPREHENSIVE METABOLIC PANEL - Abnormal; Notable for the following:    Glucose, Bld 107 (*)    Total Protein 6.3 (*)    All other components within normal limits  URINALYSIS, ROUTINE W REFLEX MICROSCOPIC (NOT AT [facility]) - Abnormal; Notable for the following:    Leukocytes, UA TRACE (*)    All other components within normal limits  URINE MICROSCOPIC-ADD ON - Abnormal; Notable for the following:    Squamous Epithelial / LPF 0-5 (*)    Bacteria, UA RARE (*)    All other components within normal limits  I-STAT CHEM 8, ED - Abnormal; Notable for the following:    BUN 23 (*)    Glucose, Bld 104 (*)    All other components within normal limits  URINE CULTURE  PROTIME-INR  APTT  DIFFERENTIAL  I-STAT TROPOININ, ED  CBG MONITORING, ED    Imaging Review Dg Chest 2 View  01/03/2016  CLINICAL DATA:  82 year old presenting with 3 day history of bilateral lower extremity weakness and pain. EXAM: CHEST  2 VIEW COMPARISON:  02/07/2008. FINDINGS: Cardiac silhouette mildly enlarged, unchanged. Thoracic aorta mildly atherosclerotic, unchanged. Hilar and mediastinal contours otherwise unremarkable. Mild hyperinflation, unchanged. Linear scarring in the lower lobes. Lungs otherwise clear. Bronchovascular markings normal. No localized airspace consolidation. No pleural effusions. No pneumothorax. Normal pulmonary vascularity. Degenerative changes involving the visualized upper lumbar spine. IMPRESSION: No acute cardiopulmonary disease. Electronically Signed   By: Hulan Saas M.D.   On: 01/03/2016 18:20   Ct Head Wo Contrast  01/03/2016  CLINICAL DATA:  82 year old with acute right frontal white matter strokes in December, 2016, presenting today with bilateral lower extremity weakness. EXAM: CT HEAD WITHOUT CONTRAST TECHNIQUE: Contiguous axial images were obtained from the base of the skull through the vertex  without intravenous contrast. COMPARISON:  MRI brain 11/10/2015.  Head CT 11/09/2015, 10/25/2012. FINDINGS: Mild to moderate age related cortical and deep atrophy. Moderate to severe changes of small vessel disease of the white matter, unchanged. Remote stroke in the deep white matter of the inferior right frontal lobe corresponding to the MRI finding on 11/10/2015. No mass lesion. No midline shift. No acute hemorrhage or hematoma. No extra-axial fluid collections. No evidence of acute infarction. No skull fracture or other focal osseous abnormality involving the skull. Visualized paranasal sinuses, bilateral mastoid air cells and bilateral middle ear cavities well-aerated. Moderate bilateral carotid siphon atherosclerosis. IMPRESSION: 1. No acute intracranial abnormality. 2. Deep white matter stroke in the inferior right frontal lobe, corresponding to the MRI finding on 11/10/2015. 3. Stable mild to moderate age related generalized atrophy and moderate to severe chronic microvascular ischemic changes of the white matter.  Electronically Signed   By: Hulan Saas M.D.   On: 01/03/2016 17:11      EKG Interpretation   Date/Time:  Thursday January 03 2016 16:35:12 EST Ventricular Rate:  73 PR Interval:  126 QRS Duration: 122 QT Interval:  394 QTC Calculation: 434 R Axis:   67 Text Interpretation:  Normal sinus rhythm Non-specific intra-ventricular  conduction delay Nonspecific ST abnormality Abnormal ECG Confirmed by  BEATON  MD, ROBERT ([id number]) on 01/03/2016 6:37:08 PM       8:14 PM Pt is dressed and eager to go.  States she has a walker and a cane at home that she uses as needed.  No further needs at this time.  Will follow up with Dr Waynard Edwards tomorrow.    MDM   Final diagnoses:  Ataxia  Generalized weakness    Pt with hx CVA, ataxia, memory impairment, brought into ED for several days of fatigue, increased weakness.  She has been participating in outpatient PT and does not feel she is  getting back to her baseline quickly enough.  Denies any other symptoms.  Workup reassuring.  She has follow up with her PCP Dr Waynard Edwards tomorrow.  States she can ambulate without difficulty, only uses cane and walker as needed.  Pt declines home health services - is comfortable leaving the house for PT twice weekly.  D/C home with close PCP follow up.  Discussed result, findings, treatment, and follow up  with patient.  Pt given return precautions.  Pt verbalizes understanding and agrees with plan.        Trixie Dredge, PA-C 01/03/16 2035  Nelva Nay, MD 01/06/16 [phone]

## 2016-01-03 NOTE — ED Notes (Signed)
Pt had a stroke dec 5th

## 2016-01-03 NOTE — ED Notes (Signed)
Patient verbalized understanding of discharge instructions and denies any further needs or questions at this time. VS stable. Patient ambulatory with steady gait. Assisted to ED entrance in wheelchair.   

## 2016-01-03 NOTE — ED Notes (Signed)
EDP at bedside  

## 2016-01-03 NOTE — ED Notes (Signed)
Patient ambulated to bathroom without difficulty.

## 2016-01-03 NOTE — ED Notes (Signed)
The pt is c/o bi-lateral leg weakness for 3 days.  No speech difficulty no pain anywhere.  Alert no distress

## 2016-01-05 LAB — URINE CULTURE

## 2016-01-08 ENCOUNTER — Ambulatory Visit: Payer: Medicare Other | Attending: Internal Medicine

## 2016-01-08 DIAGNOSIS — R2681 Unsteadiness on feet: Secondary | ICD-10-CM | POA: Diagnosis present

## 2016-01-08 DIAGNOSIS — R269 Unspecified abnormalities of gait and mobility: Secondary | ICD-10-CM | POA: Insufficient documentation

## 2016-01-08 DIAGNOSIS — R29898 Other symptoms and signs involving the musculoskeletal system: Secondary | ICD-10-CM | POA: Diagnosis present

## 2016-01-08 DIAGNOSIS — R278 Other lack of coordination: Secondary | ICD-10-CM | POA: Insufficient documentation

## 2016-01-08 NOTE — Therapy (Signed)
[facility] [address] [place], [place], [address] Phone: [phone]   Fax:  [phone]  Physical Therapy Treatment  Patient Details  Name: Madison Maxwell MRN: [id number] Date of Birth: July 19, 1920 Referring Provider: Dr. Waynard Edwards  Encounter Date: 01/08/2016      PT End of Session - 01/08/16 1244    Visit Number 3   Number of Visits 9   Date for PT Re-Evaluation 01/24/16   Authorization Type G-code every 10th visit.   PT Start Time 1117   PT Stop Time 1144   PT Time Calculation (min) 27 min   Equipment Utilized During Treatment Gait belt   Activity Tolerance Patient tolerated treatment well   Behavior During Therapy WFL for tasks assessed/performed      Past Medical History  Diagnosis Date  . Hyperlipidemia   . Memory deficit   . Allergic rhinitis   . Duodenal ulcer   . External hemorrhoid   . Anxiety   . SUI (stress urinary incontinence), female   . Osteoarthritis   . Osteoporosis   . Vitamin D deficiency     Past Surgical History  Procedure Laterality Date  . Vaginal hysterectomy    . Appendectomy      There were no vitals filed for this visit.  Visit Diagnosis:  Abnormality of gait  Unsteadiness  Coordination impairment  Weakness of both lower extremities      Subjective Assessment - 01/08/16 1121    Subjective Pt reported she went to the hospital on 01/03/16, as pt's husband noticed that she was leaning to the left and having difficulty walking due to poor balance. Pt reported MD cleared pt for CVA and did not admit pt to the hospital. Pt reports she feels a little better now than when she went to the ED.    Patient is accompained by: Family member  husband   Pertinent History Osteoporosis, SUI, memory loss   Patient Stated Goals Get back to "normal", so I can get back to my yard work.    Currently in Pain? No/denies                              Balance Exercises  - 01/08/16 1125    OTAGO PROGRAM   Tandem Stance 10 seconds, support   Tandem Walk Support   One Leg Stand 10 seconds, support  performed with 1-2 UE support   Heel Walking Support  pt required min guard    Toe Walk Support  pt required min guard to min A to maintain balance   Heel Toe Walking Backward --  with support   Sit to Stand 10 reps, bilateral support   Overall OTAGO Comments PT reiterated to perform only the former OTAGO HEP, as pt did not bring her OTAGO book and PT could not customize the last 7 exercises for safety. Pt required cues for technique.           PT Education - 01/08/16 1242    Education provided Yes   Education Details PT educated pt on technique during last 7 OTAGO exercises. PT discussed the importance of performing OTAGO only 3x/week, as to not fatigue muscles. PT requested pt bring back her OTAGO HEP book in order to customize remaining exercises.   Person(s) Educated Patient;Spouse   Methods Explanation;Demonstration;Verbal cues;Handout   Comprehension Returned demonstration;Verbalized understanding;Need further instruction          PT  Short Term Goals - 12/25/15 1721    PT SHORT TERM GOAL #1   Title same as LTGs           PT Long Term Goals - 01/01/16 1251    PT LONG TERM GOAL #1   Title Pt will be IND in performing HEP with assistance from spouse to improve deficits listed above. Target date: 01/22/16   Status On-going   PT LONG TERM GOAL #2   Title Pt will improve BERG score to >/=46/56 to decr. falls risk. Target date: 01/22/16   Status On-going   PT LONG TERM GOAL #3   Title Pt will improve TUG with LRAD to </=13.5sec. to decr. falls risk. Target date: 01/22/16   Status On-going   PT LONG TERM GOAL #4   Title Pt will improve gait speed to >/=2.53ft/sec. with LRAD to safely amb. in the community. Target date: 01/22/16   Status On-going   PT LONG TERM GOAL #5   Title Pt will verbalize understanding of CVA risk factors/symptoms to  decr. risk of another CVA. Target date: 01/22/16   Status On-going   PT LONG TERM GOAL #6   Title Pt will amb. 600' over even/uneven terrain at MOD I level with LRAD to improve functional mobility and allow pt to work out in yard. Target date: 01/22/16   Status On-going               Plan - 01/08/16 1244    Clinical Impression Statement Pt's treatment time limited, as pt arrived for her 1:15pm appt at 11:15am, as she got the times mixed up. Therefore, session was shortened, as pt did not wish to go home and come back to afternoon appt. Pt experinced incr. postural sway during toe walking exercise and required moderate VC's and tactile cues for foot placement during sit to stand. Continue with POC.   Pt will benefit from skilled therapeutic intervention in order to improve on the following deficits Abnormal gait;Impaired sensation;Postural dysfunction;Impaired flexibility;Decreased safety awareness;Decreased coordination;Decreased balance;Decreased mobility;Decreased strength;Decreased knowledge of use of DME   Rehab Potential Good   Clinical Impairments Affecting Rehab Potential Osteoporosis, memory deficits, and decrease want to use AD   PT Frequency 2x / week   PT Duration 4 weeks   PT Treatment/Interventions ADLs/Self Care Home Management;Biofeedback;Balance training;Manual techniques;Therapeutic exercise;Therapeutic activities;Orthotic Fit/Training;Functional mobility training;Stair training;Gait training;DME Instruction;Patient/family education;Neuromuscular re-education   PT Next Visit Plan Finish OTAGO HEP and review OTAGO activities performed last session prn. Gait training with RW and SPC.    PT Home Exercise Plan Initiated OTAGO HEP   Consulted and Agree with Plan of Care Patient;Family member/caregiver   Family Member Consulted husband: [name] (sp?)        Problem List Patient Active Problem List   Diagnosis Date Noted  . Left hand weakness   . Ataxia   . CVA (cerebral  infarction) 11/09/2015  . Memory deficit     Dasia Guerrier L 01/08/2016, 12:46 PM  [facility] [address] [place], [place], [address] Phone: [phone]   Fax:  [phone]  Name: Madison Maxwell MRN: [id number] Date of Birth: [birth year]    Zerita Boers, PT,DPT 01/08/2016 12:46 PM Phone: [phone] Fax: [phone]

## 2016-01-10 ENCOUNTER — Ambulatory Visit: Payer: Medicare Other

## 2016-01-10 DIAGNOSIS — R269 Unspecified abnormalities of gait and mobility: Secondary | ICD-10-CM | POA: Diagnosis not present

## 2016-01-10 DIAGNOSIS — R2681 Unsteadiness on feet: Secondary | ICD-10-CM

## 2016-01-10 NOTE — Therapy (Signed)
[facility] [address] [place], [place], [address] Phone: [phone]   Fax:  [phone]  Physical Therapy Treatment  Patient Details  Name: Madison Maxwell MRN: [id number] Date of Birth: Aug 07, 1920 Referring Provider: Dr. Waynard Edwards  Encounter Date: 01/10/2016      PT End of Session - 01/10/16 1132    Visit Number 4   Number of Visits 9   Date for PT Re-Evaluation 01/24/16   Authorization Type G-code every 10th visit.   PT Start Time [phone]   PT Stop Time 0928   PT Time Calculation (min) 41 min   Equipment Utilized During Treatment Gait belt   Activity Tolerance Patient tolerated treatment well   Behavior During Therapy WFL for tasks assessed/performed      Past Medical History  Diagnosis Date  . Hyperlipidemia   . Memory deficit   . Allergic rhinitis   . Duodenal ulcer   . External hemorrhoid   . Anxiety   . SUI (stress urinary incontinence), female   . Osteoarthritis   . Osteoporosis   . Vitamin D deficiency     Past Surgical History  Procedure Laterality Date  . Vaginal hysterectomy    . Appendectomy      There were no vitals filed for this visit.  Visit Diagnosis:  Abnormality of gait  Unsteadiness      Subjective Assessment - 01/10/16 0849    Subjective Pt denied falls or changes since last visit. Pt reported she was able to get out in the yard and rake leaves.   Patient is accompained by: Family member  husband   Pertinent History Osteoporosis, SUI, memory loss   Patient Stated Goals Get back to "normal", so I can get back to my yard work.    Currently in Pain? No/denies                         [facility] Adult PT Treatment/Exercise - 01/10/16 0856    Ambulation/Gait   Ambulation/Gait Yes   Ambulation/Gait Assistance 5: Supervision   Ambulation/Gait Assistance Details Cues for sequencing with SPC. Cues for improved stride length and to decr. narrow BOS.   Ambulation  Distance (Feet) 117 Feet  50'x2 with RW; 50'x2, 230'x2, 75'x2 with SPC   Assistive device Straight cane;Rolling walker   Gait Pattern Step-through pattern;Decreased stride length;Decreased weight shift to left;Narrow base of support;Decreased trunk rotation   Ambulation Surface Level;Indoor   Gait velocity 2.60ft/sec. with SPC and 2.19ft/sec. with RW             Balance Exercises - 01/10/16 0926    Balance Exercises: Standing   Other Standing Exercises In // bars with min guard to S for safety and 1-2 UE support: pt performed toe amb. 6x7' with cues for technique and heel/toe raises with B LEs x10 reps with cues to decr. trunk flexion during heel raises.      Self Care:     PT Education - 01/10/16 1131    Education provided Yes   Education Details PT reviewed OTAGO activities and clarified instructions prn. PT modified pt's OTAGO folder to include additional activities performed last session and took out exercises that pt was NOT to perform.  PT reiterated the importance of using AD at all times, as pt reports she does not use AD when working in yard. PT also encouraged pt to use SPC with proper sequencing or pt should use RW if unable to sequence  with SPC.   Person(s) Educated Patient;Spouse   Methods Explanation   Comprehension Verbalized understanding          PT Short Term Goals - 12/25/15 1721    PT SHORT TERM GOAL #1   Title same as LTGs           PT Long Term Goals - 01/01/16 1251    PT LONG TERM GOAL #1   Title Pt will be IND in performing HEP with assistance from spouse to improve deficits listed above. Target date: 01/22/16   Status On-going   PT LONG TERM GOAL #2   Title Pt will improve BERG score to >/=46/56 to decr. falls risk. Target date: 01/22/16   Status On-going   PT LONG TERM GOAL #3   Title Pt will improve TUG with LRAD to </=13.5sec. to decr. falls risk. Target date: 01/22/16   Status On-going   PT LONG TERM GOAL #4   Title Pt will improve gait  speed to >/=2.65ft/sec. with LRAD to safely amb. in the community. Target date: 01/22/16   Status On-going   PT LONG TERM GOAL #5   Title Pt will verbalize understanding of CVA risk factors/symptoms to decr. risk of another CVA. Target date: 01/22/16   Status On-going   PT LONG TERM GOAL #6   Title Pt will amb. 600' over even/uneven terrain at MOD I level with LRAD to improve functional mobility and allow pt to work out in yard. Target date: 01/22/16   Status On-going               Plan - 01/10/16 1133    Clinical Impression Statement Pt demonstrated progress, as she was able to perform toe raises amb. with 1 UE support in // bars and progressed from min A to min guard. Pt demonstrated decr. postural sway during amb. once she was able to sequence properly with SPC. Pt would continue to benefit from skilled PT to improve safety during functional mobility.   Pt will benefit from skilled therapeutic intervention in order to improve on the following deficits Abnormal gait;Impaired sensation;Postural dysfunction;Impaired flexibility;Decreased safety awareness;Decreased coordination;Decreased balance;Decreased mobility;Decreased strength;Decreased knowledge of use of DME   Rehab Potential Good   Clinical Impairments Affecting Rehab Potential Osteoporosis, memory deficits, and decrease want to use AD   PT Frequency 2x / week   PT Duration 4 weeks   PT Treatment/Interventions ADLs/Self Care Home Management;Biofeedback;Balance training;Manual techniques;Therapeutic exercise;Therapeutic activities;Orthotic Fit/Training;Functional mobility training;Stair training;Gait training;DME Instruction;Patient/family education;Neuromuscular re-education   PT Next Visit Plan Gait training with [facility] outdoors/uneven terrain and balance activities.   PT Home Exercise Plan OTAGO HEP   Consulted and Agree with Plan of Care Patient;Family member/caregiver   Family Member Consulted husband: [name] (sp?)         Problem List Patient Active Problem List   Diagnosis Date Noted  . Left hand weakness   . Ataxia   . CVA (cerebral infarction) 11/09/2015  . Memory deficit     Cheyane Ayon L 01/10/2016, 11:37 AM  [facility] [address] [place], [place], [address] Phone: [phone]   Fax:  [phone]  Name: LAURINDA CARRENO MRN: [id number] Date of Birth: [birth year]    Zerita Boers, PT,DPT 01/10/2016 11:37 AM Phone: [phone] Fax: [phone]

## 2016-01-15 ENCOUNTER — Ambulatory Visit: Payer: Medicare Other

## 2016-01-15 DIAGNOSIS — R269 Unspecified abnormalities of gait and mobility: Secondary | ICD-10-CM | POA: Diagnosis not present

## 2016-01-15 DIAGNOSIS — R278 Other lack of coordination: Secondary | ICD-10-CM

## 2016-01-15 DIAGNOSIS — R2681 Unsteadiness on feet: Secondary | ICD-10-CM

## 2016-01-15 NOTE — Therapy (Signed)
[facility] [address] [place], [place], [address] Phone: [phone]   Fax:  [phone]  Physical Therapy Treatment  Patient Details  Name: Madison Maxwell MRN: [id number] Date of Birth: [birth year] Referring Provider: Dr. Waynard Edwards  Encounter Date: 01/15/2016      PT End of Session - 01/15/16 1427    Visit Number 5   Number of Visits 9   Date for PT Re-Evaluation 01/24/16   Authorization Type G-code every 10th visit.   PT Start Time 1016   PT Stop Time 1057   PT Time Calculation (min) 41 min   Equipment Utilized During Treatment Gait belt   Activity Tolerance Patient tolerated treatment well   Behavior During Therapy WFL for tasks assessed/performed      Past Medical History  Diagnosis Date  . Hyperlipidemia   . Memory deficit   . Allergic rhinitis   . Duodenal ulcer   . External hemorrhoid   . Anxiety   . SUI (stress urinary incontinence), female   . Osteoarthritis   . Osteoporosis   . Vitamin D deficiency     Past Surgical History  Procedure Laterality Date  . Vaginal hysterectomy    . Appendectomy      There were no vitals filed for this visit.  Visit Diagnosis:  Unsteadiness  Abnormality of gait  Coordination impairment      Subjective Assessment - 01/15/16 1020    Subjective Pt denied falls or changes since last visit.    Patient is accompained by: Family member  husband   Pertinent History Osteoporosis, SUI, memory loss   Patient Stated Goals Get back to "normal", so I can get back to my yard work.    Currently in Pain? No/denies                         [facility] Adult PT Treatment/Exercise - 01/15/16 1021    Ambulation/Gait   Ambulation/Gait Yes   Ambulation/Gait Assistance 5: Supervision;4: Min guard;4: Min assist   Ambulation/Gait Assistance Details Supervision while amb. over even terrain and min guard to min A during amb. outdoors over paved and grassy terrain.  Cues for sequencing, wt. shifting while traversing incline/decline, and to improve stride length and reduce narrrow BOS.    Ambulation Distance (Feet) --  500' outdoors and 345' indoors   Assistive device Straight cane   Gait Pattern Step-through pattern;Decreased stride length;Decreased weight shift to left;Narrow base of support;Decreased trunk rotation   Ambulation Surface Level;Unlevel;Indoor;Outdoor;Paved;Grass   High Level Balance   High Level Balance Activities Backward walking;Head turns;Other (comment)  forward amb. with head turns and eyes closed   High Level Balance Comments Pt performed balance activities without SPC but with min guard to min A, over blue and red mats: 4x7'/activity. Pt required cues for technique and to improve ant/lat wt. shifting.              Balance Exercises - 01/15/16 1425    Balance Exercises: Standing   Other Standing Exercises with min guard to min A pt performed single and double cone tape with B LE, 2x5cones/LE/activity. Cues to improve lat. wt. shifting and demonstration for technique.           PT Education - 01/15/16 1426    Education provided Yes   Education Details PT discussed the importance of using an AD at all times for safety.   Person(s) Educated Patient;Spouse   Methods Explanation  Comprehension Verbalized understanding          PT Short Term Goals - 12/25/15 1721    PT SHORT TERM GOAL #1   Title same as LTGs           PT Long Term Goals - 01/01/16 1251    PT LONG TERM GOAL #1   Title Pt will be IND in performing HEP with assistance from spouse to improve deficits listed above. Target date: 01/22/16   Status On-going   PT LONG TERM GOAL #2   Title Pt will improve BERG score to >/=46/56 to decr. falls risk. Target date: 01/22/16   Status On-going   PT LONG TERM GOAL #3   Title Pt will improve TUG with LRAD to </=13.5sec. to decr. falls risk. Target date: 01/22/16   Status On-going   PT LONG TERM GOAL #4    Title Pt will improve gait speed to >/=2.30ft/sec. with LRAD to safely amb. in the community. Target date: 01/22/16   Status On-going   PT LONG TERM GOAL #5   Title Pt will verbalize understanding of CVA risk factors/symptoms to decr. risk of another CVA. Target date: 01/22/16   Status On-going   PT LONG TERM GOAL #6   Title Pt will amb. 600' over even/uneven terrain at MOD I level with LRAD to improve functional mobility and allow pt to work out in yard. Target date: 01/22/16   Status On-going               Plan - 01/15/16 1427    Clinical Impression Statement Pt demonstrated progress, as she was able to amb. outdoors with [facility] and perform high level balance activities with min guard to min A. Pt required less cues throughout session to sequence properly with SPC. Continue with POC.    Pt will benefit from skilled therapeutic intervention in order to improve on the following deficits Abnormal gait;Impaired sensation;Postural dysfunction;Impaired flexibility;Decreased safety awareness;Decreased coordination;Decreased balance;Decreased mobility;Decreased strength;Decreased knowledge of use of DME   Rehab Potential Good   Clinical Impairments Affecting Rehab Potential Osteoporosis, memory deficits, and decrease want to use AD   PT Frequency 2x / week   PT Duration 4 weeks   PT Treatment/Interventions ADLs/Self Care Home Management;Biofeedback;Balance training;Manual techniques;Therapeutic exercise;Therapeutic activities;Orthotic Fit/Training;Functional mobility training;Stair training;Gait training;DME Instruction;Patient/family education;Neuromuscular re-education   PT Next Visit Plan Continue Gait training with [facility] outdoors/uneven terrain and balance activities.   PT Home Exercise Plan OTAGO HEP   Consulted and Agree with Plan of Care Patient;Family member/caregiver   Family Member Consulted husband: Madison Maxwell (sp?)        Problem List Patient Active Problem List   Diagnosis Date Noted   . Left hand weakness   . Ataxia   . CVA (cerebral infarction) 11/09/2015  . Memory deficit     Madison Maxwell 01/15/2016, 2:29 PM  [facility] [address] [place], [place], [address] Phone: [phone]   Fax:  [phone]  Name: Madison Maxwell MRN: [id number] Date of Birth: January 09, 1920    Zerita Boers, PT,DPT 01/15/2016 2:29 PM Phone: [phone] Fax: [phone]

## 2016-01-17 ENCOUNTER — Ambulatory Visit: Payer: Medicare Other

## 2016-01-17 DIAGNOSIS — R278 Other lack of coordination: Secondary | ICD-10-CM

## 2016-01-17 DIAGNOSIS — R269 Unspecified abnormalities of gait and mobility: Secondary | ICD-10-CM

## 2016-01-17 DIAGNOSIS — R29898 Other symptoms and signs involving the musculoskeletal system: Secondary | ICD-10-CM

## 2016-01-17 DIAGNOSIS — R2681 Unsteadiness on feet: Secondary | ICD-10-CM

## 2016-01-17 NOTE — Therapy (Signed)
[facility] [address] [place], [place], [address] Phone: [phone]   Fax:  [phone]  Physical Therapy Treatment  Patient Details  Name: Madison Maxwell MRN: [id number] Date of Birth: 02/20/1920 Referring Provider: Dr. Waynard Edwards  Encounter Date: 01/17/2016      PT End of Session - 01/17/16 1243    Visit Number 6   Number of Visits 9   Date for PT Re-Evaluation 01/24/16   Authorization Type G-code every 10th visit.   PT Start Time [phone]   PT Stop Time 1013   PT Time Calculation (min) 40 min   Equipment Utilized During Treatment Gait belt   Activity Tolerance Patient tolerated treatment well   Behavior During Therapy WFL for tasks assessed/performed      Past Medical History  Diagnosis Date  . Hyperlipidemia   . Memory deficit   . Allergic rhinitis   . Duodenal ulcer   . External hemorrhoid   . Anxiety   . SUI (stress urinary incontinence), female   . Osteoarthritis   . Osteoporosis   . Vitamin D deficiency     Past Surgical History  Procedure Laterality Date  . Vaginal hysterectomy    . Appendectomy      There were no vitals filed for this visit.  Visit Diagnosis:  Abnormality of gait  Unsteadiness  Coordination impairment  Weakness of both lower extremities      Subjective Assessment - 01/17/16 0937    Subjective Pt denied falls or changes since last session.   Patient is accompained by: Family member  husband   Pertinent History Osteoporosis, SUI, memory loss   Patient Stated Goals Get back to "normal", so I can get back to my yard work.    Currently in Pain? No/denies                         [facility] Adult PT Treatment/Exercise - 01/17/16 1238    Ambulation/Gait   Ambulation/Gait Yes   Ambulation/Gait Assistance 5: Supervision;4: Min guard   Ambulation/Gait Assistance Details Pt amb. with S while amb. with SPC to ensure safety. Pt amb. with min guard while amb.  without an AD, pt amb. without SPC 115' searching for playing cards (missed 4 cards) and 84' without looking for cards, and no SPC. Cues to improve narrow BOS and stride length.    Ambulation Distance (Feet) --  230' no cane, 115' and 150, 69' with SPC   Assistive device Straight cane;None   Gait Pattern Step-through pattern;Decreased stride length;Decreased weight shift to left;Narrow base of support;Decreased trunk rotation   Ambulation Surface Level;Indoor   Exercises   Exercises Knee/Hip   Knee/Hip Exercises: Standing   Wall Squat 5 sets;5 seconds  mini wall squat             Balance Exercises - 01/17/16 0938    Balance Exercises: Standing   Standing Eyes Opened Narrow base of support (BOS);Wide (BOA);Foam/compliant surface;5 reps;30 secs;Other (comment);Head turns   Standing Eyes Closed Wide (BOA);Narrow base of support (BOS);Foam/compliant surface;30 secs   Rockerboard Anterior/posterior;Lateral;Head turns;EO;EC;30 seconds;10 reps;Intermittent UE support;UE support  1-2 UE support progressed to no UE support   Other Standing Exercises With min guard to min A (during rockerboard) for safety.           PT Education - 01/17/16 1242    Education provided Yes   Education Details PT discussed potential d/c at the end of PT sessions next  week.    Person(s) Educated Patient;Spouse   Methods Explanation   Comprehension Verbalized understanding          PT Short Term Goals - 12/25/15 1721    PT SHORT TERM GOAL #1   Title same as LTGs           PT Long Term Goals - 01/01/16 1251    PT LONG TERM GOAL #1   Title Pt will be IND in performing HEP with assistance from spouse to improve deficits listed above. Target date: 01/22/16   Status On-going   PT LONG TERM GOAL #2   Title Pt will improve BERG score to >/=46/56 to decr. falls risk. Target date: 01/22/16   Status On-going   PT LONG TERM GOAL #3   Title Pt will improve TUG with LRAD to </=13.5sec. to decr. falls  risk. Target date: 01/22/16   Status On-going   PT LONG TERM GOAL #4   Title Pt will improve gait speed to >/=2.29ft/sec. with LRAD to safely amb. in the community. Target date: 01/22/16   Status On-going   PT LONG TERM GOAL #5   Title Pt will verbalize understanding of CVA risk factors/symptoms to decr. risk of another CVA. Target date: 01/22/16   Status On-going   PT LONG TERM GOAL #6   Title Pt will amb. 600' over even/uneven terrain at MOD I level with LRAD to improve functional mobility and allow pt to work out in yard. Target date: 01/22/16   Status On-going               Plan - 01/17/16 1243    Clinical Impression Statement Pt demonstrated progress as she was able to amb. over even terrain, no AD, without a LOB episode. Pt continues to experience incr. postural sway during activities which challenge vestibular system. Pt would continue to benefit from skilled PT to improve safety during functional mobility.    Pt will benefit from skilled therapeutic intervention in order to improve on the following deficits Abnormal gait;Impaired sensation;Postural dysfunction;Impaired flexibility;Decreased safety awareness;Decreased coordination;Decreased balance;Decreased mobility;Decreased strength;Decreased knowledge of use of DME   Rehab Potential Good   Clinical Impairments Affecting Rehab Potential Osteoporosis, memory deficits, and decrease want to use AD   PT Frequency 2x / week   PT Duration 4 weeks   PT Treatment/Interventions ADLs/Self Care Home Management;Biofeedback;Balance training;Manual techniques;Therapeutic exercise;Therapeutic activities;Orthotic Fit/Training;Functional mobility training;Stair training;Gait training;DME Instruction;Patient/family education;Neuromuscular re-education   PT Next Visit Plan Gait training without SPC. Begin to assess LTGs (begin with assessing independence in performing OTAGO).   PT Home Exercise Plan OTAGO HEP   Consulted and Agree with Plan of Care  Patient;Family member/caregiver   Family Member Consulted husband: Madison Maxwell (sp?)        Problem List Patient Active Problem List   Diagnosis Date Noted  . Left hand weakness   . Ataxia   . CVA (cerebral infarction) 11/09/2015  . Memory deficit     Arieanna Pressey L 01/17/2016, 12:50 PM  [facility] [address] [place], [place], [address] Phone: [phone]   Fax:  [phone]  Name: Madison Maxwell MRN: [id number] Date of Birth: 1920-11-26    Zerita Boers, PT,DPT 01/17/2016 12:50 PM Phone: [phone] Fax: [phone]

## 2016-01-22 ENCOUNTER — Ambulatory Visit: Payer: Medicare Other

## 2016-01-22 DIAGNOSIS — R269 Unspecified abnormalities of gait and mobility: Secondary | ICD-10-CM | POA: Diagnosis not present

## 2016-01-22 DIAGNOSIS — R278 Other lack of coordination: Secondary | ICD-10-CM

## 2016-01-22 DIAGNOSIS — R29898 Other symptoms and signs involving the musculoskeletal system: Secondary | ICD-10-CM

## 2016-01-22 DIAGNOSIS — R2681 Unsteadiness on feet: Secondary | ICD-10-CM

## 2016-01-22 NOTE — Therapy (Signed)
[facility] [address] [place], [place], [address] Phone: [phone]   Fax:  [phone]  Physical Therapy Treatment  Patient Details  Name: Madison Maxwell MRN: [id number] Date of Birth: 09-29-1920 Referring Provider: Dr. Joylene Draft  Encounter Date: 01/22/2016      PT End of Session - 01/22/16 1622    Visit Number 7   Number of Visits 9   Date for PT Re-Evaluation 01/24/16   Authorization Type G-code every 10th visit.   PT Start Time [phone]   PT Stop Time 1012   PT Time Calculation (min) 41 min   Equipment Utilized During Treatment Gait belt   Activity Tolerance Patient tolerated treatment well   Behavior During Therapy WFL for tasks assessed/performed      Past Medical History  Diagnosis Date  . Hyperlipidemia   . Memory deficit   . Allergic rhinitis   . Duodenal ulcer   . External hemorrhoid   . Anxiety   . SUI (stress urinary incontinence), female   . Osteoarthritis   . Osteoporosis   . Vitamin D deficiency     Past Surgical History  Procedure Laterality Date  . Vaginal hysterectomy    . Appendectomy      There were no vitals filed for this visit.  Visit Diagnosis:  Abnormality of gait  Unsteadiness  Coordination impairment  Weakness of both lower extremities      Subjective Assessment - 01/22/16 0934    Subjective Pt reported she fell outside but reported no injuries "I got myself back up, it wasn't a big deal".   Patient is accompained by: Family member  husband   Pertinent History Osteoporosis, SUI, memory loss   Patient Stated Goals Get back to "normal", so I can get back to my yard work.    Currently in Pain? No/denies                         [facility] Adult PT Treatment/Exercise - 01/22/16 1006    Ambulation/Gait   Ambulation/Gait Yes   Ambulation/Gait Assistance 5: Supervision;6: Modified independent (Device/Increase time)   Ambulation/Gait Assistance Details  Supervision without AD and MOD I with SPC over even terrain.   Ambulation Distance (Feet) 200 Feet   Assistive device Straight cane;None   Gait Pattern Step-through pattern;Decreased stride length;Decreased weight shift to left;Narrow base of support;Decreased trunk rotation   Ambulation Surface Level;Indoor   Gait velocity 1.73f/sec with SPC and 2.385fsec. without SPC             Balance Exercises - 01/22/16 0936    OTAGO PROGRAM   Head Movements Standing;5 reps  cues to keep trunk still   Neck Movements Standing;5 reps   Back Extension Standing;5 reps  cues for technique   Trunk Movements Standing;5 reps  cues to improve trunk rotation   Ankle Movements Sitting;10 reps   Knee Extensor 10 reps   Knee Flexor 10 reps   Hip ABductor 10 reps   Ankle Plantorflexors 20 reps, support   Ankle Dorsiflexors 20 reps, support   Knee Bends 10 reps, support   Backwards Walking Support   Walking and Turning Around --  n/a   Sideways Walking Assistive device  holding counter   Tandem Stance 10 seconds, support   Tandem Walk Support   One Leg Stand 10 seconds, support   Heel Walking Support   Toe Walk Support  n/a   Heel Toe Walking Backward --  N/A   Sit to Stand 10 reps, no support   Stair Walking n/a   Overall OTAGO Comments Pt required cues and demonstration for proper techinque during approx. half of activities.            PT Education - 01/22/16 1621    Education provided Yes   Education Details PT discussed d/c next session depending on goal progress. PT discussed the importance of performing HEP,even if exercises are difficult, as pt reported she does not like to perform the ones that challenge her. PT also reiterated the importance of using AD when amb. outside, as she reported she fell over the weekend.    Person(s) Educated Patient;Spouse   Methods Explanation   Comprehension Verbalized understanding          PT Short Term Goals - 12/25/15 1721    PT SHORT  TERM GOAL #1   Title same as LTGs           PT Long Term Goals - 01/22/16 1624    PT LONG TERM GOAL #1   Title Pt will be IND in performing HEP with assistance from spouse to improve deficits listed above. Target date: 01/22/16   Status Partially Met   PT LONG TERM GOAL #2   Title Pt will improve BERG score to >/=46/56 to decr. falls risk. Target date: 01/22/16   Status On-going   PT LONG TERM GOAL #3   Title Pt will improve TUG with LRAD to </=13.5sec. to decr. falls risk. Target date: 01/22/16   Status On-going   PT LONG TERM GOAL #4   Title Pt will improve gait speed to >/=2.24f/sec. with LRAD to safely amb. in the community. Target date: 01/22/16   Status On-going   PT LONG TERM GOAL #5   Title Pt will verbalize understanding of CVA risk factors/symptoms to decr. risk of another CVA. Target date: 01/22/16   Status On-going   PT LONG TERM GOAL #6   Title Pt will amb. 600' over even/uneven terrain at MOD I level with LRAD to improve functional mobility and allow pt to work out in yard. Target date: 01/22/16   Status On-going               Plan - 01/22/16 1623    Clinical Impression Statement Pt partially met LTG 1, as she required cues and demonstration for proper technique during OTAGO HEP. Pt continues to require reminders to use AD (SPC or rollator-preferred) when amb. outdoors for safety. Continue with POC.    Pt will benefit from skilled therapeutic intervention in order to improve on the following deficits Abnormal gait;Impaired sensation;Postural dysfunction;Impaired flexibility;Decreased safety awareness;Decreased coordination;Decreased balance;Decreased mobility;Decreased strength;Decreased knowledge of use of DME   Rehab Potential Good   Clinical Impairments Affecting Rehab Potential Osteoporosis, memory deficits, and decrease want to use AD   PT Frequency 2x / week   PT Duration 4 weeks   PT Treatment/Interventions ADLs/Self Care Home Management;Biofeedback;Balance  training;Manual techniques;Therapeutic exercise;Therapeutic activities;Orthotic Fit/Training;Functional mobility training;Stair training;Gait training;DME Instruction;Patient/family education;Neuromuscular re-education   PT Next Visit Plan Finish assessing LTGs and d/c.   PT Home Exercise Plan OTAGO HEP   Consulted and Agree with Plan of Care Patient;Family member/caregiver   Family Member Consulted husband: [name] (sp?)        Problem List Patient Active Problem List   Diagnosis Date Noted  . Left hand weakness   . Ataxia   . CVA (cerebral infarction) 11/09/2015  . Memory deficit  Tawan Degroote L 01/22/2016, 4:25 PM  [facility] [address] [place], [place], [address] Phone: [phone]   Fax:  [phone]  Name: ROSSY VIRAG MRN: [id number] Date of Birth: [birth year]    Geoffry Paradise, PT,DPT 01/22/2016 4:25 PM Phone: [phone] Fax: [phone]

## 2016-01-24 ENCOUNTER — Ambulatory Visit: Payer: Medicare Other

## 2016-01-24 DIAGNOSIS — R269 Unspecified abnormalities of gait and mobility: Secondary | ICD-10-CM

## 2016-01-24 DIAGNOSIS — R2681 Unsteadiness on feet: Secondary | ICD-10-CM

## 2016-01-24 NOTE — Therapy (Signed)
[facility] [address] [place], [place], [address] Phone: [phone]   Fax:  [phone]  Physical Therapy Treatment  Patient Details  Name: Madison Maxwell MRN: [id number] Date of Birth: 1920/04/20 Referring Provider: Dr. Joylene Draft  Encounter Date: 01/24/2016      PT End of Session - 01/24/16 1006    Visit Number 8   Number of Visits 9   Date for PT Re-Evaluation 01/24/16   Authorization Type G-code every 10th visit.   PT Start Time [phone]   PT Stop Time 1002   PT Time Calculation (min) 31 min   Equipment Utilized During Treatment Gait belt   Activity Tolerance Patient tolerated treatment well   Behavior During Therapy WFL for tasks assessed/performed      Past Medical History  Diagnosis Date  . Hyperlipidemia   . Memory deficit   . Allergic rhinitis   . Duodenal ulcer   . External hemorrhoid   . Anxiety   . SUI (stress urinary incontinence), female   . Osteoarthritis   . Osteoporosis   . Vitamin D deficiency     Past Surgical History  Procedure Laterality Date  . Vaginal hysterectomy    . Appendectomy      There were no vitals filed for this visit.  Visit Diagnosis:  Abnormality of gait  Unsteadiness      Subjective Assessment - 01/24/16 0934    Subjective Pt denied falls or changes since last visit.    Patient is accompained by: Family member   Pertinent History Osteoporosis, SUI, memory loss   Patient Stated Goals Get back to "normal", so I can get back to my yard work.    Currently in Pain? No/denies                         [facility] Adult PT Treatment/Exercise - 01/24/16 0936    Ambulation/Gait   Ambulation/Gait Yes   Ambulation/Gait Assistance 6: Modified independent (Device/Increase time)   Ambulation/Gait Assistance Details No overt LOB and pt demonstrated proper sequencing with SPC.   Ambulation Distance (Feet) 800 Feet   Assistive device Straight cane;None   Gait  Pattern Step-through pattern;Decreased weight shift to left;Narrow base of support;Decreased trunk rotation   Ambulation Surface Level;Unlevel;Indoor;Outdoor;Paved;Grass   Gait velocity 2.80f/sec. with SPC and 3.676fsec.   Standardized Balance Assessment   Standardized Balance Assessment Berg Balance Test;Timed Up and Go Test   Berg Balance Test   Sit to Stand Able to stand without using hands and stabilize independently   Standing Unsupported Able to stand safely 2 minutes   Sitting with Back Unsupported but Feet Supported on Floor or Stool Able to sit safely and securely 2 minutes   Stand to Sit Sits safely with minimal use of hands   Transfers Able to transfer safely, minor use of hands   Standing Unsupported with Eyes Closed Able to stand 10 seconds safely   Standing Ubsupported with Feet Together Able to place feet together independently and stand 1 minute safely   From Standing, Reach Forward with Outstretched Arm Can reach confidently >25 cm (10")   From Standing Position, Pick up Object from Floor Able to pick up shoe safely and easily   From Standing Position, Turn to Look Behind Over each Shoulder Looks behind from both sides and weight shifts well   Turn 360 Degrees Able to turn 360 degrees safely but slowly   Standing Unsupported, Alternately Place Feet on Step/Stool  Able to complete 4 steps without aid or supervision  6 steps then with supervision   Standing Unsupported, One Foot in [place] to plae foot ahead of the other independently and hold 30 seconds   Standing on One Leg Tries to lift leg/unable to hold 3 seconds but remains standing independently   Total Score 48   Timed Up and Go Test   TUG Normal TUG   Normal TUG (seconds) 12.1  without SPC and 18.3sec. with [facility]                PT Education - 02/19/16 1006    Education provided Yes   Education Details PT discussed goal progress and outcome measure results. PT reiterated the importance of performing HEP  after PT d/c. Pt required minimal cues to name CVA risk factors.   Person(s) Educated Patient;Spouse   Methods Explanation   Comprehension Verbalized understanding          PT Short Term Goals - 12/25/15 1721    PT SHORT TERM GOAL #1   Title same as LTGs           PT Long Term Goals - 19-Feb-2016 1007    PT LONG TERM GOAL #1   Title Pt will be IND in performing HEP with assistance from spouse to improve deficits listed above. Target date: 01/22/16   Status Partially Met   PT LONG TERM GOAL #2   Title Pt will improve BERG score to >/=46/56 to decr. falls risk. Target date: 01/22/16   Status Achieved   PT LONG TERM GOAL #3   Title Pt will improve TUG with LRAD to </=13.5sec. to decr. falls risk. Target date: 01/22/16   Status Achieved   PT LONG TERM GOAL #4   Title Pt will improve gait speed to >/=2.72f/sec. with LRAD to safely amb. in the community. Target date: 01/22/16   Status Achieved   PT LONG TERM GOAL #5   Title Pt will verbalize understanding of CVA risk factors/symptoms to decr. risk of another CVA. Target date: 01/22/16   Status Partially Met   PT LONG TERM GOAL #6   Title Pt will amb. 600' over even/uneven terrain at MOD I level with LRAD to improve functional mobility and allow pt to work out in yard. Target date: 01/22/16   Status Achieved               Plan - 0Mar 21, 20171007    Clinical Impression Statement Pt met LTGs 2, 3, 4, and 6. Pt partially met LTGs 1 and 5. Please see d/c summary for details.           G-Codes - [id number]    Functional Assessment Tool Used BERG: 48/56; gait speed no AD: 3.613fsec; TUG no AD: 12.1sec.   Functional Limitation Mobility: Walking and moving around   Mobility: Walking and Moving Around Goal Status ([id number] least 1 percent but less than 20 percent impaired, limited or restricted   Mobility: Walking and Moving Around Discharge Status ([id number] least 1 percent but less than 20 percent impaired, limited or restricted       Problem List Patient Active Problem List   Diagnosis Date Noted  . Left hand weakness   . Ataxia   . CVA (cerebral infarction) 11/09/2015  . Memory deficit     , L 01/04/20/1710:08 AM  [place]one: [phone] Fax:  [phone]  Name: Madison Maxwell MRN: [id number] Date of Birth: [birth year]  PHYSICAL THERAPY DISCHARGE SUMMARY  Visits from Start of Care: 8  Current functional level related to goals / functional outcomes:     PT Long Term Goals - 01/24/16 1007    PT LONG TERM GOAL #1   Title Pt will be IND in performing HEP with assistance from spouse to improve deficits listed above. Target date: 01/22/16   Status Partially Met   PT LONG TERM GOAL #2   Title Pt will improve BERG score to >/=46/56 to decr. falls risk. Target date: 01/22/16   Status Achieved   PT LONG TERM GOAL #3   Title Pt will improve TUG with LRAD to </=13.5sec. to decr. falls risk. Target date: 01/22/16   Status Achieved   PT LONG TERM GOAL #4   Title Pt will improve gait speed to >/=2.35f/sec. with LRAD to safely amb. in the community. Target date: 01/22/16   Status Achieved   PT LONG TERM GOAL #5   Title Pt will verbalize understanding of CVA risk factors/symptoms to decr. risk of another CVA. Target date: 01/22/16   Status Partially Met   PT LONG TERM GOAL #6   Title Pt will amb. 600' over even/uneven terrain at MOD I level with LRAD to improve functional mobility and allow pt to work out in yard. Target date: 01/22/16   Status Achieved        Remaining deficits: Pt requires SPC when amb. Over uneven terrain,  PT reiterated this to pt at end of session.   Education / Equipment: HEP  Plan: Patient agrees to discharge.  Patient goals were met. Patient is being discharged due to meeting the stated rehab goals.  ?????       JGeoffry Paradise PT,DPT 01/24/2016  10:09 AM Phone: [phone]: [phone]

## 2016-03-28 ENCOUNTER — Other Ambulatory Visit (HOSPITAL_COMMUNITY): Payer: Self-pay | Admitting: *Deleted

## 2016-03-31 ENCOUNTER — Ambulatory Visit (HOSPITAL_COMMUNITY)
Admission: RE | Admit: 2016-03-31 | Discharge: 2016-03-31 | Disposition: A | Discharge status: Home / Self Care | Payer: Medicare Other | Source: Ambulatory Visit | Attending: Internal Medicine | Admitting: Internal Medicine

## 2016-03-31 DIAGNOSIS — M810 Age-related osteoporosis without current pathological fracture: Secondary | ICD-10-CM | POA: Insufficient documentation

## 2016-03-31 MED ORDER — DENOSUMAB 60 MG/ML ~~LOC~~ SOLN
60.0000 mg | Freq: Once | SUBCUTANEOUS | Status: AC
  Administered 2016-03-31: 60 mg via SUBCUTANEOUS
  Filled 2016-03-31: qty 1

## 2016-06-13 ENCOUNTER — Ambulatory Visit: Payer: Medicare Other | Attending: Internal Medicine

## 2016-06-13 DIAGNOSIS — M6281 Muscle weakness (generalized): Secondary | ICD-10-CM | POA: Diagnosis present

## 2016-06-13 DIAGNOSIS — R269 Unspecified abnormalities of gait and mobility: Secondary | ICD-10-CM | POA: Diagnosis present

## 2016-06-13 DIAGNOSIS — R2689 Other abnormalities of gait and mobility: Secondary | ICD-10-CM | POA: Diagnosis present

## 2016-06-13 DIAGNOSIS — R2681 Unsteadiness on feet: Secondary | ICD-10-CM | POA: Diagnosis present

## 2016-06-13 DIAGNOSIS — R278 Other lack of coordination: Secondary | ICD-10-CM | POA: Diagnosis present

## 2016-06-13 DIAGNOSIS — R29898 Other symptoms and signs involving the musculoskeletal system: Secondary | ICD-10-CM | POA: Diagnosis present

## 2016-06-14 NOTE — Therapy (Signed)
[facility] [address] [place], [place], [address] Phone: [phone]   Fax:  [phone]  Physical Therapy Evaluation  Patient Details  Name: Madison Maxwell MRN: [id number] Date of Birth: 04-06-1920 Referring Provider: Dr. Waynard Edwards  Encounter Date: 06/13/2016      PT End of Session - 06/14/16 1034    Visit Number 1   Number of Visits 9   Date for PT Re-Evaluation 07/13/16   Authorization Type G-CODE AND PROGRESS NOTE EVERY 10TH VISIT.   PT Start Time [phone]   PT Stop Time 0930   PT Time Calculation (min) 43 min   Equipment Utilized During Treatment Gait belt   Activity Tolerance Patient tolerated treatment well   Behavior During Therapy WFL for tasks assessed/performed      Past Medical History  Diagnosis Date  . Hyperlipidemia   . Memory deficit   . Allergic rhinitis   . Duodenal ulcer   . External hemorrhoid   . Anxiety   . SUI (stress urinary incontinence), female   . Osteoarthritis   . Osteoporosis   . Vitamin D deficiency     Past Surgical History  Procedure Laterality Date  . Vaginal hysterectomy    . Appendectomy      There were no vitals filed for this visit.       Subjective Assessment - 06/13/16 0854    Subjective Pt's husband assisted pt with providing hx, due to memory deficits. Pt reported she has been experiencing weakness in her legs, and requires a cane and RW. Pt reported she stopped performing exercises but still walks to mailbox and performs ADLs. Pt denied falls since last PT POC. Pt reports she has been having a sleeping problem and still wakes up early even when taking the Ambien. Pt reported LBP/L hip pain upon waking up in the morning, but not severe. The pain is chronic and MD is aware.    Patient is accompained by: Family member  husband: Madison Maxwell   Pertinent History Hx of 11/2015 of R CVA, memory issues, sleeping issues, osteoporosis, SUI, anxiety, hyperlipidemia, mild OA, hx  of cataracts, HOH, DJD of Lx spine   Patient Stated Goals To get my legs working better.    Currently in Pain? No/denies  Pt reports no pain during the day but reports slight  LBP and L hip pain at night            [facility] PT Assessment - 06/13/16 0901    Assessment   Medical Diagnosis B LE weakness   Referring Provider Dr. Waynard Edwards   Onset Date/Surgical Date 04/13/16   Prior Therapy [facility] neuro s/p 11/2015 CVA   Precautions   Precautions Fall   Precaution Comments based on BERG score and TUG time.   Restrictions   Weight Bearing Restrictions No   Balance Screen   Has the patient fallen in the past 6 months No   Has the patient had a decrease in activity level because of a fear of falling?  Yes   Is the patient reluctant to leave their home because of a fear of falling?  No   Home Tourist information centre manager residence   Living Arrangements Spouse/significant other   Available Help at Discharge Family   Type of Home House   Home Access Stairs to enter   Entrance Stairs-Number of Steps 1   Entrance Stairs-Rails None   Home Layout One level   Home Equipment Walker - 2 wheels;Cane -  single point;Shower seat - built in;Grab bars - tub/shower;Toilet riser;Grab bars - toilet   Prior Function   Level of Independence Independent   Vocation Retired   Pension scheme manager, Financial risk analyst, housework, church, crossword puzzles    Cognition   Overall Cognitive Status History of cognitive impairments - at baseline   Memory Impaired  husband assist with hx    Memory Impairment Retrieval deficit   Observation/Other Assessments   Focus on Therapeutic Outcomes (FOTO)  ABC: 18.1%-scores closer to 100% indicate pt has more confidence in balance   Sensation   Light Touch Impaired by gross assessment   Additional Comments Pt reports decr. L hand sensation which never completely returned after CVA.   Coordination   Gross Motor Movements are Fluid and Coordinated Yes   Fine Motor Movements are  Fluid and Coordinated Yes   Posture/Postural Control   Posture/Postural Control Postural limitations   Postural Limitations Forward head   ROM / Strength   AROM / PROM / Strength AROM;Strength   AROM   Overall AROM  Deficits   Overall AROM Comments B UE/LE WFL AROM.   Strength   Overall Strength Deficits   Overall Strength Comments B hip flex: 3+/5, knee ext/flex: 4/5, ankle DF: 3+/5, hip abd/add in seated position: 4/5. B hip ext. weakness suspected 2/2 gait deviations, but not formally assessed.   Transfers   Transfers Sit to Stand;Stand to Sit   Sit to Stand 5: Supervision;Without upper extremity assist;From chair/3-in-1   Stand to Sit 5: Supervision;Without upper extremity assist;To chair/3-in-1   Ambulation/Gait   Ambulation/Gait Yes   Ambulation/Gait Assistance 4: Min guard;5: Supervision   Ambulation/Gait Assistance Details Min guard during turns, no overt LOB but pt decr. speed during turns.   Ambulation Distance (Feet) 75 Feet   Assistive device Straight cane   Gait Pattern Step-through pattern;Decreased stride length;Decreased arm swing - left   Ambulation Surface Level;Indoor   Gait velocity 1.74ft/sec.  with [facility]   Standardized Balance Assessment   Standardized Balance Assessment Berg Balance Test;Timed Up and Go Test   Berg Balance Test   Sit to Stand Able to stand without using hands and stabilize independently   Standing Unsupported Able to stand safely 2 minutes   Sitting with Back Unsupported but Feet Supported on Floor or Stool Able to sit safely and securely 2 minutes   Stand to Sit Sits safely with minimal use of hands   Transfers Able to transfer safely, minor use of hands   Standing Unsupported with Eyes Closed Able to stand 10 seconds with supervision   Standing Ubsupported with Feet Together Able to place feet together independently and stand for 1 minute with supervision   From Standing, Reach Forward with Outstretched Arm Can reach forward >12 cm safely  (5")  6"   From Standing Position, Pick up Object from Floor Able to pick up shoe, needs supervision   From Standing Position, Turn to Look Behind Over each Shoulder Looks behind one side only/other side shows less weight shift   Turn 360 Degrees Needs close supervision or verbal cueing   Standing Unsupported, Alternately Place Feet on Step/Stool Able to complete 4 steps without aid or supervision   Standing Unsupported, One Foot in Front Able to take small step independently and hold 30 seconds   Standing on One Leg Tries to lift leg/unable to hold 3 seconds but remains standing independently   Total Score 41   Timed Up and Go Test   TUG Normal TUG  Normal TUG (seconds) 22.4  with [facility]                           PT Education - 06/14/16 1034    Education provided Yes   Education Details PT discussed outcome measures and PT frequency/duration.    Person(s) Educated Patient;Spouse   Methods Explanation   Comprehension Verbalized understanding          PT Short Term Goals - 06/14/16 1040    PT SHORT TERM GOAL #1   Title same as LTGs           PT Long Term Goals - 06/14/16 1040    PT LONG TERM GOAL #1   Title Pt will be IND in performing modified HEP with assistance from spouse to improve strength, balance and safety. Target date: 07/11/16   PT LONG TERM GOAL #2   Title Pt will improve BERG score to >/=46/56 to decr. falls risk. Target date: 07/11/16   Status New   PT LONG TERM GOAL #3   Title Pt will improve TUG with LRAD to </=13.5sec. to decrease falls risk. Target date: 07/11/16   Status New   PT LONG TERM GOAL #4   Title Pt will improve gait speed to >/=2.3962ft/sec. with LRAD to safely ambulate in the community. Target date: 07/11/16   Status New   PT LONG TERM GOAL #5   Title Pt will improve ABC score to >/=28.1% to improve confidence in balance and quality of life. Target date: 07/11/16   Status New   Additional Long Term Goals   Additional Long Term  Goals Yes   PT LONG TERM GOAL #6   Title Pt will amb. 500' over even/uneven terrain (including grassy terrain) at MOD I level with LRAD to improve functional mobility and allow pt to perform yard work safely. Target date: 07/11/16   Status New               Plan - 06/14/16 1035    Clinical Impression Statement Pt is a pleasant 82y/o female, familiar to OPPT neuro as she received PT at this clinic after 11/2015 R CVA. Pt presents to day for B LE weakness and physical deconditioning. Pt's PMH significant for the following: Hx of 11/2015 of R CVA, memory issues, sleeping issues, osteoporosis, SUI, anxiety, hyperlipidemia, mild OA, hx of cataracts, HOH, DJD of Lx spine. During examination the following deficits were noted: gait deviations, impaired balance, decr. endurance, decr. strength, postural dysfunction, and memory deficits. Pt's BERG score and TUG times indicate pt is at risk for falls. Pt's gait speed is slightly above the 1.478ft/sec. cut-off for falls, however, pt's gait speed indicates she is not able to safely amb. in the community.  This bout of PT will focus on modifying former HEP to ensure pt adheres to HEP after d/c from therapy, and to address balance and strength deficits during PT sessions to decr. falls risk.    Rehab Potential Good   Clinical Impairments Affecting Rehab Potential co-morbidities   PT Frequency 2x / week   PT Duration 4 weeks   PT Treatment/Interventions ADLs/Self Care Home Management;Biofeedback;Canalith Repostioning;Patient/family education;Orthotic Fit/Training;Cognitive remediation;Neuromuscular re-education;Balance training;Therapeutic exercise;Therapeutic activities;Functional mobility training;Stair training;Gait training;DME Instruction;Vestibular;Manual techniques   PT Next Visit Plan Review former HEP and modify as needed   Consulted and Agree with Plan of Care Patient;Family member/caregiver   Family Member Consulted husband-Madison Maxwell      Patient  will benefit from skilled  therapeutic intervention in order to improve the following deficits and impairments:  Abnormal gait, Decreased knowledge of use of DME, Decreased strength, Decreased mobility, Decreased balance, Impaired sensation, Decreased cognition, Decreased endurance, Pain (PT will not directly address pain, but will monitor closely)  Visit Diagnosis: Muscle weakness (generalized) - Plan: PT plan of care cert/re-cert  Other abnormalities of gait and mobility - Plan: PT plan of care cert/re-cert        G-Codes - 11-Jul-2016 1044    Functional Assessment Tool Used BERG: 41/56; TUG with SPC: 22.4sec.; gait speed with SPC: 1.69ft/sec.   Functional Limitation Mobility:  Walking and moving around   Mobility: Walking and Moving Around Current Status [id number]) CK (40-60%)   Mobility: Walking and Moving Around Goal Status (X9147) CI (1-20%)      Problem List Patient Active Problem List   Diagnosis Date Noted  . Left hand weakness   . Ataxia   . CVA (cerebral infarction) 11/09/2015  . Memory deficit     Ronneisha Jett L 07/11/16, 10:48 AM  [facility] [address] [place], [place], [address] Phone: [phone]   Fax:  [phone]  Name: Madison Maxwell MRN: [id number] Date of Birth: December 03, 1919   Zerita Boers, PT,DPT Jul 11, 2016 10:48 AM Phone: [phone] Fax: [phone]

## 2016-06-17 ENCOUNTER — Encounter: Payer: Self-pay | Admitting: Nurse Practitioner

## 2016-06-17 ENCOUNTER — Ambulatory Visit (INDEPENDENT_AMBULATORY_CARE_PROVIDER_SITE_OTHER): Payer: Medicare Other | Admitting: Nurse Practitioner

## 2016-06-17 VITALS — BP 124/71 | HR 78 | Ht 65.0 in | Wt 99.8 lb

## 2016-06-17 DIAGNOSIS — I639 Cerebral infarction, unspecified: Secondary | ICD-10-CM

## 2016-06-17 DIAGNOSIS — E785 Hyperlipidemia, unspecified: Secondary | ICD-10-CM | POA: Insufficient documentation

## 2016-06-17 NOTE — Progress Notes (Signed)
I have reviewed and agreed above plan. 

## 2016-06-17 NOTE — Patient Instructions (Signed)
Continue aspirin 325 for secondary stroke prevention Continue Lipitor for cholesterol and lipids to be followed by Dr. Darcus AustinPerrini LDL less than 100 recent 139 Hemoglobin A1c 5.7 Continue home exercise program after OP neurorehab  Use cane for safe ambulation and to prevent falls Follow up with us in 6 months

## 2016-06-17 NOTE — Progress Notes (Signed)
[facility]  PATIENT: Madison Maxwell DOB: 06-05-1920   REASON FOR VISIT: Follow-up for CVA, gait disorder, dyslipidemia HISTORY FROM: Patient, and husband, daughter  HISTORY OF PRESENT ILLNESS:UPDATE 7/18/17CM Madison Maxwell, 82 year old female returns for follow-up. She has history of stroke that occurred December 2016. She has not had further stroke or TIA symptoms since that time she is currently on aspirin 325 daily with minimal  bruising. She remains on Lipitor. Her labs are followed  by Dr. Waynard EdwardsPerini. Prior to her stroke she was on Exelon for mild memory loss. That appears to be stable. She is currently receiving outpatient physical therapy ordered by Dr. Waynard EdwardsPerini for generalized weakness. She ambulates with single-point cane, she denies any falls. She returns for reevaluation    HISTORY 12/20/2015 CM Madison Maxwell, 82 year old female was admitted 11/09/2015 after having memory deficits and difficulty using her left hand and gait instability. This has been going on for approximately 3 days and then she developed sudden onset of gait instability and trouble walking. She had weakness in her left hand and trouble doing coordinated tasks. No history of prior CVA or TIA. Was taking aspirin 0.81 at home. TPA not given due to outside TPA window. LDL noted to be elevated at 139 she was started on Lipitor. MRI of the head small acute white matter infarcts in the right frontal lobe. Chronic small vessel disease No associated hemorrhage or mass effect. Intracranial MRA negative. Carotid Doppler 1-39% plaquing. 2-D echo ejection fraction 60-65% no cardiac source of emboli identified. She was discharged home with home health physical therapy on 11/12/2015. Her therapy has now completed after about 6 weeks. She continues to do home exercise program. She has not had further stroke or TIA symptoms She returns for reevaluation   REVIEW OF SYSTEMS: Full 14 system review of systems performed and  notable only for those listed, all others are neg:  Constitutional: neg  Cardiovascular: neg Ear/Nose/Throat: neg  Skin: neg Eyes: neg Respiratory: neg Gastroitestinal: neg  Hematology/Lymphatic: neg  Endocrine: neg Musculoskeletal: Generalized weakness Allergy/Immunology: neg Neurological: neg Psychiatric: neg Sleep : Insomnia currently on Ambien   ALLERGIES: No Known Allergies  HOME MEDICATIONS: Outpatient Prescriptions Prior to Visit  Medication Sig Dispense Refill  . aspirin EC 325 MG EC tablet Take 1 tablet (325 mg total) by mouth daily. 30 tablet 0  . atorvastatin (LIPITOR) 20 MG tablet Take 1 tablet (20 mg total) by mouth daily at 6 PM. 30 tablet 0  . cholecalciferol (VITAMIN D) 1000 units tablet Take 1,000 Units by mouth daily.    Marland Kitchen. denosumab (PROLIA) 60 MG/ML SOLN injection Inject 60 mg into the skin every 6 (six) months. Administer in upper arm, thigh, or abdomen    . Multiple Vitamins-Minerals (MULTIVITAMINS THER. W/MINERALS) TABS Take 1 tablet by mouth daily.    . rivastigmine (EXELON) 9.5 mg/24hr Place 9.5 mg onto the skin daily.     . vitamin B-12 (CYANOCOBALAMIN) 1000 MCG tablet Take 1,000 mcg by mouth daily.    . vitamin C (ASCORBIC ACID) 500 MG tablet Take 500 mg by mouth daily.    Marland Kitchen. zolpidem (AMBIEN) 5 MG tablet Take 5 mg by mouth at bedtime.   0  . omega-3 acid ethyl esters (LOVAZA) 1 G capsule Take 2 g by mouth 2 (two) times daily.     No facility-administered medications prior to visit.    PAST MEDICAL HISTORY: Past Medical History  Diagnosis Date  . Hyperlipidemia   . Memory deficit   .  Allergic rhinitis   . Duodenal ulcer   . External hemorrhoid   . Anxiety   . SUI (stress urinary incontinence), female   . Osteoarthritis   . Osteoporosis   . Vitamin D deficiency     PAST SURGICAL HISTORY: Past Surgical History  Procedure Laterality Date  . Vaginal hysterectomy    . Appendectomy      FAMILY HISTORY: Family History  Problem Relation Age  of Onset  . Colon cancer Neg Hx   . Colon polyps Sister     SOCIAL HISTORY: Social History   Social History  . Marital Status: Married    Spouse Name: N/A  . Number of Children: 2  . Years of Education: N/A   Occupational History  . [employer]     Retired    Social History Main Topics  . Smoking status: Never Smoker   . Smokeless tobacco: Never Used  . Alcohol Use: No  . Drug Use: No  . Sexual Activity: Not on file   Other Topics Concern  . Not on file   Social History Narrative   Daily caffeine      PHYSICAL EXAM  Filed Vitals:   06/17/16 0918  BP: 124/71  Pulse: 78  Height:  (1.651 m)  Weight: 99 lb 12.8 oz (45.269 kg)   Body mass index is 16.61 kg/(m^2). Generalized: Well developed, frail appearing female in no acute distress  Head: normocephalic and atraumatic,. Oropharynx benign  Neck: Supple, no carotid bruits  Cardiac: Regular rate rhythm, no murmur  Musculoskeletal: No deformity   Neurological examination   Mentation: Alert oriented to time, place, history taking. Attention span and concentration appropriate.  Follows all commands speech and language fluent.   Cranial nerve II-XII: Pupils were equal round reactive to light extraocular movements were full, visual field were full on confrontational test. Facial sensation and strength were normal. hearing was intact to finger rubbing bilaterally. Uvula tongue midline. head turning and shoulder shrug were normal and symmetric.Tongue protrusion into cheek strength was normal. Motor: normal bulk and tone, full strength in the BUE, BLE, except 4/5 hip ext and flexion.Fine finger movements normal, no pronator drift.  Fair grip strength bil  Sensory: normal and symmetric to light touch, pinprick, decreased vibratory to ankles  Coordination: finger-nose-finger, heel-to-shin bilaterally, no dysmetria Reflexes: Depressed upper and lower plantar responses were flexor bilaterally. Gait and  Station: Rising up from seated position without assistance, wide based stance, mildly unsteady gait with cane  DIAGNOSTIC DATA (LABS, IMAGING, TESTING) - I reviewed patient records, labs, notes, testing and imaging myself where available.  Lab Results  Component Value Date   WBC 5.2 01/03/2016   HGB 12.6 01/03/2016   HCT 37.0 01/03/2016   MCV 95.0 01/03/2016   PLT 137* 01/03/2016      Component Value Date/Time   NA 142 01/03/2016 1646   K 4.1 01/03/2016 1646   CL 103 01/03/2016 1646   CO2 27 01/03/2016 1638   GLUCOSE 104* 01/03/2016 1646   BUN 23* 01/03/2016 1646   CREATININE 0.60 01/03/2016 1646   CALCIUM 9.6 01/03/2016 1638   PROT 6.3* 01/03/2016 1638   ALBUMIN 4.1 01/03/2016 1638   AST 27 01/03/2016 1638   ALT 25 01/03/2016 1638   ALKPHOS 48 01/03/2016 1638   BILITOT 0.6 01/03/2016 1638   GFRNONAA >60 01/03/2016 1638   GFRAA >60 01/03/2016 1638   Lab Results  Component Value Date   CHOL 193 11/10/2015   HDL 42 11/10/2015  LDLCALC 139* 11/10/2015   TRIG 59 11/10/2015   CHOLHDL 4.6 11/10/2015   Lab Results  Component Value Date   HGBA1C 5.7* 11/10/2015    ASSESSMENT AND PLAN 82 y.o. year old female has a past medical history of Hyperlipidemia; Memory deficit; Anxiety; SUI (stress urinary incontinence), Osteoarthritis; Osteoporosis; and Vitamin D deficiency.and right frontal lobe infarct secondary to small vessel disease. She has not had further stroke or TIA symptoms since discharge   PLAN: Continue aspirin 325 for secondary stroke prevention Continue Lipitor for cholesterol and lipids to be followed by Dr. Darcus Austin LDL less than 100 recent 139 Hemoglobin A1c 5.7 Continue home exercise program after OP neurorehab for generalized weakness Use cane for safe ambulation and to prevent falls Follow up with [place] in 6 months, if stable at that time will discharge Nilda Riggs, [facility], [facility], APRN  [facility] [address] [place], [place] [address] [phone]

## 2016-06-18 ENCOUNTER — Ambulatory Visit: Payer: Medicare Other | Admitting: Physical Therapy

## 2016-06-18 DIAGNOSIS — M6281 Muscle weakness (generalized): Secondary | ICD-10-CM

## 2016-06-18 DIAGNOSIS — R278 Other lack of coordination: Secondary | ICD-10-CM

## 2016-06-18 DIAGNOSIS — R29898 Other symptoms and signs involving the musculoskeletal system: Secondary | ICD-10-CM

## 2016-06-18 DIAGNOSIS — R2681 Unsteadiness on feet: Secondary | ICD-10-CM

## 2016-06-18 DIAGNOSIS — R269 Unspecified abnormalities of gait and mobility: Secondary | ICD-10-CM

## 2016-06-18 DIAGNOSIS — R2689 Other abnormalities of gait and mobility: Secondary | ICD-10-CM

## 2016-06-18 NOTE — Therapy (Signed)
[facility] [address] [place], [place], [address] Phone: [phone]   Fax:  [phone]  Physical Therapy Treatment  Patient Details  Name: [place] Maxwell MRN: [id number] Date of Birth: 02-27-1920 Referring Provider: Dr. Waynard Maxwell  Encounter Date: 06/18/2016      PT End of Session - 06/18/16 0929    Visit Number 2   Number of Visits 9   PT Start Time 0845   PT Stop Time 0930   PT Time Calculation (min) 45 min   Activity Tolerance Patient tolerated treatment well   Behavior During Therapy [facility] for tasks assessed/performed      Past Medical History  Diagnosis Date  . Hyperlipidemia   . Memory deficit   . Allergic rhinitis   . Duodenal ulcer   . External hemorrhoid   . Anxiety   . SUI (stress urinary incontinence), female   . Osteoarthritis   . Osteoporosis   . Vitamin D deficiency     Past Surgical History  Procedure Laterality Date  . Vaginal hysterectomy    . Appendectomy      There were no vitals filed for this visit.      Subjective Assessment - 06/18/16 0924    Subjective Pt's husband present today; he expressed concerns with compliance to HEP;Mrs. [place] stated she is very busy with housework etc ; however as we talked she admitted her balance is poor and her legs are weak.   Both stated they feel lightheaded when they first stand up but it resolves after standing for a minute   Patient is accompained by: Family member   Currently in Pain? No/denies          Therapeutic exercise  30 Second sit to stand  W/o UE 6 reps;    Practiced 2 x 5 before test Standing heel and toe raise  X 20  Single leg stance  X 5 sec bouts; with UE support  5 bouts FSST scored >20 seconds with UE support;  Utilized the test set up for step reflex test ; stepping over canes on floor ; mult direction; progressed this to doing stepping patterns for dancing; pt and husband have hx of ballroom dancing ; eventually  had the two of them doing a basic waltz pattern;   Educated the wife throughout the session on importance of continuing to do there ex to improve her balance and demonstrated to her several times that she is currently a high fall risk and this can be improved.                        PT Education - 06/18/16 0926    Education provided Yes   Education Details Educated on relelvance of ther ex to reduce fall risk with ulitmate goal to assure independence with IADL's;  Edcuated on how to incorporate ther ex into daily routine; i.e doing sit to stand during commercial breaks as they watch evening tv shows; also they have a history of ballroom dancing and I had them both do some waltz steps together to show them how they could practice step reflex ex's    Person(s) Educated Patient;Spouse   Methods Explanation   Comprehension Returned demonstration;Verbalized understanding          PT Short Term Goals - 06/14/16 1040    PT SHORT TERM GOAL #1   Title same as LTGs           PT Long  Term Goals - 06/14/16 1040    PT LONG TERM GOAL #1   Title Pt will be IND in performing modified HEP with assistance from spouse to improve strength, balance and safety. Target date: 07/11/16   PT LONG TERM GOAL #2   Title Pt will improve BERG score to >/=46/56 to decr. falls risk. Target date: 07/11/16   Status New   PT LONG TERM GOAL #3   Title Pt will improve TUG with LRAD to </=13.5sec. to decrease falls risk. Target date: 07/11/16   Status New   PT LONG TERM GOAL #4   Title Pt will improve gait speed to >/=2.37ft/sec. with LRAD to safely ambulate in the community. Target date: 07/11/16   Status New   PT LONG TERM GOAL #5   Title Pt will improve ABC score to >/=28.1% to improve confidence in balance and quality of life. Target date: 07/11/16   Status New   Additional Long Term Goals   Additional Long Term Goals Yes   PT LONG TERM GOAL #6   Title Pt will amb. 500' over even/uneven terrain  (including grassy terrain) at MOD I level with LRAD to improve functional mobility and allow pt to perform yard work safely. Target date: 07/11/16   Status New               Plan - 06/18/16 0929    Clinical Impression Statement Pt and husband are a beautiful couple; she did not see connectoin /importance of doing HEP and therex and stated she didn't know why she was here; husband is supportive but looking for help in gettig her consistent with ther ex;  she demonstated a poor step reflex and  weak functional strength;  30 second sit to stand / w/o UE  6 reps; and FSST requriied mod assistance to complete   PT Next Visit Plan see how HEP is going ; talk to them about dancing and safety    Consulted and Agree with Plan of Care Patient;Family member/caregiver   Family Member Consulted husband       Patient will benefit from skilled therapeutic intervention in order to improve the following deficits and impairments:     Visit Diagnosis: Muscle weakness (generalized)  Other abnormalities of gait and mobility  Unsteadiness  Abnormality of gait  Coordination impairment  Weakness of both lower extremities     Problem List Patient Active Problem List   Diagnosis Date Noted  . Dyslipidemia 06/17/2016  . Left hand weakness   . Ataxia   . CVA (cerebral infarction) 11/09/2015  . Memory deficit     [place] PT DPT  06/18/2016, 9:34 AM  [facility] [address] [place], [place], [address] Phone: [phone]   Fax:  [phone]  Name: [place] MRN: [id number] Date of Birth: Aug 10, 1920

## 2016-06-20 ENCOUNTER — Ambulatory Visit: Payer: Medicare Other

## 2016-06-25 ENCOUNTER — Ambulatory Visit: Payer: PPO | Admitting: Physical Therapy

## 2016-06-27 ENCOUNTER — Ambulatory Visit: Payer: Medicare Other

## 2016-06-27 DIAGNOSIS — M6281 Muscle weakness (generalized): Secondary | ICD-10-CM | POA: Diagnosis not present

## 2016-06-27 DIAGNOSIS — R2689 Other abnormalities of gait and mobility: Secondary | ICD-10-CM

## 2016-06-27 NOTE — Therapy (Signed)
[facility] [address] [place], [place], [address] Phone: [phone]   Fax:  [phone]  Physical Therapy Treatment  Patient Details  Name: [place] MRN: [id number] Date of Birth: 1920-08-01 Referring Provider: Dr. Waynard Edwards  Encounter Date: 06/27/2016      PT End of Session - 06/27/16 1444    Visit Number 3   Number of Visits 9   Date for PT Re-Evaluation 07/13/16   Authorization Type G-CODE AND PROGRESS NOTE EVERY 10TH VISIT.   PT Start Time 1402   PT Stop Time 1440   PT Time Calculation (min) 38 min   Equipment Utilized During Treatment Gait belt   Activity Tolerance Patient tolerated treatment well   Behavior During Therapy WFL for tasks assessed/performed      Past Medical History:  Diagnosis Date  . Allergic rhinitis   . Anxiety   . Duodenal ulcer   . External hemorrhoid   . Hyperlipidemia   . Memory deficit   . Osteoarthritis   . Osteoporosis   . SUI (stress urinary incontinence), female   . Vitamin D deficiency     Past Surgical History:  Procedure Laterality Date  . APPENDECTOMY    . VAGINAL HYSTERECTOMY      There were no vitals filed for this visit.      Subjective Assessment - 06/27/16 1404    Subjective Pt reported she fell while opening a tall cabinet and rolled R foot. Pt denied hitting head but stated R foot still hurts. Pt's husband stated x-ray was negative for fx's.  Pt states R ankle hurts a little when walking on it but feels like its getting better.   Patient is accompained by: Family member   Pertinent History Hx of 11/2015 of R CVA, memory issues, sleeping issues, osteoporosis, SUI, anxiety, hyperlipidemia, mild OA, hx of cataracts, HOH, DJD of Lx spine   Patient Stated Goals To get my legs working better.    Currently in Pain? No/denies                         [facility] Adult PT Treatment/Exercise - 06/27/16 1409      Ambulation/Gait   Ambulation/Gait Yes   Ambulation/Gait Assistance 5: Supervision;4: Min guard   Ambulation/Gait Assistance Details Min guard over grassy terrain, otherwise, S. Cues to improve stride length and upright posture.   Ambulation Distance (Feet) 500 Feet   Assistive device Straight cane   Gait Pattern Step-through pattern;Decreased stride length;Decreased arm swing - left   Ambulation Surface Level;Unlevel;Indoor;Outdoor;Paved;Grass;Gravel     High Level Balance   High Level Balance Activities Side stepping;Braiding;Backward walking;Head turns;Tandem walking;Marching forwards   High Level Balance Comments In // bars with 0-2 UE support and min guard to S for safety: 4x7'/activity listed above, with cues for improved eccentric control, lateral wt. shifting, and stride length. Pt also performed ant. stepping over 1" beam to improve heel strike x10 reps with 1 UE support x10 with 2 UE support and x10 without UE support.                PT Education - 06/27/16 1444    Education provided Yes   Education Details PT educated pt on the importance of performing HEP and to bring HEP next session so PT can modify and add dance steps. PT educated pt on informing MD if R ankle pain/swelling worsens. Pt states she paints (does not climb ladder) and rakes yard,  PT encourage pt to use AD during activities.    Person(s) Educated Patient;Spouse   Methods Explanation   Comprehension Verbalized understanding          PT Short Term Goals - 06/14/16 1040      PT SHORT TERM GOAL #1   Title same as LTGs           PT Long Term Goals - 06/14/16 1040      PT LONG TERM GOAL #1   Title Pt will be IND in performing modified HEP with assistance from spouse to improve strength, balance and safety. Target date: 07/11/16     PT LONG TERM GOAL #2   Title Pt will improve BERG score to >/=46/56 to decr. falls risk. Target date: 07/11/16   Status New     PT LONG TERM GOAL #3   Title Pt will improve TUG with  LRAD to </=13.5sec. to decrease falls risk. Target date: 07/11/16   Status New     PT LONG TERM GOAL #4   Title Pt will improve gait speed to >/=2.62ft/sec. with LRAD to safely ambulate in the community. Target date: 07/11/16   Status New     PT LONG TERM GOAL #5   Title Pt will improve ABC score to >/=28.1% to improve confidence in balance and quality of life. Target date: 07/11/16   Status New     Additional Long Term Goals   Additional Long Term Goals Yes     PT LONG TERM GOAL #6   Title Pt will amb. 500' over even/uneven terrain (including grassy terrain) at MOD I level with LRAD to improve functional mobility and allow pt to perform yard work safely. Target date: 07/11/16   Status New               Plan - 06/27/16 1445    Clinical Impression Statement Pt demonstrated improved ability to perform balance tasks if presented as rhythmic dance steps, therefore, PT will modify HEP to ensure incr. compliance. Pt noted to experience incr. postural sway and decr. step length over uneven surfaces during gait and would continue to benefit from skilled PT to improve safety and IND in functional mobility.  PT assessed pt's R ankle and pt denied pain upon palpation or during session, no edema/bruising noted.   Rehab Potential Good   Clinical Impairments Affecting Rehab Potential co-morbidities   PT Frequency 2x / week   PT Duration 4 weeks   PT Next Visit Plan Modify HEP to incorporate dance wt. shifting steps   Consulted and Agree with Plan of Care Patient;Family member/caregiver   Family Member Consulted husband       Patient will benefit from skilled therapeutic intervention in order to improve the following deficits and impairments:  Abnormal gait, Decreased knowledge of use of DME, Decreased strength, Decreased mobility, Decreased balance, Impaired sensation, Decreased cognition, Decreased endurance, Pain  Visit Diagnosis: Other abnormalities of gait and mobility     Problem  List Patient Active Problem List   Diagnosis Date Noted  . Dyslipidemia 06/17/2016  . Left hand weakness   . Ataxia   . CVA (cerebral infarction) 11/09/2015  . Memory deficit     Jerral Mccauley L 06/27/2016, 2:48 PM  [facility] [address] [place], [place], [address] Phone: [phone]   Fax:  [phone]  Name: MEDIE SCHLENKER MRN: [id number] Date of Birth: 12-26-1919   Zerita Boers, PT,DPT 06/27/16 2:48 PM Phone: [phone] Fax: [phone]

## 2016-07-01 ENCOUNTER — Ambulatory Visit: Payer: Medicare Other | Attending: Internal Medicine | Admitting: Physical Therapy

## 2016-07-01 DIAGNOSIS — R2681 Unsteadiness on feet: Secondary | ICD-10-CM | POA: Insufficient documentation

## 2016-07-01 DIAGNOSIS — M6281 Muscle weakness (generalized): Secondary | ICD-10-CM | POA: Diagnosis present

## 2016-07-01 DIAGNOSIS — R2689 Other abnormalities of gait and mobility: Secondary | ICD-10-CM

## 2016-07-01 NOTE — Therapy (Signed)
[facility] [address] [place], [place], [address] Phone: [phone]   Fax:  [phone]  Physical Therapy Treatment  Patient Details  Name: [place] MRN: [id number] Date of Birth: 1920-11-03 Referring Provider: Dr. Waynard Edwards  Encounter Date: 07/01/2016      PT End of Session - 07/01/16 1545    Visit Number 4   Number of Visits 9   Date for PT Re-Evaluation 07/13/16   Authorization Type G-CODE AND PROGRESS NOTE EVERY 10TH VISIT.   PT Start Time 1020   PT Stop Time 1103   PT Time Calculation (min) 43 min   Equipment Utilized During Treatment Gait belt   Activity Tolerance Patient tolerated treatment well   Behavior During Therapy WFL for tasks assessed/performed      Past Medical History:  Diagnosis Date  . Allergic rhinitis   . Anxiety   . Duodenal ulcer   . External hemorrhoid   . Hyperlipidemia   . Memory deficit   . Osteoarthritis   . Osteoporosis   . SUI (stress urinary incontinence), female   . Vitamin D deficiency     Past Surgical History:  Procedure Laterality Date  . APPENDECTOMY    . VAGINAL HYSTERECTOMY      There were no vitals filed for this visit.      Subjective Assessment - 07/01/16 1542    Subjective Pt denies falls or changes since last visit.  Has still been working on painting rooms in a rental unit.  States she does not get on the ladder to paint.   Patient is accompained by: Family member   Pertinent History Hx of 11/2015 of R CVA, memory issues, sleeping issues, osteoporosis, SUI, anxiety, hyperlipidemia, mild OA, hx of cataracts, HOH, DJD of Lx spine   Patient Stated Goals To get my legs working better.    Currently in Pain? No/denies      Cone taps and double taps and tipping/uprighting with min assist to balance 4 square step test x 9 times working on stepping strategies.  Needs min assist to balance. Standing in parallel bars for forward walking without UE  support,backward walking, side stepping, braiding. SLS x 10 sec x 2.  Tandem stance x 10 sec x 2. Forward and backward step weight shifts-pt with difficulty following and carrying over instructions for full weight shift (vs just sticking leg out) so did not provide as HEP addition.        PT Education - 07/01/16 1544    Education provided Yes   Education Details Importance of exercises at home and resuming OTAGO provided during last bout with therapy.     Person(s) Educated Patient;Spouse   Methods Explanation   Comprehension Verbalized understanding          PT Short Term Goals - 06/14/16 1040      PT SHORT TERM GOAL #1   Title same as LTGs           PT Long Term Goals - 06/14/16 1040      PT LONG TERM GOAL #1   Title Pt will be IND in performing modified HEP with assistance from spouse to improve strength, balance and safety. Target date: 07/11/16     PT LONG TERM GOAL #2   Title Pt will improve BERG score to >/=46/56 to decr. falls risk. Target date: 07/11/16   Status New     PT LONG TERM GOAL #3   Title Pt will improve TUG with  LRAD to </=13.5sec. to decrease falls risk. Target date: 07/11/16   Status New     PT LONG TERM GOAL #4   Title Pt will improve gait speed to >/=2.23ft/sec. with LRAD to safely ambulate in the community. Target date: 07/11/16   Status New     PT LONG TERM GOAL #5   Title Pt will improve ABC score to >/=28.1% to improve confidence in balance and quality of life. Target date: 07/11/16   Status New     Additional Long Term Goals   Additional Long Term Goals Yes     PT LONG TERM GOAL #6   Title Pt will amb. 500' over even/uneven terrain (including grassy terrain) at MOD I level with LRAD to improve functional mobility and allow pt to perform yard work safely. Target date: 07/11/16   Status New               Plan - 07/01/16 1545    Clinical Impression Statement Pt with difficulty following instructions at time.  Pt and husband report  she has not been doing her exercises and discussed OTAGO that was provided in previous bout of therapy.  Continue PT per POC.   Rehab Potential Good   Clinical Impairments Affecting Rehab Potential co-morbidities   PT Frequency 2x / week   PT Duration 4 weeks   PT Next Visit Plan Review OTAGO if pt brings it in;Modify HEP to incorporate dance wt. shifting steps if pt able to follow technique;single limb stance activities   Consulted and Agree with Plan of Care Patient;Family member/caregiver   Family Member Consulted husband       Patient will benefit from skilled therapeutic intervention in order to improve the following deficits and impairments:  Abnormal gait, Decreased knowledge of use of DME, Decreased strength, Decreased mobility, Decreased balance, Impaired sensation, Decreased cognition, Decreased endurance, Pain  Visit Diagnosis: Other abnormalities of gait and mobility  Muscle weakness (generalized)  Unsteadiness     Problem List Patient Active Problem List   Diagnosis Date Noted  . Dyslipidemia 06/17/2016  . Left hand weakness   . Ataxia   . CVA (cerebral infarction) 11/09/2015  . Memory deficit     Newell Coral 07/01/2016, 3:48 PM  [facility] [address] [place], [place], [address] Phone: [phone]   Fax:  [phone]  Name: [place] MRN: [id number] Date of Birth: Aug 07, 1920   Newell Coral, [facility] 07/01/16 3:48 PM Phone: [phone] Fax: [phone]

## 2016-07-04 ENCOUNTER — Ambulatory Visit: Payer: Medicare Other

## 2016-07-04 DIAGNOSIS — R2689 Other abnormalities of gait and mobility: Secondary | ICD-10-CM | POA: Diagnosis not present

## 2016-07-04 DIAGNOSIS — M6281 Muscle weakness (generalized): Secondary | ICD-10-CM

## 2016-07-04 NOTE — Therapy (Signed)
[facility] [address] [place], [place], [address] Phone: [phone]   Fax:  [phone]  Physical Therapy Treatment  Patient Details  Name: [place] MRN: [id number] Date of Birth: Sep 06, 1920 Referring Provider: Dr. Waynard Edwards  Encounter Date: 07/04/2016      PT End of Session - 07/04/16 1102    Visit Number 5   Number of Visits 9   Date for PT Re-Evaluation 07/13/16   Authorization Type G-CODE AND PROGRESS NOTE EVERY 10TH VISIT.   PT Start Time 1016   PT Stop Time 1058   PT Time Calculation (min) 42 min   Equipment Utilized During Treatment --  min guard to S   Activity Tolerance Patient tolerated treatment well   Behavior During Therapy WFL for tasks assessed/performed      Past Medical History:  Diagnosis Date  . Allergic rhinitis   . Anxiety   . Duodenal ulcer   . External hemorrhoid   . Hyperlipidemia   . Memory deficit   . Osteoarthritis   . Osteoporosis   . SUI (stress urinary incontinence), female   . Vitamin D deficiency     Past Surgical History:  Procedure Laterality Date  . APPENDECTOMY    . VAGINAL HYSTERECTOMY      There were no vitals filed for this visit.      Subjective Assessment - 07/04/16 1022    Subjective Pt denied falls or changes since last visit. 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   Patient is accompained by: Family member   Pertinent History Hx of 11/2015 of R CVA, memory issues, sleeping issues, osteoporosis, SUI, anxiety, hyperlipidemia, mild OA, hx of cataracts, HOH, DJD of Lx spine   Patient Stated Goals To get my legs working better.    Currently in Pain? No/denies                              Balance Exercises - 07/04/16 1023      OTAGO PROGRAM   Head Movements Sitting;5 reps   Neck Movements Sitting;5 reps   Back Extension Standing;5 reps   Trunk Movements Standing;5 reps   Ankle Movements Standing;10 reps   Knee Extensor  20 reps  with red band   Knee Flexor 10 reps   Hip ABductor 10 reps   Ankle Plantorflexors 20 reps, support   Ankle Dorsiflexors 20 reps, support   Knee Bends 10 reps, no support   Backwards Walking Support   Walking and Turning Around --  n/a   Sideways Walking Assistive device   Tandem Stance 10 seconds, no support   Tandem Walk Support   One Leg Stand 10 seconds, support   Heel Walking Support   Toe Walk --  n/a   Heel Toe Walking Backward --  n/a   Sit to Stand 10 reps, no support   Overall OTAGO Comments Cues for technique.    PT also added braiding to HEP, please see pt instructions for details.        PT Education - 07/04/16 1101    Education provided Yes   Education Details PT reiterated the importance of performing OTAGO 3x/week and PT picked 3 exercises for pt to perform if time is limited (STS, hip abd in standing, and braiding).    Person(s) Educated Patient;Spouse   Methods Explanation;Demonstration;Handout;Verbal cues;Tactile cues   Comprehension Verbalized understanding;Returned demonstration  PT Short Term Goals - 06/14/16 1040      PT SHORT TERM GOAL #1   Title same as LTGs           PT Long Term Goals - 06/14/16 1040      PT LONG TERM GOAL #1   Title Pt will be IND in performing modified HEP with assistance from spouse to improve strength, balance and safety. Target date: 07/11/16     PT LONG TERM GOAL #2   Title Pt will improve BERG score to >/=46/56 to decr. falls risk. Target date: 07/11/16   Status New     PT LONG TERM GOAL #3   Title Pt will improve TUG with LRAD to </=13.5sec. to decrease falls risk. Target date: 07/11/16   Status New     PT LONG TERM GOAL #4   Title Pt will improve gait speed to >/=2.24ft/sec. with LRAD to safely ambulate in the community. Target date: 07/11/16   Status New     PT LONG TERM GOAL #5   Title Pt will improve ABC score to >/=28.1% to improve confidence in balance and quality of life. Target  date: 07/11/16   Status New     Additional Long Term Goals   Additional Long Term Goals Yes     PT LONG TERM GOAL #6   Title Pt will amb. 500' over even/uneven terrain (including grassy terrain) at MOD I level with LRAD to improve functional mobility and allow pt to perform yard work safely. Target date: 07/11/16   Status New               Plan - 07/04/16 1158    Clinical Impression Statement Pt required cues during OTAGO HEP review. PT also added braiding to HEP in order to encourage lateral/post/ant weight shifting to improve balance during gait and pt was able to remember braiding, as she used to dance. Pt continues to require reiteration of importance of performing HEP as prescribed, but did verbalize understanding today. Continue with POC.    Rehab Potential Good   Clinical Impairments Affecting Rehab Potential co-morbidities   PT Frequency 2x / week   PT Duration 4 weeks   PT Next Visit Plan Begin to assess goals.   Consulted and Agree with Plan of Care Patient;Family member/caregiver   Family Member Consulted husband       Patient will benefit from skilled therapeutic intervention in order to improve the following deficits and impairments:  Abnormal gait, Decreased knowledge of use of DME, Decreased strength, Decreased mobility, Decreased balance, Impaired sensation, Decreased cognition, Decreased endurance, Pain  Visit Diagnosis: Other abnormalities of gait and mobility  Muscle weakness (generalized)     Problem List Patient Active Problem List   Diagnosis Date Noted  . Dyslipidemia 06/17/2016  . Left hand weakness   . Ataxia   . CVA (cerebral infarction) 11/09/2015  . Memory deficit     Mellissa Conley L 07/04/2016, 12:01 PM  [facility] [address] [place], [place], [address] Phone: [phone]   Fax:  [phone]  Name: [place] MRN: [id number] Date of Birth:  [birth year]   Zerita Boers, PT,DPT 07/04/16 12:01 PM Phone: [phone] Fax: [phone]

## 2016-07-04 NOTE — Patient Instructions (Signed)
Braiding    Hold on to counter with both hands. Move to side: 1) cross right leg in front of left, 2) bring back leg out to side, then 3) cross right leg behind left, 4) bring left leg out to side. Continue sequence in same direction. Reverse sequence, moving in opposite direction. Repeat sequence __4__ times per session. Do __1__ sessions per day.   Copyright  VHI. All rights reserved.

## 2016-07-08 ENCOUNTER — Ambulatory Visit: Payer: Medicare Other

## 2016-07-08 DIAGNOSIS — R2689 Other abnormalities of gait and mobility: Secondary | ICD-10-CM

## 2016-07-08 DIAGNOSIS — M6281 Muscle weakness (generalized): Secondary | ICD-10-CM

## 2016-07-08 NOTE — Therapy (Signed)
[facility] [address] [place], [place], [address] Phone: [phone]   Fax:  [phone]  Physical Therapy Treatment  Patient Details  Name: Madison Maxwell MRN: [id number] Date of Birth: Feb 27, 1920 Referring Provider: Dr. Joylene Draft  Encounter Date: 07/08/2016      PT End of Session - 07/08/16 1256    Visit Number 6   Number of Visits 9   Date for PT Re-Evaluation 07/13/16   Authorization Type G-CODE AND PROGRESS NOTE EVERY 10TH VISIT.   PT Start Time 1146   PT Stop Time 1227   PT Time Calculation (min) 41 min   Equipment Utilized During Treatment Gait belt   Activity Tolerance Patient tolerated treatment well   Behavior During Therapy WFL for tasks assessed/performed      Past Medical History:  Diagnosis Date  . Allergic rhinitis   . Anxiety   . Duodenal ulcer   . External hemorrhoid   . Hyperlipidemia   . Memory deficit   . Osteoarthritis   . Osteoporosis   . SUI (stress urinary incontinence), female   . Vitamin D deficiency     Past Surgical History:  Procedure Laterality Date  . APPENDECTOMY    . VAGINAL HYSTERECTOMY      There were no vitals filed for this visit.      Subjective Assessment - 07/08/16 1149    Subjective Pt denied falls or changes since last visit.    Patient is accompained by: Family member   Pertinent History Hx of 11/2015 of R CVA, memory issues, sleeping issues, osteoporosis, SUI, anxiety, hyperlipidemia, mild OA, hx of cataracts, HOH, DJD of Lx spine   Patient Stated Goals To get my legs working better.    Currently in Pain? Yes   Pain Score 1    Pain Location Foot   Pain Descriptors / Indicators Aching   Pain Type Acute pain   Pain Onset 1 to 4 weeks ago   Pain Frequency Intermittent   Aggravating Factors  rest   Pain Relieving Factors hitting foot against something and sometimes walking                         [facility] Adult PT Treatment/Exercise -  07/08/16 1152      Ambulation/Gait   Ambulation/Gait Yes   Ambulation/Gait Assistance 5: Supervision;4: Min guard   Ambulation/Gait Assistance Details Pt amb. without SPC over even terrain with and without head turns.    Ambulation Distance (Feet) 345 Feet  no AD and 59' with SPC.   Assistive device Straight cane;None   Gait Pattern Step-through pattern;Decreased stride length;Decreased arm swing - left   Ambulation Surface Level;Indoor     Standardized Balance Assessment   Standardized Balance Assessment Berg Balance Test     Berg Balance Test   Sit to Stand Able to stand without using hands and stabilize independently   Standing Unsupported Able to stand safely 2 minutes   Sitting with Back Unsupported but Feet Supported on Floor or Stool Able to sit safely and securely 2 minutes   Stand to Sit Sits safely with minimal use of hands   Transfers Able to transfer safely, minor use of hands   Standing Unsupported with Eyes Closed Able to stand 10 seconds safely   Standing Ubsupported with Feet Together Able to place feet together independently and stand 1 minute safely   From Standing, Reach Forward with Outstretched Arm Can reach forward >12 cm  safely (5")  7"   From Standing Position, Pick up Object from [place] to pick up shoe safely and easily   From Standing Position, Turn to Look Behind Over each Shoulder Looks behind from both sides and weight shifts well   Turn 360 Degrees Able to turn 360 degrees safely one side only in 4 seconds or less   Standing Unsupported, Alternately Place Feet on Step/Stool Able to complete >2 steps/needs minimal assist   Standing Unsupported, One Foot in Front Able to plae foot ahead of the other independently and hold 30 seconds   Standing on One Leg Able to lift leg independently and hold > 10 seconds  L SLS 11sec. and R SLS 3 sec. 2/2 R foot pain   Total Score 50     High Level Balance   High Level Balance Activities Head turns;Other  (comment);Turns  walking forwards   High Level Balance Comments Performed over compliant surfaces: red and blue mats, 6x7'/activity. Cues for technique and to improve heel strike and lateral wt. shifting. Performed with min guard to min A for safety and to maintain balance.                 PT Education - 07/08/16 1255    Education provided Yes   Education Details Pt discussed goal progress and the importance of continuing HEP as prescribed, after d/c from PT.    Person(s) Educated Patient;Spouse   Methods Explanation   Comprehension Verbalized understanding          PT Short Term Goals - 06/14/16 1040      PT SHORT TERM GOAL #1   Title same as LTGs           PT Long Term Goals - 07/08/16 1305      PT LONG TERM GOAL #1   Title Pt will be IND in performing modified HEP with assistance from spouse to improve strength, balance and safety. Target date: 07/11/16     PT LONG TERM GOAL #2   Title Pt will improve BERG score to >/=46/56 to decr. falls risk. Target date: 07/11/16   Status Achieved     PT LONG TERM GOAL #3   Title Pt will improve TUG with LRAD to </=13.5sec. to decrease falls risk. Target date: 07/11/16   Status New     PT LONG TERM GOAL #4   Title Pt will improve gait speed to >/=2.50f/sec. with LRAD to safely ambulate in the community. Target date: 07/11/16   Status New     PT LONG TERM GOAL #5   Title Pt will improve ABC score to >/=28.1% to improve confidence in balance and quality of life. Target date: 07/11/16   Status New     PT LONG TERM GOAL #6   Title Pt will amb. 500' over even/uneven terrain (including grassy terrain) at MOD I level with LRAD to improve functional mobility and allow pt to perform yard work safely. Target date: 07/11/16   Status New               Plan - 07/08/16 1256    Clinical Impression Statement Pt demonstrated progress, as she met LTG2 (incr. BERG score). Pt contineus to experience incr. postural sway and LOB  during balance activities over compliant surfaces. PT will assess remaining goals and d/c if appropriate next session.   Rehab Potential Good   Clinical Impairments Affecting Rehab Potential co-morbidities   PT Frequency 2x / week   PT Duration  4 weeks   PT Next Visit Plan G-CODE, check goals and d/c.   PT Home Exercise Plan OTAGO   Consulted and Agree with Plan of Care Patient;Family member/caregiver   Family Member Consulted husband       Patient will benefit from skilled therapeutic intervention in order to improve the following deficits and impairments:  Abnormal gait, Decreased knowledge of use of DME, Decreased strength, Decreased mobility, Decreased balance, Impaired sensation, Decreased cognition, Decreased endurance, Pain  Visit Diagnosis: Other abnormalities of gait and mobility  Muscle weakness (generalized)     Problem List Patient Active Problem List   Diagnosis Date Noted  . Dyslipidemia 06/17/2016  . Left hand weakness   . Ataxia   . CVA (cerebral infarction) 11/09/2015  . Memory deficit     Zylie Mumaw L 07/08/2016, 1:06 PM  [facility] [address] [place], [place], [address] Phone: [phone]   Fax:  [phone]  Name: Madison Maxwell MRN: [id number] Date of Birth: [birth year]   Geoffry Paradise, PT,DPT 07/08/16 1:06 PM Phone: [phone] Fax: [phone]

## 2016-07-10 ENCOUNTER — Ambulatory Visit: Payer: Medicare Other

## 2016-07-10 DIAGNOSIS — R2689 Other abnormalities of gait and mobility: Secondary | ICD-10-CM

## 2016-07-10 DIAGNOSIS — M6281 Muscle weakness (generalized): Secondary | ICD-10-CM

## 2016-07-10 NOTE — Therapy (Signed)
[facility] [address] [place], [place], [address] Phone: [phone]   Fax:  [phone]  Physical Therapy Treatment  Patient Details  Name: Madison Maxwell MRN: [id number] Date of Birth: 09/06/1920 Referring Provider: Dr. Joylene Draft  Encounter Date: 07/10/2016      PT End of Session - 07/10/16 1353    Visit Number 7   Number of Visits 9   Date for PT Re-Evaluation 07/13/16   Authorization Type G-CODE AND PROGRESS NOTE EVERY 10TH VISIT.   PT Start Time 1324   PT Stop Time 1347  d/c   PT Time Calculation (min) 23 min   Activity Tolerance Patient tolerated treatment well   Behavior During Therapy WFL for tasks assessed/performed      Past Medical History:  Diagnosis Date  . Allergic rhinitis   . Anxiety   . Duodenal ulcer   . External hemorrhoid   . Hyperlipidemia   . Memory deficit   . Osteoarthritis   . Osteoporosis   . SUI (stress urinary incontinence), female   . Vitamin D deficiency     Past Surgical History:  Procedure Laterality Date  . APPENDECTOMY    . VAGINAL HYSTERECTOMY      There were no vitals filed for this visit.      Subjective Assessment - 07/10/16 1327    Subjective Pt denied falls or changes since last visit.    Patient is accompained by: Family member   Pertinent History Hx of 11/2015 of R CVA, memory issues, sleeping issues, osteoporosis, SUI, anxiety, hyperlipidemia, mild OA, hx of cataracts, HOH, DJD of Lx spine   Patient Stated Goals To get my legs working better.    Currently in Pain? No/denies                         [facility] Adult PT Treatment/Exercise - 07/10/16 1328      Ambulation/Gait   Ambulation/Gait Yes   Ambulation/Gait Assistance 6: Modified independent (Device/Increase time)   Ambulation/Gait Assistance Details Pt amb. with and without SPC, pt noted to decr. speed during gait over grassy terrain but no LOB or incr. postural sway. Pt demo'd  proper technique.   Ambulation Distance (Feet) 50 Feet  x4 and 500'   Assistive device Straight cane;None   Gait Pattern Step-through pattern;Decreased stride length;Decreased arm swing - left   Ambulation Surface Level;Unlevel;Indoor;Outdoor;Paved;Grass;Gravel   Gait velocity 2.90f/sec. with SPC and 3.248fsec. without AD    TUG with SPC: 15.5sec. And without AD: 14.4sec.  ABC completed after session: 38.8%-scores closer to 100% indicate incr. Confidence in balance.          PT Education - 07/10/16 1352    Education provided Yes   Education Details PT discussed goals and outcome measure results. PT discussed d/c and pt agreeable. PT reiterated the importance of continuing PT HEP after d/c to maintain gains.   Person(s) Educated Patient;Spouse   Methods Explanation   Comprehension Verbalized understanding          PT Short Term Goals - 06/14/16 1040      PT SHORT TERM GOAL #1   Title same as LTGs           PT Long Term Goals - 07/10/16 1354      PT LONG TERM GOAL #1   Title Pt will be IND in performing modified HEP with assistance from spouse to improve strength, balance and safety. Target date: 07/11/16  Status Achieved     PT LONG TERM GOAL #2   Title Pt will improve BERG score to >/=46/56 to decr. falls risk. Target date: 07/11/16   Status Achieved     PT LONG TERM GOAL #3   Title Pt will improve TUG with LRAD to </=13.5sec. to decrease falls risk. Target date: 07/11/16   Status Partially Met     PT LONG TERM GOAL #4   Title Pt will improve gait speed to >/=2.56f/sec. with LRAD to safely ambulate in the community. Target date: 07/11/16   Baseline met without AD but not with SPC   Status Partially Met     PT LONG TERM GOAL #5   Title Pt will improve ABC score to >/=28.1% to improve confidence in balance and quality of life. Target date: 07/11/16   Status Achieved     PT LONG TERM GOAL #6   Title Pt will amb. 500' over even/uneven terrain (including  grassy terrain) at MOD I level with LRAD to improve functional mobility and allow pt to perform yard work safely. Target date: 07/11/16   Status Achieved               Plan - [id number]    Clinical Impression Statement Pt met LTGs 1, 5 and 6. Pt partially met LTGs 3 and 4. Please see pt d/c summary for details.   Rehab Potential Good   Clinical Impairments Affecting Rehab Potential co-morbidities   PT Frequency 2x / week   PT Duration 4 weeks   PT Next Visit Plan G-CODE, check goals and d/c.   PT Home Exercise Plan OTAGO   Consulted and Agree with Plan of Care Patient;Family member/caregiver   Family Member Consulted husband       Patient will benefit from skilled therapeutic intervention in order to improve the following deficits and impairments:  Abnormal gait, Decreased knowledge of use of DME, Decreased strength, Decreased mobility, Decreased balance, Impaired sensation, Decreased cognition, Decreased endurance, Pain  Visit Diagnosis: Other abnormalities of gait and mobility  Muscle weakness (generalized)       G-Codes - [id number]/[id number]    Functional Assessment Tool Used BERG: 50/56; TUG with SPC: 15.5sec. and 14.4sec. no AD; gait speed with SPC: 2.230fsec. and 3.2227fec. no AD   Functional Limitation Mobility: Walking and moving around   Mobility: Walking and Moving Around Goal Status ([id number] least 1 percent but less than 20 percent impaired, limited or restricted   Mobility: Walking and Moving Around Discharge Status ([id number] least 1 percent but less than 20 percent impaired, limited or restricted      Problem List Patient Active Problem List   Diagnosis Date Noted  . Dyslipidemia 06/17/2016  . Left hand weakness   . Ataxia   . CVA (cerebral infarction) 11/09/2015  . Memory deficit     Reve Crocket L 8/1Aug 21, 2017:56 PM  [id number]:  [phone]:  [phone]: AvaANNIE SAEPHANN: [phone] of Birth: 6/1Jun 20, 1921HYSICAL THERAPY DISCHARGE SUMMARY  Visits from Start of Care: 7  Current functional level related to goals / functional outcomes:     PT Long Term Goals - 08/Aug 21, 201754      PT LONG TERM GOAL #1   Title Pt will be IND in performing modified HEP with assistance from spouse to improve strength, balance and safety. Target date: 07/11/16   Status Achieved  PT LONG TERM GOAL #2   Title Pt will improve BERG score to >/=46/56 to decr. falls risk. Target date: 07/11/16   Status Achieved     PT LONG TERM GOAL #3   Title Pt will improve TUG with LRAD to </=13.5sec. to decrease falls risk. Target date: 07/11/16   Status Partially Met     PT LONG TERM GOAL #4   Title Pt will improve gait speed to >/=2.63f/sec. with LRAD to safely ambulate in the community. Target date: 07/11/16   Baseline met without AD but not with SPC   Status Partially Met     PT LONG TERM GOAL #5   Title Pt will improve ABC score to >/=28.1% to improve confidence in balance and quality of life. Target date: 07/11/16   Status Achieved     PT LONG TERM GOAL #6   Title Pt will amb. 500' over even/uneven terrain (including grassy terrain) at MOD I level with LRAD to improve functional mobility and allow pt to perform yard work safely. Target date: 07/11/16   Status Achieved        Remaining deficits: Decr. Endurance and requires AD over uneven terrain.   Education / Equipment: HEP  Plan: Patient agrees to discharge.  Patient goals were met. Patient is being discharged due to meeting the stated rehab goals.  ?????           JGeoffry Paradise PT,DPT 07/10/16 1:56 PM Phone: [phone]: [phone]

## 2016-10-20 ENCOUNTER — Other Ambulatory Visit (HOSPITAL_COMMUNITY): Payer: Self-pay | Admitting: *Deleted

## 2016-10-21 ENCOUNTER — Ambulatory Visit (HOSPITAL_COMMUNITY)
Admission: RE | Admit: 2016-10-21 | Discharge: 2016-10-21 | Disposition: A | Discharge status: Home / Self Care | Payer: Medicare Other | Source: Ambulatory Visit | Attending: Internal Medicine | Admitting: Internal Medicine

## 2016-10-21 DIAGNOSIS — M810 Age-related osteoporosis without current pathological fracture: Secondary | ICD-10-CM | POA: Diagnosis present

## 2016-10-21 MED ORDER — DENOSUMAB 60 MG/ML ~~LOC~~ SOLN
60.0000 mg | Freq: Once | SUBCUTANEOUS | Status: AC
  Administered 2016-10-21: 60 mg via SUBCUTANEOUS
  Filled 2016-10-21: qty 1

## 2016-12-17 ENCOUNTER — Ambulatory Visit: Payer: Medicare Other | Admitting: Nurse Practitioner

## 2016-12-23 ENCOUNTER — Encounter: Payer: Self-pay | Admitting: Nurse Practitioner

## 2016-12-23 ENCOUNTER — Ambulatory Visit (INDEPENDENT_AMBULATORY_CARE_PROVIDER_SITE_OTHER): Payer: Medicare Other | Admitting: Nurse Practitioner

## 2016-12-23 VITALS — BP 136/68 | HR 71 | Ht 65.0 in | Wt 101.6 lb

## 2016-12-23 DIAGNOSIS — E785 Hyperlipidemia, unspecified: Secondary | ICD-10-CM | POA: Diagnosis not present

## 2016-12-23 DIAGNOSIS — R269 Unspecified abnormalities of gait and mobility: Secondary | ICD-10-CM | POA: Diagnosis not present

## 2016-12-23 DIAGNOSIS — I639 Cerebral infarction, unspecified: Secondary | ICD-10-CM | POA: Diagnosis not present

## 2016-12-23 NOTE — Progress Notes (Signed)
[facility]  PATIENT: Madison Maxwell DOB: 1920/10/09   REASON FOR VISIT: Follow-up for CVA, gait disorder, dyslipidemia, memory loss HISTORY FROM: Patient, and husband, daughter  HISTORY OF PRESENT ILLNESS:UPDATE 01/23/2018CM Madison Maxwell, 83 year old female returns for follow-up for history of stroke that occurred in December 2016. She is currently on aspirin 325 daily with minimal bruising no bleeding and no recurrent stroke or TIA symptoms since that time. She remains on Lipitor followed by Dr. Waynard EdwardsPerini her primary care. Prior to her stroke event she had memory loss and is treated with Exelon. She ambulates with a single-point cane. Her therapies have concluded. She intermittently does her home exercise program. No recent falls. Independent with activities of daily living. She returns for reevaluation   UPDATE 7/18/17CM Madison Maxwell, 83 year old female returns for follow-up. She has history of stroke that occurred December 2016. She has not had further stroke or TIA symptoms since that time she is currently on aspirin 325 daily with minimal  bruising. She remains on Lipitor. Her labs are followed  by Dr. Waynard EdwardsPerini. Prior to her stroke she was on Exelon for mild memory loss. That appears to be stable. She is currently receiving outpatient physical therapy ordered by Dr. Waynard EdwardsPerini for generalized weakness. She ambulates with single-point cane, she denies any falls. She returns for reevaluation    HISTORY 12/20/2015 CM Madison Maxwell, 83 year old female was admitted 11/09/2015 after having memory deficits and difficulty using her left hand and gait instability. This has been going on for approximately 3 days and then she developed sudden onset of gait instability and trouble walking. She had weakness in her left hand and trouble doing coordinated tasks. No history of prior CVA or TIA. Was taking aspirin 0.81 at home. TPA not given due to outside TPA window. LDL noted to be elevated at  139 she was started on Lipitor. MRI of the head small acute white matter infarcts in the right frontal lobe. Chronic small vessel disease No associated hemorrhage or mass effect. Intracranial MRA negative. Carotid Doppler 1-39% plaquing. 2-D echo ejection fraction 60-65% no cardiac source of emboli identified. She was discharged home with home health physical therapy on 11/12/2015. Her therapy has now completed after about 6 weeks. She continues to do home exercise program. She has not had further stroke or TIA symptoms She returns for reevaluation   REVIEW OF SYSTEMS: Full 14 system review of systems performed and notable only for those listed, all others are neg:  Constitutional: neg  Cardiovascular: neg Ear/Nose/Throat: neg  Skin: neg Eyes: neg Respiratory: neg Gastroitestinal: neg  Hematology/Lymphatic: neg  Endocrine: neg Musculoskeletal: Gait abnormality Allergy/Immunology: neg Neurological: memory loss on Exelon Psychiatric: neg Sleep : Insomnia currently on Ambien   ALLERGIES: No Known Allergies  HOME MEDICATIONS: Outpatient Medications Prior to Visit  Medication Sig Dispense Refill  . aspirin EC 325 MG EC tablet Take 1 tablet (325 mg total) by mouth daily. 30 tablet 0  . atorvastatin (LIPITOR) 20 MG tablet Take 1 tablet (20 mg total) by mouth daily at 6 PM. 30 tablet 0  . cholecalciferol (VITAMIN D) 1000 units tablet Take 1,000 Units by mouth daily.    Marland Kitchen. denosumab (PROLIA) 60 MG/ML SOLN injection Inject 60 mg into the skin every 6 (six) months. Administer in upper arm, thigh, or abdomen    . Multiple Vitamins-Minerals (MULTIVITAMINS THER. W/MINERALS) TABS Take 1 tablet by mouth daily.    Marland Kitchen. omega-3 fish oil (MAXEPA) 1000 MG CAPS capsule Take by mouth daily.    .Marland Kitchen  rivastigmine (EXELON) 9.5 mg/24hr Place 9.5 mg onto the skin daily.     . vitamin B-12 (CYANOCOBALAMIN) 1000 MCG tablet Take 1,000 mcg by mouth daily.    . vitamin C (ASCORBIC ACID) 500 MG tablet Take 500 mg by mouth  daily.    Marland Kitchen zolpidem (AMBIEN) 5 MG tablet Take 5 mg by mouth at bedtime.   0   No facility-administered medications prior to visit.     PAST MEDICAL HISTORY: Past Medical History:  Diagnosis Date  . Allergic rhinitis   . Anxiety   . Duodenal ulcer   . External hemorrhoid   . Hyperlipidemia   . Memory deficit   . Osteoarthritis   . Osteoporosis   . SUI (stress urinary incontinence), female   . Vitamin D deficiency     PAST SURGICAL HISTORY: Past Surgical History:  Procedure Laterality Date  . APPENDECTOMY    . VAGINAL HYSTERECTOMY      FAMILY HISTORY: Family History  Problem Relation Age of Onset  . Colon polyps Sister   . Colon cancer Neg Hx     SOCIAL HISTORY: Social History   Social History  . Marital status: Married    Spouse name: N/A  . Number of children: 2  . Years of education: N/A   Occupational History  . [employer]     Retired    Social History Main Topics  . Smoking status: Never Smoker  . Smokeless tobacco: Never Used  . Alcohol use No  . Drug use: No  . Sexual activity: Not on file   Other Topics Concern  . Not on file   Social History Narrative   Daily caffeine      PHYSICAL EXAM  Vitals:   12/23/16 1245  BP: 136/68  Pulse: 71  Weight: 101 lb 9.6 oz (46.1 kg)  Height: 5\' 5"  (1.651 m)   Body mass index is 16.91 kg/m. Generalized: Well developed, frail appearing female in no acute distress  Head: normocephalic and atraumatic,. Oropharynx benign  Neck: Supple, no carotid bruits  Cardiac: Regular rate rhythm, no murmur  Musculoskeletal: No deformity   Neurological examination   Mentation: Alert oriented to time, place, history taking. Attention span and concentration appropriate.  Follows all commands speech and language fluent.   Cranial nerve II-XII: Pupils were equal round reactive to light extraocular movements were full, visual field were full on confrontational test. Facial sensation and strength were  normal. hearing was intact to finger rubbing bilaterally. Uvula tongue midline. head turning and shoulder shrug were normal and symmetric.Tongue protrusion into cheek strength was normal. Motor: normal bulk and tone, full strength in the BUE, BLE, except 4/5 hip ext and flexion.Fine finger movements normal, no pronator drift.    Sensory: normal and symmetric to light touch, pinprick, vibratory In the upper and lower extremities  Coordination: finger-nose-finger, heel-to-shin bilaterally, no dysmetria Reflexes: Depressed upper and lower plantar responses were flexor bilaterally. Gait and Station: Rising up from seated position without assistance, wide based stance, mildly unsteady gait with cane  DIAGNOSTIC DATA (LABS, IMAGING, TESTING) - I reviewed patient records, labs, notes, testing and imaging myself where available.  Lab Results  Component Value Date   WBC 5.2 01/03/2016   HGB 12.6 01/03/2016   HCT 37.0 01/03/2016   MCV 95.0 01/03/2016   PLT 137 (L) 01/03/2016      Component Value Date/Time   NA 142 01/03/2016 1646   K 4.1 01/03/2016 1646   CL 103 01/03/2016 1646  CO2 27 01/03/2016 1638   GLUCOSE 104 (H) 01/03/2016 1646   BUN 23 (H) 01/03/2016 1646   CREATININE 0.60 01/03/2016 1646   CALCIUM 9.6 01/03/2016 1638   PROT 6.3 (L) 01/03/2016 1638   ALBUMIN 4.1 01/03/2016 1638   AST 27 01/03/2016 1638   ALT 25 01/03/2016 1638   ALKPHOS 48 01/03/2016 1638   BILITOT 0.6 01/03/2016 1638   GFRNONAA >60 01/03/2016 1638   GFRAA >60 01/03/2016 1638     ASSESSMENT AND PLAN 83 y.o. year old female has a past medical history of Hyperlipidemia; Memory deficit; Anxiety;  Osteoarthritis; Osteoporosis; and Vitamin D deficiency.and right frontal lobe infarct secondary to small vessel disease. She has not had further stroke or TIA symptoms since discharge   PLAN: Continue aspirin 325 for secondary stroke prevention Continue Lipitor for cholesterol and lipids to be followed by Dr.  Darcus Austin Continue home exercise program  Use cane for safe ambulation and to prevent falls Will discharge from neurologic services Greater than 50% of time during this 15 minute visit was spent on counseling,explanation of diagnosis, continued importance of stroke risk factor modification which will be followed by her primary care . Nilda Riggs, [facility], [facility], APRN  [facility] [address] [place], [place] [address] [phone]

## 2016-12-23 NOTE — Patient Instructions (Signed)
Continue aspirin 325 for secondary stroke prevention Continue Lipitor for cholesterol and lipids to be followed by Dr. Darcus AustinPerrini Continue home exercise program  Use cane for safe ambulation and to prevent falls Will discharge from neurologic services

## 2016-12-27 NOTE — Progress Notes (Signed)
I reviewed above note and agree with the assessment and plan.  Marvel PlanJindong Dalan Cowger, MD PhD Stroke Neurology 12/27/2016 5:10 PM

## 2017-01-13 ENCOUNTER — Ambulatory Visit: Payer: Medicare Other | Attending: Internal Medicine

## 2017-01-13 DIAGNOSIS — R293 Abnormal posture: Secondary | ICD-10-CM | POA: Diagnosis present

## 2017-01-13 DIAGNOSIS — R2689 Other abnormalities of gait and mobility: Secondary | ICD-10-CM | POA: Insufficient documentation

## 2017-01-13 DIAGNOSIS — M6281 Muscle weakness (generalized): Secondary | ICD-10-CM

## 2017-01-13 DIAGNOSIS — R2681 Unsteadiness on feet: Secondary | ICD-10-CM | POA: Diagnosis present

## 2017-01-13 NOTE — Therapy (Signed)
[facility] [address] [place], [place], [address] Phone: [phone]   Fax:  [phone]  Physical Therapy Evaluation  Patient Details  Name: ROSAMAE ROCQUE MRN: [id number] Date of Birth: [birth year] Referring Provider: Dr. Waynard Edwards  Encounter Date: 01/13/2017      PT End of Session - 01/13/17 1629    Visit Number 1   Number of Visits 17   Date for PT Re-Evaluation 03/14/17   Authorization Type G-CODE AND PROGRESS NOTE EVERY 10TH VISIT.   PT Start Time 1531   PT Stop Time 1613   PT Time Calculation (min) 42 min   Equipment Utilized During Treatment --  min guard to S prn   Activity Tolerance Patient tolerated treatment well   Behavior During Therapy WFL for tasks assessed/performed      Past Medical History:  Diagnosis Date  . Allergic rhinitis   . Anxiety   . Duodenal ulcer   . External hemorrhoid   . Hyperlipidemia   . Memory deficit   . Osteoarthritis   . Osteoporosis   . SUI (stress urinary incontinence), female   . Vitamin D deficiency     Past Surgical History:  Procedure Laterality Date  . APPENDECTOMY    . VAGINAL HYSTERECTOMY      There were no vitals filed for this visit.       Subjective Assessment - 01/13/17 1539    Subjective Pt is currently taking a 7 day antibiotic for an UTI. Pt presents today with intermittent LBP during supine to sit txfs (pt states it takes longer to perform txfs more so than pain) and B LE weakness.    Patient is accompained by: Family member  Mahlon   Pertinent History Hx of 11/2015 of R CVA, memory issues, sleeping issues, osteoporosis, SUI, anxiety, hyperlipidemia, mild OA, hx of cataracts, HOH, DJD of Lx spine   Patient Stated Goals "Strengthen my legs and help with bed txfs and mobility, as well as balance"   Currently in Pain? No/denies            [facility] PT Assessment - 01/13/17 1542      Assessment   Medical Diagnosis Lumbar back pain, B leg  weakness   Referring Provider Dr. Waynard Edwards   Onset Date/Surgical Date 09/12/16   Hand Dominance Right   Prior Therapy [facility]PP[facility] neuro     Precautions   Precautions Fall     Restrictions   Weight Bearing Restrictions No     Balance Screen   Has the patient fallen in the past 6 months Yes   How many times? 1  Pt's husband reported x-rays were negative-she was on ambien   Has the patient had a decrease in activity level because of a fear of falling?  Yes   Is the patient reluctant to leave their home because of a fear of falling?  No     Home [employer] information [employer] residence   Living Arrangements Spouse/significant other   Available Help at Discharge Family   Type of Home House   Home Access Stairs to enter   Entrance Stairs-Number of Steps 1   Entrance Stairs-Rails None   Home Layout One level   Home Equipment Walker - 2 wheels;Cane - single point;Shower seat - built in;Grab bars - tub/shower;Toilet riser;Grab bars - toilet     Prior Function   Level of Independence Independent with household mobility with device   Vocation Retired  Cognition   Overall Cognitive Status History of cognitive impairments - at baseline     Sensation   Light Touch Appears Intact   Additional Comments B LE/UE sensation WNL.     Coordination   Gross Motor Movements are Fluid and Coordinated Yes   Fine Motor Movements are Fluid and Coordinated No  decr. speed during thumb to fingers     Posture/Postural Control   Posture/Postural Control Postural limitations   Postural Limitations Rounded Shoulders;Forward head;Posterior pelvic tilt     ROM / Strength   AROM / PROM / Strength AROM;Strength     AROM   Overall AROM  Within functional limits for tasks performed   Overall AROM Comments BLE AROM WFL     Strength   Overall Strength Deficits   Overall Strength Comments B LE: hip flex: 3+/5, knee ext: 4/5, knee flex: 4/5, ankle DF: 4/5. Gross hip abd/add in sitting: 3+/5. B hip  ext. Weakness suspected 2/2 gait deviations.      Transfers   Transfers Sit to Stand;Stand to Sit   Sit to Stand 5: Supervision;With upper extremity assist;From chair/3-in-1   Stand to Sit 5: Supervision;With upper extremity assist;To chair/3-in-1     Ambulation/Gait   Ambulation/Gait Yes   Ambulation/Gait Assistance 5: Supervision   Ambulation/Gait Assistance Details Decr. arm swing and S for safety.   Ambulation Distance (Feet) 100 Feet   Assistive device None   Gait Pattern Step-through pattern;Decreased stride length;Decreased arm swing - right;Decreased arm swing - left;Narrow base of support;Decreased dorsiflexion - left   Ambulation Surface Level;Indoor   Gait velocity 1.62ft/sec.     Standardized Balance Assessment   Standardized Balance Assessment Berg Balance Test;Timed Up and Go Test     Berg Balance Test   Sit to Stand Able to stand  independently using hands   Standing Unsupported Able to stand safely 2 minutes   Sitting with Back Unsupported but Feet Supported on Floor or Stool Able to sit safely and securely 2 minutes   Stand to Sit Sits safely with minimal use of hands   Transfers Able to transfer with verbal cueing and /or supervision   Standing Unsupported with Eyes Closed Able to stand 10 seconds safely   Standing Ubsupported with Feet Together Able to place feet together independently and stand for 1 minute with supervision   From Standing, Reach Forward with Outstretched Arm Can reach forward >12 cm safely (5")  7"   From Standing Position, Pick up Object from Floor Able to pick up shoe, needs supervision   From Standing Position, Turn to Look Behind Over each Shoulder Looks behind one side only/other side shows less weight shift   Turn 360 Degrees Needs close supervision or verbal cueing   Standing Unsupported, Alternately Place Feet on Step/Stool Able to complete >2 steps/needs minimal assist   Standing Unsupported, One Foot in Front Able to take small step  independently and hold 30 seconds   Standing on One Leg Tries to lift leg/unable to hold 3 seconds but remains standing independently   Total Score 38     Timed Up and Go Test   TUG Normal TUG   Normal TUG (seconds) 17.5  none                           PT Education - 01/13/17 1628    Education provided Yes   Education Details PT discussed POC, freuqency/duration and outcome measure results.  Person(s) Educated Patient;Spouse   Methods Explanation   Comprehension Verbalized understanding          PT Short Term Goals - 01/13/17 1636      PT SHORT TERM GOAL #1   Title Pt will be IND with HEP to improve balance, strength, LBP and endurance. TARGET DATE FOR ALL STGS: 02/10/17   Status New     PT SHORT TERM GOAL #2   Title Pt will improve BERG score to >/=42/56 to decr. falls risk.    Status New     PT SHORT TERM GOAL #3   Title Pt will amb. 500' over even terrain, IND, to improve functional mobilty.    Status New     PT SHORT TERM GOAL #4   Title Pt will perform TUG without AD i n</=13.5sec. to decr. falls risk.   Status New           PT Long Term Goals - 01/13/17 1638      PT LONG TERM GOAL #1   Title Pt will verbalize understanding of fall risk prevention strategies to decr. falls risk. TARGET DATE FOR ALL LTGS: 03/10/17   Status New     PT LONG TERM GOAL #2   Title Pt will improve BERG score to >/=46/56 to decr. falls risk.    Status New     PT LONG TERM GOAL #3   Title Pt will amb. 700' over even/uneven terrain with SPC, at MOD I level to decr. falls risk.    Status New     PT LONG TERM GOAL #4   Title Pt will improve gait speed to >/=2.7962ft/sec. with LRAD to safely ambulate in the community.    Status New               Plan - 01/13/17 1630    Clinical Impression Statement Pt is a pleasant 83 y/o female presenting to [facility] neuro for BLE weakness and LBP. Pt is familiar to this clinic with a hx of a R CVA. Pt's PMH significant  for the following: Hx of 11/2015 of R CVA, memory issues, sleeping issues, osteoporosis, SUI, anxiety, hyperlipidemia, mild OA, hx of cataracts, HOH, DJD of Lx spine. Pt presented with the following impairments: gait deviations, impaired balance, decr. strength, decr. coordination, impaired flexibility and decr. endurance. Pt also presented with B scapulae winging while standing with feet apart. No pain present during eval but PT will continue to monitor closely.  Pt's gait speed, TUG time and BERG score all indicate pt is at risk for falls. Pt would benefit from skilled PT to improve safety during functional mobility.    Rehab Potential Good   Clinical Impairments Affecting Rehab Potential see PMH   PT Frequency 2x / week   PT Duration 8 weeks   PT Treatment/Interventions ADLs/Self Care Home Management;Biofeedback;Canalith Repostioning;Electrical Stimulation;Neuromuscular re-education;Balance training;Therapeutic exercise;Therapeutic activities;Functional mobility training;Stair training;Gait training;Orthotic Fit/Training;DME Instruction;Patient/family education;Vestibular   PT Next Visit Plan Provide falls risk handout. Assess bed mobility and LBP and Initiate parts of OTAGO as HEP   Consulted and Agree with Plan of Care Patient      Patient will benefit from skilled therapeutic intervention in order to improve the following deficits and impairments:  Abnormal gait, Decreased endurance, Decreased mobility, Decreased balance, Decreased coordination, Decreased cognition, Pain, Impaired flexibility, Postural dysfunction, Decreased strength  Visit Diagnosis: Other abnormalities of gait and mobility - Plan: PT plan of care cert/re-cert  Muscle weakness (generalized) - Plan: PT plan of care  cert/re-cert  Abnormal posture - Plan: PT plan of care cert/re-cert      G-Codes - 01/27/17 1640    Functional Assessment Tool Used BERG: 38/56; gait speed no AD: 1.34ft/sec; TUG without AD: 17.5 sec.    Functional Limitation Mobility: Walking and moving around   Mobility: Walking and Moving Around Current Status [phone]) At least 40 percent but less than 60 percent impaired, limited or restricted   Mobility: Walking and Moving Around Goal Status [phone]) At least 1 percent but less than 20 percent impaired, limited or restricted       Problem List Patient Active Problem List   Diagnosis Date Noted  . Gait disorder 12/23/2016  . Dyslipidemia 06/17/2016  . Left hand weakness   . Ataxia   . Cerebral infarction (HCC) 11/09/2015  . Memory deficit     Rawn Quiroa L Jan 27, 2017, 4:42 PM  [facility] [address] [place], [place], [address] Phone: [phone]   Fax:  [phone]  Name: DORALEE KOCAK MRN: [id number] Date of Birth: November 10, 1920  Zerita Boers, PT,DPT 27-Jan-2017 4:43 PM Phone: [phone] Fax: [phone]

## 2017-01-14 ENCOUNTER — Ambulatory Visit: Payer: Medicare Other | Admitting: Physical Therapy

## 2017-01-16 ENCOUNTER — Ambulatory Visit: Payer: Medicare Other

## 2017-01-16 DIAGNOSIS — R293 Abnormal posture: Secondary | ICD-10-CM

## 2017-01-16 DIAGNOSIS — M6281 Muscle weakness (generalized): Secondary | ICD-10-CM

## 2017-01-16 DIAGNOSIS — R2689 Other abnormalities of gait and mobility: Secondary | ICD-10-CM

## 2017-01-16 NOTE — Patient Instructions (Signed)
Fall Prevention in the Home Falls can cause injuries and can affect people from all age groups. There are many simple things that you can do to make your home safe and to help prevent falls. What can I do on the outside of my home?  Regularly repair the edges of walkways and driveways and fix any cracks.  Remove high doorway thresholds.  Trim any shrubbery on the main path into your home.  Use bright outdoor lighting.  Clear walkways of debris and clutter, including tools and rocks.  Regularly check that handrails are securely fastened and in good repair. Both sides of any steps should have handrails.  Install guardrails along the edges of any raised decks or porches.  Have leaves, snow, and ice cleared regularly.  Use sand or salt on walkways during winter months.  In the garage, clean up any spills right away, including grease or oil spills. What can I do in the bathroom?  Use night lights.  Install grab bars by the toilet and in the tub and shower. Do not use towel bars as grab bars.  Use non-skid mats or decals on the floor of the tub or shower.  If you need to sit down while you are in the shower, use a plastic, non-slip stool.  Keep the floor dry. Immediately clean up any water that spills on the floor.  Remove soap buildup in the tub or shower on a regular basis.  Attach bath mats securely with double-sided non-slip rug tape.  Remove throw rugs and other tripping hazards from the floor. What can I do in the bedroom?  Use night lights.  Make sure that a bedside light is easy to reach.  Do not use oversized bedding that drapes onto the floor.  Have a firm chair that has side arms to use for getting dressed.  Remove throw rugs and other tripping hazards from the floor. What can I do in the kitchen?  Clean up any spills right away.  Avoid walking on wet floors.  Place frequently used items in easy-to-reach places.  If you need to reach for something above  you, use a sturdy step stool that has a grab bar.  Keep electrical cables out of the way.  Do not use floor polish or wax that makes floors slippery. If you have to use wax, make sure that it is non-skid floor wax.  Remove throw rugs and other tripping hazards from the floor. What can I do in the stairways?  Do not leave any items on the stairs.  Make sure that there are handrails on both sides of the stairs. Fix handrails that are broken or loose. Make sure that handrails are as long as the stairways.  Check any carpeting to make sure that it is firmly attached to the stairs. Fix any carpet that is loose or worn.  Avoid having throw rugs at the top or bottom of stairways, or secure the rugs with carpet tape to prevent them from moving.  Make sure that you have a light switch at the top of the stairs and the bottom of the stairs. If you do not have them, have them installed. What are some other fall prevention tips?  Wear closed-toe shoes that fit well and support your feet. Wear shoes that have rubber soles or low heels.  When you use a stepladder, make sure that it is completely opened and that the sides are firmly locked. Have someone hold the ladder while you are using   it. Do not climb a closed stepladder.  Add color or contrast paint or tape to grab bars and handrails in your home. Place contrasting color strips on the first and last steps.  Use mobility aids as needed, such as canes, walkers, scooters, and crutches.  Turn on lights if it is dark. Replace any light bulbs that burn out.  Set up furniture so that there are clear paths. Keep the furniture in the same spot.  Fix any uneven floor surfaces.  Choose a carpet design that does not hide the edge of steps of a stairway.  Be aware of any and all pets.  Review your medicines with your healthcare provider. Some medicines can cause dizziness or changes in blood pressure, which increase your risk of falling. Talk with  your health care provider about other ways that you can decrease your risk of falls. This may include working with a physical therapist or trainer to improve your strength, balance, and endurance. This information is not intended to replace advice given to you by your health care provider. Make sure you discuss any questions you have with your health care provider. Document Released: 11/07/2002 Document Revised: 04/15/2016 Document Reviewed: 12/22/2014 Elsevier Interactive Patient Education  2017 Elsevier Inc.  

## 2017-01-16 NOTE — Therapy (Signed)
[facility] [address] [place], [place], [address] Phone: [phone]   Fax:  [phone]  Physical Therapy Treatment  Patient Details  Name: Madison Maxwell MRN: [id number] Date of Birth: Nov 10, 1920 Referring Provider: Dr. Waynard Edwards  Encounter Date: 01/16/2017      PT End of Session - 01/16/17 1143    Visit Number 2   Number of Visits 17   Date for PT Re-Evaluation 03/14/17   Authorization Type G-CODE AND PROGRESS NOTE EVERY 10TH VISIT.   PT Start Time [phone]   PT Stop Time 1013   PT Time Calculation (min) 40 min   Equipment Utilized During Treatment --  min guard to S   Activity Tolerance Patient tolerated treatment well   Behavior During Therapy [facility] for tasks assessed/performed      Past Medical History:  Diagnosis Date  . Allergic rhinitis   . Anxiety   . Duodenal ulcer   . External hemorrhoid   . Hyperlipidemia   . Memory deficit   . Osteoarthritis   . Osteoporosis   . SUI (stress urinary incontinence), female   . Vitamin D deficiency     Past Surgical History:  Procedure Laterality Date  . APPENDECTOMY    . VAGINAL HYSTERECTOMY      There were no vitals filed for this visit.      Subjective Assessment - 01/16/17 0937    Subjective Pt denied falls or changes since last visit.    Patient is accompained by: Family member  Mahlon-husband   Pertinent History Hx of 11/2015 of R CVA, memory issues, sleeping issues, osteoporosis, SUI, anxiety, hyperlipidemia, mild OA, hx of cataracts, HOH, DJD of Lx spine   Patient Stated Goals "Strengthen my legs and help with bed txfs and mobility, as well as balance"   Currently in Pain? No/denies                Vestibular Assessment - 01/16/17 0953      Orthostatics   BP supine (x 5 minutes) 134/64   HR supine (x 5 minutes) 68   BP sitting 132/60   HR sitting 71   BP standing (after 1 minute) 125/60   HR standing (after 1 minute) 73   Orthostatics Comment No reports of lightheadedness, but PT assessed orthostatics as pt reported intermittent lightheadedness when getting OOB in the morning.                 [facility] Adult PT Treatment/Exercise - 01/16/17 0953      Bed Mobility   Bed Mobility Sit to Sidelying Right;Right Sidelying to Sit   Right Sidelying to Sit 4: Min guard;5: Supervision;HOB flat   Right Sidelying to Sit Details (indicate cue type and reason) Cues for technique   Sit to Sidelying Right 5: Supervision;4: Min guard;HOB flat   Sit to Sidelying Right Details (indicate cue type and reason) Cues for technique and how to txf to reduce LBP. Performed all x7 reps.              Balance Exercises - 01/16/17 1016      OTAGO PROGRAM   Ankle Plantorflexors 20 reps, support   Ankle Dorsiflexors 20 reps, support   Backwards Walking Support   Sit to Stand 10 reps, no support   Overall OTAGO Comments Cues for technique and S for safety.      Self Care:     PT Education - 01/16/17 1015    Education provided Yes  Education Details PT provided pt with falls prevention handout and educated pt on proper bed mobility (sidelying<>sit txfs) to reduce LBP. PT asked pt to bring OTAGO book to next session.    Person(s) Educated Patient;Spouse   Methods Explanation;Demonstration;Verbal cues;Tactile cues;Handout   Comprehension Verbalized understanding;Returned demonstration;Need further instruction          PT Short Term Goals - 01/13/17 1636      PT SHORT TERM GOAL #1   Title Pt will be IND with HEP to improve balance, strength, LBP and endurance. TARGET DATE FOR ALL STGS: 02/10/17   Status New     PT SHORT TERM GOAL #2   Title Pt will improve BERG score to >/=42/56 to decr. falls risk.    Status New     PT SHORT TERM GOAL #3   Title Pt will amb. 500' over even terrain, IND, to improve functional mobilty.    Status New     PT SHORT TERM GOAL #4   Title Pt will perform TUG without AD i  n</=13.5sec. to decr. falls risk.   Status New           PT Long Term Goals - 01/13/17 1638      PT LONG TERM GOAL #1   Title Pt will verbalize understanding of fall risk prevention strategies to decr. falls risk. TARGET DATE FOR ALL LTGS: 03/10/17   Status New     PT LONG TERM GOAL #2   Title Pt will improve BERG score to >/=46/56 to decr. falls risk.    Status New     PT LONG TERM GOAL #3   Title Pt will amb. 700' over even/uneven terrain with SPC, at MOD I level to decr. falls risk.    Status New     PT LONG TERM GOAL #4   Title Pt will improve gait speed to >/=2.7262ft/sec. with LRAD to safely ambulate in the community.    Status New               Plan - 01/16/17 1143    Clinical Impression Statement Today's session focused on bed mobility and balance activities. PT educated pt on proper technique to txf in/out of bed to decr. LBP. PT also assessed for orthostatics, as pt reported intermittent lightheadedness in the morning, but was negative for orthostatics. PT did educate pt on the importance of waiting to stand/walk until dizziness subsides. Pt would continue to benefit from PT to improve safety during functional mobility.    Rehab Potential Good   Clinical Impairments Affecting Rehab Potential see PMH   PT Frequency 2x / week   PT Duration 8 weeks   PT Treatment/Interventions ADLs/Self Care Home Management;Biofeedback;Canalith Repostioning;Electrical Stimulation;Neuromuscular re-education;Balance training;Therapeutic exercise;Therapeutic activities;Functional mobility training;Stair training;Gait training;Orthotic Fit/Training;DME Instruction;Patient/family education;Vestibular   PT Next Visit Plan Add/modify OTAGO HEP.   Consulted and Agree with Plan of Care Patient      Patient will benefit from skilled therapeutic intervention in order to improve the following deficits and impairments:  Abnormal gait, Decreased endurance, Decreased mobility, Decreased balance,  Decreased coordination, Decreased cognition, Pain, Impaired flexibility, Postural dysfunction, Decreased strength  Visit Diagnosis: Other abnormalities of gait and mobility  Muscle weakness (generalized)  Abnormal posture     Problem List Patient Active Problem List   Diagnosis Date Noted  . Gait disorder 12/23/2016  . Dyslipidemia 06/17/2016  . Left hand weakness   . Ataxia   . Cerebral infarction (HCC) 11/09/2015  . Memory deficit  Caelen Reierson L 01/16/2017, 11:46 AM  [facility] [address] [place], [place], [address] Phone: [phone]   Fax:  [phone]  Name: MARIGOLD MOM MRN: [id number] Date of Birth: 01-28-1920  Zerita Boers, PT,DPT 01/16/17 11:47 AM Phone: [phone] Fax: [phone]

## 2017-01-20 ENCOUNTER — Ambulatory Visit: Payer: Medicare Other

## 2017-01-20 DIAGNOSIS — M6281 Muscle weakness (generalized): Secondary | ICD-10-CM

## 2017-01-20 DIAGNOSIS — R2689 Other abnormalities of gait and mobility: Secondary | ICD-10-CM

## 2017-01-20 DIAGNOSIS — R293 Abnormal posture: Secondary | ICD-10-CM

## 2017-01-20 NOTE — Therapy (Signed)
[facility] [address] [place], [place], [address] Phone: [phone]   Fax:  [phone]  Physical Therapy Treatment  Patient Details  Name: Madison Maxwell MRN: [id number] Date of Birth: Nov 18, 1920 Referring Provider: Dr. Waynard Edwards  Encounter Date: 01/20/2017      PT End of Session - 01/20/17 0934    Visit Number 3   Number of Visits 17   Date for PT Re-Evaluation 03/14/17   Authorization Type G-CODE AND PROGRESS NOTE EVERY 10TH VISIT.   PT Start Time 0845   PT Stop Time 0928   PT Time Calculation (min) 43 min   Equipment Utilized During Treatment --  min guard-min A and S as indicated, standing HEP performed in // bars.   Activity Tolerance Patient tolerated treatment well   Behavior During Therapy [facility] for tasks assessed/performed      Past Medical History:  Diagnosis Date  . Allergic rhinitis   . Anxiety   . Duodenal ulcer   . External hemorrhoid   . Hyperlipidemia   . Memory deficit   . Osteoarthritis   . Osteoporosis   . SUI (stress urinary incontinence), female   . Vitamin D deficiency     Past Surgical History:  Procedure Laterality Date  . APPENDECTOMY    . VAGINAL HYSTERECTOMY      There were no vitals filed for this visit.      Subjective Assessment - 01/20/17 0849    Subjective Pt reported she had nightmares last night, so she didn't sleep very well. Pt denied falls since last visit.    Patient is accompained by: Family member  Mahlon-husband   Pertinent History Hx of 11/2015 of R CVA, memory issues, sleeping issues, osteoporosis, SUI, anxiety, hyperlipidemia, mild OA, hx of cataracts, HOH, DJD of Lx spine   Patient Stated Goals "Strengthen my legs and help with bed txfs and mobility, as well as balance"   Currently in Pain? No/denies                              Balance Exercises - 01/20/17 0929      OTAGO PROGRAM   Head Movements Standing;5 reps   Neck  Movements Standing;5 reps   Back Extension Standing;5 reps   Trunk Movements Standing;5 reps   Ankle Movements Sitting;10 reps   Knee Extensor 10 reps  with red theraband   Knee Flexor 10 reps  with UE support   Hip ABductor 10 reps  with UE support   Ankle Plantorflexors 20 reps, support   Ankle Dorsiflexors 20 reps, support   Knee Bends 10 reps, support   Backwards Walking --  performed last session   Walking and Turning Around --  n/a   Sideways Walking Assistive device  in // bars   Tandem Stance 10 seconds, support   Tandem Walk Support   One Leg Stand 10 seconds, support   Heel Walking Support  pt required min guard to min A, PT discontinued in booklet   Toe Walk Support  PT discontinued in booklet-min guard to min A required   Heel Toe Walking Backward --  n/a   Sit to Stand --  performed last session   Stair Walking n/a   Overall OTAGO Comments Cues for technique and safety.            PT Education - 01/20/17 0934    Education provided Yes   Education Details  PT reviewed and modified previous OTAGO HEP as indicated.    Person(s) Educated Patient;Spouse   Methods Explanation;Demonstration;Tactile cues;Verbal cues;Handout   Comprehension Returned demonstration;Verbalized understanding;Need further instruction          PT Short Term Goals - 01/13/17 1636      PT SHORT TERM GOAL #1   Title Pt will be IND with HEP to improve balance, strength, LBP and endurance. TARGET DATE FOR ALL STGS: 02/10/17   Status New     PT SHORT TERM GOAL #2   Title Pt will improve BERG score to >/=42/56 to decr. falls risk.    Status New     PT SHORT TERM GOAL #3   Title Pt will amb. 500' over even terrain, IND, to improve functional mobilty.    Status New     PT SHORT TERM GOAL #4   Title Pt will perform TUG without AD i n</=13.5sec. to decr. falls risk.   Status New           PT Long Term Goals - 01/13/17 1638      PT LONG TERM GOAL #1   Title Pt will verbalize  understanding of fall risk prevention strategies to decr. falls risk. TARGET DATE FOR ALL LTGS: 03/10/17   Status New     PT LONG TERM GOAL #2   Title Pt will improve BERG score to >/=46/56 to decr. falls risk.    Status New     PT LONG TERM GOAL #3   Title Pt will amb. 700' over even/uneven terrain with SPC, at MOD I level to decr. falls risk.    Status New     PT LONG TERM GOAL #4   Title Pt will improve gait speed to >/=2.3162ft/sec. with LRAD to safely ambulate in the community.    Status New               Plan - 01/20/17 0935    Clinical Impression Statement Today's skilled session focused on modifying OTAGO to improve pt's balance, AROM, and strengthening. Pt demonstrated LOB during heel/toe walking, therefore, PT discontinued heel/toe walking HEP and will trial in PT session as tolerated. Pt demonstrated improved endurance, as she did not require seated rest breaks during session. Continue with POC.    Rehab Potential Good   Clinical Impairments Affecting Rehab Potential see PMH   PT Frequency 2x / week   PT Duration 8 weeks   PT Treatment/Interventions ADLs/Self Care Home Management;Biofeedback;Canalith Repostioning;Electrical Stimulation;Neuromuscular re-education;Balance training;Therapeutic exercise;Therapeutic activities;Functional mobility training;Stair training;Gait training;Orthotic Fit/Training;DME Instruction;Patient/family education;Vestibular   PT Next Visit Plan Dynamic/gait activities over even and compliant surfaces as tolerated.    PT Home Exercise Plan Updated OTAGO HEP   Consulted and Agree with Plan of Care Patient      Patient will benefit from skilled therapeutic intervention in order to improve the following deficits and impairments:  Abnormal gait, Decreased endurance, Decreased mobility, Decreased balance, Decreased coordination, Decreased cognition, Pain, Impaired flexibility, Postural dysfunction, Decreased strength  Visit Diagnosis: Other  abnormalities of gait and mobility  Muscle weakness (generalized)  Abnormal posture     Problem List Patient Active Problem List   Diagnosis Date Noted  . Gait disorder 12/23/2016  . Dyslipidemia 06/17/2016  . Left hand weakness   . Ataxia   . Cerebral infarction (HCC) 11/09/2015  . Memory deficit     Jacqlyn Marolf L 01/20/2017, 9:39 AM  [facility] [address] [place], [place], [address] Phone:  [phone]   Fax:  [phone]  Name: Madison Maxwell MRN: [id number] Date of Birth: 1919-12-27  Zerita Boers, PT,DPT 01/20/17 9:39 AM Phone: [phone] Fax: [phone]

## 2017-01-22 ENCOUNTER — Ambulatory Visit: Payer: Medicare Other

## 2017-01-22 DIAGNOSIS — M6281 Muscle weakness (generalized): Secondary | ICD-10-CM

## 2017-01-22 DIAGNOSIS — R2689 Other abnormalities of gait and mobility: Secondary | ICD-10-CM | POA: Diagnosis not present

## 2017-01-22 NOTE — Therapy (Signed)
[facility] [address] [place], [place], [address] Phone: [phone]   Fax:  [phone]  Physical Therapy Treatment  Patient Details  Name: Madison Maxwell MRN: [id number] Date of Birth: 1920/11/17 Referring Provider: Dr. Waynard Edwards  Encounter Date: 01/22/2017      PT End of Session - 01/22/17 1001    Visit Number 4   Number of Visits 17   Date for PT Re-Evaluation 03/14/17   Authorization Type G-CODE AND PROGRESS NOTE EVERY 10TH VISIT.   PT Start Time [phone]   PT Stop Time 0930   PT Time Calculation (min) 39 min   Equipment Utilized During Treatment Gait belt   Activity Tolerance Patient tolerated treatment well   Behavior During Therapy WFL for tasks assessed/performed      Past Medical History:  Diagnosis Date  . Allergic rhinitis   . Anxiety   . Duodenal ulcer   . External hemorrhoid   . Hyperlipidemia   . Memory deficit   . Osteoarthritis   . Osteoporosis   . SUI (stress urinary incontinence), female   . Vitamin D deficiency     Past Surgical History:  Procedure Laterality Date  . APPENDECTOMY    . VAGINAL HYSTERECTOMY      There were no vitals filed for this visit.      Subjective Assessment - 01/22/17 0855    Subjective Pt denied falls or changes since last visit.    Patient is accompained by: Family member  Mahlon-husband   Pertinent History Hx of 11/2015 of R CVA, memory issues, sleeping issues, osteoporosis, SUI, anxiety, hyperlipidemia, mild OA, hx of cataracts, HOH, DJD of Lx spine   Patient Stated Goals "Strengthen my legs and help with bed txfs and mobility, as well as balance"   Currently in Pain? Yes   Pain Score 1    Pain Location Back   Pain Orientation Lower   Pain Descriptors / Indicators Aching   Pain Type Chronic pain   Pain Onset More than a month ago   Pain Frequency Intermittent   Aggravating Factors  none that she's aware of   Pain Relieving Factors Tylenol                          [facility] Adult PT Treatment/Exercise - 01/22/17 0857      Bed Mobility   Bed Mobility Sit to Sidelying Right;Right Sidelying to Sit   Right Sidelying to Sit 4: Min guard;5: Supervision;5: Set up;HOB flat   Right Sidelying to Sit Details (indicate cue type and reason) Cues and demo for technique. Performed x4 reps   Sit to Sidelying Right 4: Min guard;5: Set up;5: Supervision;HOB flat   Sit to Sidelying Right Details (indicate cue type and reason) Cues and demo for technique. x4 reps.     High Level Balance   High Level Balance Activities Side stepping;Braiding;Backward walking;Head turns;Tandem walking;Marching forwards;Negotiating over obstacles   High Level Balance Comments Performed in // bars with min guard to S for safety. Cues and demo for technique.Pt performed 4-6 repsx10'/activity. Min A required during no UE support due to LOB 2/2 narrow BOS and decr. lateral wt. shifting.                PT Education - 01/22/17 1000    Education provided Yes   Education Details PT reviewed proper bed mobility techniques to reduce LBP.   Person(s) Educated Patient;Spouse   Methods Explanation;Demonstration;Tactile cues;Verbal  cues;Handout   Comprehension Returned demonstration;Verbalized understanding;Need further instruction          PT Short Term Goals - 01/13/17 1636      PT SHORT TERM GOAL #1   Title Pt will be IND with HEP to improve balance, strength, LBP and endurance. TARGET DATE FOR ALL STGS: 02/10/17   Status New     PT SHORT TERM GOAL #2   Title Pt will improve BERG score to >/=42/56 to decr. falls risk.    Status New     PT SHORT TERM GOAL #3   Title Pt will amb. 500' over even terrain, IND, to improve functional mobilty.    Status New     PT SHORT TERM GOAL #4   Title Pt will perform TUG without AD i n</=13.5sec. to decr. falls risk.   Status New           PT Long Term Goals - 01/13/17 1638      PT LONG TERM GOAL #1    Title Pt will verbalize understanding of fall risk prevention strategies to decr. falls risk. TARGET DATE FOR ALL LTGS: 03/10/17   Status New     PT LONG TERM GOAL #2   Title Pt will improve BERG score to >/=46/56 to decr. falls risk.    Status New     PT LONG TERM GOAL #3   Title Pt will amb. 700' over even/uneven terrain with SPC, at MOD I level to decr. falls risk.    Status New     PT LONG TERM GOAL #4   Title Pt will improve gait speed to >/=2.31ft/sec. with LRAD to safely ambulate in the community.    Status New               Plan - 01/22/17 1001    Clinical Impression Statement Pt was able to demonstrated proper bed mobility (Sidelying<>sit) txfs after 4 reps. Pt continues to experience incr. postural sway and LOB during activiites which require incr. vestibular input (head turns) and would continue to benefit from skilled PT to improve safety during functional mobility. Pt experiencing decr. memory and requires husband to help remind pt how to perorm HEP at home.    Rehab Potential Good   Clinical Impairments Affecting Rehab Potential see PMH   PT Frequency 2x / week   PT Duration 8 weeks   PT Treatment/Interventions ADLs/Self Care Home Management;Biofeedback;Canalith Repostioning;Electrical Stimulation;Neuromuscular re-education;Balance training;Therapeutic exercise;Therapeutic activities;Functional mobility training;Stair training;Gait training;Orthotic Fit/Training;DME Instruction;Patient/family education;Vestibular   PT Next Visit Plan Continue Dynamic/gait activities over even and compliant surfaces as tolerated.    PT Home Exercise Plan Updated OTAGO HEP   Consulted and Agree with Plan of Care Patient      Patient will benefit from skilled therapeutic intervention in order to improve the following deficits and impairments:  Abnormal gait, Decreased endurance, Decreased mobility, Decreased balance, Decreased coordination, Decreased cognition, Pain, Impaired  flexibility, Postural dysfunction, Decreased strength  Visit Diagnosis: Other abnormalities of gait and mobility  Muscle weakness (generalized)     Problem List Patient Active Problem List   Diagnosis Date Noted  . Gait disorder 12/23/2016  . Dyslipidemia 06/17/2016  . Left hand weakness   . Ataxia   . Cerebral infarction (HCC) 11/09/2015  . Memory deficit     Lasasha Brophy L 01/22/2017, 10:03 AM  [facility] [address] [place], [place], [address] Phone: [phone]   Fax:  [phone]  Name: Madison Maxwell  MRN: [id number] Date of Birth: 12-04-1919  Zerita BoersJennifer Mercy Malena, PT,DPT 01/22/17 10:03 AM Phone: [phone] Fax: [phone]

## 2017-01-26 ENCOUNTER — Ambulatory Visit: Payer: Medicare Other | Admitting: Physical Therapy

## 2017-01-26 DIAGNOSIS — R2681 Unsteadiness on feet: Secondary | ICD-10-CM

## 2017-01-26 DIAGNOSIS — R2689 Other abnormalities of gait and mobility: Secondary | ICD-10-CM

## 2017-01-26 NOTE — Therapy (Signed)
[facility] [address] [place], [place], [address] Phone: [phone]   Fax:  [phone]  Physical Therapy Treatment  Patient Details  Name: Madison Maxwell MRN: [id number] Date of Birth: Dec 05, 1919 Referring Provider: Dr. Waynard Edwards  Encounter Date: 01/26/2017      PT End of Session - 01/26/17 1440    Visit Number 5   Number of Visits 17   Date for PT Re-Evaluation 03/14/17   Authorization Type G-CODE AND PROGRESS NOTE EVERY 10TH VISIT.   PT Start Time [phone]   PT Stop Time 0932   PT Time Calculation (min) 45 min      Past Medical History:  Diagnosis Date  . Allergic rhinitis   . Anxiety   . Duodenal ulcer   . External hemorrhoid   . Hyperlipidemia   . Memory deficit   . Osteoarthritis   . Osteoporosis   . SUI (stress urinary incontinence), female   . Vitamin D deficiency     Past Surgical History:  Procedure Laterality Date  . APPENDECTOMY    . VAGINAL HYSTERECTOMY      There were no vitals filed for this visit.      Subjective Assessment - 01/26/17 1043    Subjective Pt states she uses the RW (aluminum) in the house and uses the cane when she goes out to mailbox   Patient is accompained by: Family member   Pertinent History Hx of 11/2015 of R CVA, memory issues, sleeping issues, osteoporosis, SUI, anxiety, hyperlipidemia, mild OA, hx of cataracts, HOH, DJD of Lx spine   Patient Stated Goals "Strengthen my legs and help with bed txfs and mobility, as well as balance"   Currently in Pain? No/denies                         [facility] Adult PT Treatment/Exercise - 01/26/17 0904      Transfers   Transfers Sit to Stand   Sit to Stand 4: Min guard   Stand to Sit 5: Supervision   Number of Reps 10 reps  5 reps on floor; 10 reps on AirEx   Transfer Cueing to scoot forward to edge of mat     Ambulation/Gait   Ambulation/Gait Yes   Ambulation/Gait Assistance 4: Min guard   Ambulation/Gait Assistance Details Lt foot slightly internally rotated   Ambulation Distance (Feet) 240 Feet   Assistive device None   Gait Pattern Step-through pattern   Ambulation Surface Level;Indoor     Exercises   Exercises Knee/Hip     Lumbar Exercises: Standing   Heel Raises 10 reps   Functional Squats 10 reps  standing on AirEx with CGA     Knee/Hip Exercises: Aerobic   Nustep Level 4 - LE's only x 4" for LE strengthening     Knee/Hip Exercises: Standing   Forward Step Up Both;1 set;10 reps;Hand Hold: 2;Step Height: 6"             Balance Exercises - 01/26/17 1648      Balance Exercises: Standing   Stepping Strategy Anterior;Posterior;10 reps  on incline   Sidestepping 2 reps   Other Standing Exercises Marching in place on incline 10 reps with min assist     Alternate tap ups to 6" step with UE support prn - with CGA        PT Short Term Goals - 01/13/17 1636      PT SHORT TERM GOAL #1  Title Pt will be IND with HEP to improve balance, strength, LBP and endurance. TARGET DATE FOR ALL STGS: 02/10/17   Status New     PT SHORT TERM GOAL #2   Title Pt will improve BERG score to >/=42/56 to decr. falls risk.    Status New     PT SHORT TERM GOAL #3   Title Pt will amb. 500' over even terrain, IND, to improve functional mobilty.    Status New     PT SHORT TERM GOAL #4   Title Pt will perform TUG without AD i n</=13.5sec. to decr. falls risk.   Status New           PT Long Term Goals - 01/13/17 1638      PT LONG TERM GOAL #1   Title Pt will verbalize understanding of fall risk prevention strategies to decr. falls risk. TARGET DATE FOR ALL LTGS: 03/10/17   Status New     PT LONG TERM GOAL #2   Title Pt will improve BERG score to >/=46/56 to decr. falls risk.    Status New     PT LONG TERM GOAL #3   Title Pt will amb. 700' over even/uneven terrain with SPC, at MOD I level to decr. falls risk.    Status New     PT LONG TERM GOAL #4   Title  Pt will improve gait speed to >/=2.2662ft/sec. with LRAD to safely ambulate in the community.    Status New               Plan - 01/26/17 1441    Clinical Impression Statement Pt has decreased standing balance on compliant surface, requiring min to mod assist for balance recovery; pt also has decreased SLS on RLE and on LLE, requiring UE support for safety   Rehab Potential Good   Clinical Impairments Affecting Rehab Potential see PMH   PT Frequency 2x / week   PT Duration 8 weeks   PT Treatment/Interventions ADLs/Self Care Home Management;Biofeedback;Canalith Repostioning;Electrical Stimulation;Neuromuscular re-education;Balance training;Therapeutic exercise;Therapeutic activities;Functional mobility training;Stair training;Gait training;Orthotic Fit/Training;DME Instruction;Patient/family education;Vestibular   PT Next Visit Plan Continue Dynamic/gait activities over even and compliant surfaces as tolerated.    PT Home Exercise Plan Updated OTAGO HEP   Consulted and Agree with Plan of Care Patient      Patient will benefit from skilled therapeutic intervention in order to improve the following deficits and impairments:  Abnormal gait, Decreased endurance, Decreased mobility, Decreased balance, Decreased coordination, Decreased cognition, Pain, Impaired flexibility, Postural dysfunction, Decreased strength  Visit Diagnosis: Other abnormalities of gait and mobility  Unsteadiness     Problem List Patient Active Problem List   Diagnosis Date Noted  . Gait disorder 12/23/2016  . Dyslipidemia 06/17/2016  . Left hand weakness   . Ataxia   . Cerebral infarction (HCC) 11/09/2015  . Memory deficit     [name], [name], PT 01/26/2017, 4:57 PM  [facility] [address] [place], [place], [address] Phone: [phone]   Fax:  [phone]  Name: Madison Maxwell MRN: [id number] Date of Birth: 08-30-1920

## 2017-01-27 ENCOUNTER — Ambulatory Visit: Payer: Medicare Other | Admitting: Physical Therapy

## 2017-01-27 DIAGNOSIS — R2681 Unsteadiness on feet: Secondary | ICD-10-CM

## 2017-01-27 DIAGNOSIS — R2689 Other abnormalities of gait and mobility: Secondary | ICD-10-CM

## 2017-01-27 DIAGNOSIS — M6281 Muscle weakness (generalized): Secondary | ICD-10-CM

## 2017-01-27 NOTE — Therapy (Signed)
[facility] [address] [place], [place], [address] Phone: [phone]   Fax:  [phone]  Physical Therapy Treatment  Patient Details  Name: Madison Maxwell MRN: [id number] Date of Birth: Sep 28, 1920 Referring Provider: Dr. Waynard EdwardsPerini  Encounter Date: 01/27/2017      PT End of Session - 01/27/17 2238    Visit Number 6   Number of Visits 17   Date for PT Re-Evaluation 03/14/17   Authorization Type G-CODE AND PROGRESS NOTE EVERY 10TH VISIT.   PT Start Time 1152   PT Stop Time 1236   PT Time Calculation (min) 44 min      Past Medical History:  Diagnosis Date  . Allergic rhinitis   . Anxiety   . Duodenal ulcer   . External hemorrhoid   . Hyperlipidemia   . Memory deficit   . Osteoarthritis   . Osteoporosis   . SUI (stress urinary incontinence), female   . Vitamin D deficiency     Past Surgical History:  Procedure Laterality Date  . APPENDECTOMY    . VAGINAL HYSTERECTOMY      There were no vitals filed for this visit.      Subjective Assessment - 01/27/17 2232    Subjective Pt states she forgot to bring her cane today - is holding husband's hand to assist her   Patient is accompained by: Family member   Pertinent History Hx of 11/2015 of R CVA, memory issues, sleeping issues, osteoporosis, SUI, anxiety, hyperlipidemia, mild OA, hx of cataracts, HOH, DJD of Lx spine   Patient Stated Goals "Strengthen my legs and help with bed txfs and mobility, as well as balance"   Currently in Pain? No/denies                         [facility] Adult PT Treatment/Exercise - 01/27/17 1219      Transfers   Transfers Sit to Stand   Sit to Stand 4: Min guard   Number of Reps Other reps (comment)  5 reps     Lumbar Exercises: Standing   Heel Raises 10 reps     Knee/Hip Exercises: Aerobic   Nustep Level 4 - LE's only x 4" for LE strengthening     Knee/Hip Exercises: Standing   Hip Flexion  Stengthening;Both;1 set;10 reps  2#   Hip Abduction Stengthening;Both;1 set;10 reps  2#   Hip Extension Stengthening;Both;1 set;10 reps  2#   Other Standing Knee Exercises marching with 2# weight on each leg 10 reps each             Balance Exercises - 01/27/17 2236      Balance Exercises: Standing   Rockerboard Anterior/posterior;30 seconds;UE support   Other Standing Exercises Stepping over and back of black balance beam with UE support 10 reps each     standing on blue balance beam for incr. Hip strategy - with UE support prn with CGA to min assist  sidestepping inside // bars 10' x 2 reps with UE support prn  Alternate tap ups to 6" step with min hand held assist      PT Short Term Goals - 01/13/17 1636      PT SHORT TERM GOAL #1   Title Pt will be IND with HEP to improve balance, strength, LBP and endurance. TARGET DATE FOR ALL STGS: 02/10/17   Status New     PT SHORT TERM GOAL #2   Title Pt will  improve BERG score to >/=42/56 to decr. falls risk.    Status New     PT SHORT TERM GOAL #3   Title Pt will amb. 500' over even terrain, IND, to improve functional mobilty.    Status New     PT SHORT TERM GOAL #4   Title Pt will perform TUG without AD i n</=13.5sec. to decr. falls risk.   Status New           PT Long Term Goals - 01/13/17 1638      PT LONG TERM GOAL #1   Title Pt will verbalize understanding of fall risk prevention strategies to decr. falls risk. TARGET DATE FOR ALL LTGS: 03/10/17   Status New     PT LONG TERM GOAL #2   Title Pt will improve BERG score to >/=46/56 to decr. falls risk.    Status New     PT LONG TERM GOAL #3   Title Pt will amb. 700' over even/uneven terrain with SPC, at MOD I level to decr. falls risk.    Status New     PT LONG TERM GOAL #4   Title Pt will improve gait speed to >/=2.49ft/sec. with LRAD to safely ambulate in the community.    Status New               Plan - 01/27/17 2238    Clinical Impression  Statement Pt progressing well towards goals; needs UE support for safety with standing balance exercises due to decr. SLS   Rehab Potential Good   Clinical Impairments Affecting Rehab Potential see PMH   PT Frequency 2x / week   PT Duration 8 weeks   PT Treatment/Interventions ADLs/Self Care Home Management;Biofeedback;Canalith Repostioning;Electrical Stimulation;Neuromuscular re-education;Balance training;Therapeutic exercise;Therapeutic activities;Functional mobility training;Stair training;Gait training;Orthotic Fit/Training;DME Instruction;Patient/family education;Vestibular   PT Next Visit Plan Continue Dynamic/gait activities over even and compliant surfaces as tolerated.    Consulted and Agree with Plan of Care Patient      Patient will benefit from skilled therapeutic intervention in order to improve the following deficits and impairments:  Abnormal gait, Decreased endurance, Decreased mobility, Decreased balance, Decreased coordination, Decreased cognition, Pain, Impaired flexibility, Postural dysfunction, Decreased strength  Visit Diagnosis: Other abnormalities of gait and mobility  Unsteadiness  Muscle weakness (generalized)     Problem List Patient Active Problem List   Diagnosis Date Noted  . Gait disorder 12/23/2016  . Dyslipidemia 06/17/2016  . Left hand weakness   . Ataxia   . Cerebral infarction (HCC) 11/09/2015  . Memory deficit     Kary Kos, PT 01/27/2017, 10:42 PM  [facility] [address] [place], [place], [address] Phone: [phone]   Fax:  [phone]  Name: Madison Maxwell MRN: [id number] Date of Birth: 11-15-1920

## 2017-02-02 ENCOUNTER — Ambulatory Visit: Payer: Medicare Other | Attending: Internal Medicine | Admitting: Physical Therapy

## 2017-02-02 DIAGNOSIS — R2689 Other abnormalities of gait and mobility: Secondary | ICD-10-CM | POA: Insufficient documentation

## 2017-02-02 DIAGNOSIS — R2681 Unsteadiness on feet: Secondary | ICD-10-CM

## 2017-02-02 DIAGNOSIS — M6281 Muscle weakness (generalized): Secondary | ICD-10-CM | POA: Diagnosis present

## 2017-02-02 NOTE — Therapy (Signed)
[facility] [address] [place], [place], [address] Phone: [phone]   Fax:  [phone]  Physical Therapy Treatment  Patient Details  Name: Madison Maxwell MRN: [id number] Date of Birth: 1920/08/30 Referring Provider: Dr. Waynard EdwardsPerini  Encounter Date: 02/02/2017      PT End of Session - 02/02/17 1207    Visit Number 7   Number of Visits 17   Date for PT Re-Evaluation 03/14/17   Authorization Type G-CODE AND PROGRESS NOTE EVERY 10TH VISIT.   PT Start Time 0845   PT Stop Time 0930   PT Time Calculation (min) 45 min   Equipment Utilized During Treatment Gait belt      Past Medical History:  Diagnosis Date  . Allergic rhinitis   . Anxiety   . Duodenal ulcer   . External hemorrhoid   . Hyperlipidemia   . Memory deficit   . Osteoarthritis   . Osteoporosis   . SUI (stress urinary incontinence), female   . Vitamin D deficiency     Past Surgical History:  Procedure Laterality Date  . APPENDECTOMY    . VAGINAL HYSTERECTOMY      There were no vitals filed for this visit.      Subjective Assessment - 02/02/17 1045    Subjective "slow today" - pt reports back was sore this am but she put heating pad on it and it feels better now - does not hurt at all now   Patient is accompained by: Family member   Pertinent History Hx of 11/2015 of R CVA, memory issues, sleeping issues, osteoporosis, SUI, anxiety, hyperlipidemia, mild OA, hx of cataracts, HOH, DJD of Lx spine   Patient Stated Goals "Strengthen my legs and help with bed txfs and mobility, as well as balance"   Currently in Pain? No/denies                         [facility] Treatment/Exercise - 02/02/17 1046      Transfers   Transfers Sit to Stand   Sit to Stand 5: Supervision   Number of Reps Other reps (comment)  5   Comments feet on floor; no UE support used     Ambulation/Gait   Ambulation/Gait Yes   Ambulation/Gait Assistance 4:  Min guard   Ambulation/Gait Assistance Details Pt tossed ball for reactive balance reactions, approx. 6950' with min guard; pr amb. 120' carrying wicker laundry basket with 1 towel in basket; with CGA but no LOB occurred   Ambulation Distance (Feet) 120 Feet  50   Assistive device None   Gait Pattern Step-through pattern   Ambulation Surface Level;Indoor   Stairs Yes   Stairs Assistance 5: Supervision   Stair Management Technique Two rails;Step to pattern   Number of Stairs 4   Height of Stairs 6     Lumbar Exercises: Standing   Heel Raises 10 reps   Functional Squats 10 reps  standing on blue mat - mat table behind      Knee/Hip Exercises: Aerobic   Nustep SciFit level 1.5 x 4" with UE's and LE's             Balance Exercises - 02/02/17 1159      Balance Exercises: Standing   Stepping Strategy Anterior;Posterior;5 reps  on incline with min assist   Rockerboard Anterior/posterior;30 seconds;UE support     NeuroRe-ed:  Pt performed marching on blue mat with CGA;  Performed forward,  back and side kicks on blue mat  10 reps each leg with CGA Pt performed alternate stepping forward/back incline 10 reps each with min hand held assist, then 10 reps on decline Down/back with min assist        PT Short Term Goals - 02/02/17 0905      PT SHORT TERM GOAL #1   Title Pt will be IND with HEP to improve balance, strength, LBP and endurance. TARGET DATE FOR ALL STGS: 02/10/17           PT Long Term Goals - 01/13/17 1638      PT LONG TERM GOAL #1   Title Pt will verbalize understanding of fall risk prevention strategies to decr. falls risk. TARGET DATE FOR ALL LTGS: 03/10/17   Status New     PT LONG TERM GOAL #2   Title Pt will improve BERG score to >/=46/56 to decr. falls risk.    Status New     PT LONG TERM GOAL #3   Title Pt will amb. 700' over even/uneven terrain with SPC, at MOD I level to decr. falls risk.    Status New     PT LONG TERM GOAL #4   Title Pt will  improve gait speed to >/=2.75ft/sec. with LRAD to safely ambulate in the community.    Status New               Plan - 02/02/17 1207    Clinical Impression Statement Pt progressing well with balance activities - continues to require UE support for safety with SLS activities and with standing on compliant surfaces   Rehab Potential Good   Clinical Impairments Affecting Rehab Potential see PMH   PT Frequency 2x / week   PT Duration 8 weeks   PT Treatment/Interventions ADLs/Self Care Home Management;Biofeedback;Canalith Repostioning;Electrical Stimulation;Neuromuscular re-education;Balance training;Therapeutic exercise;Therapeutic activities;Functional mobility training;Stair training;Gait training;Orthotic Fit/Training;DME Instruction;Patient/family education;Vestibular   PT Next Visit Plan Continue Dynamic/gait activities over even and compliant surfaces as tolerated.    PT Home Exercise Plan Updated [facility] HEP   Consulted and Agree with Plan of Care Patient      Patient will benefit from skilled therapeutic intervention in order to improve the following deficits and impairments:  Abnormal gait, Decreased endurance, Decreased mobility, Decreased balance, Decreased coordination, Decreased cognition, Pain, Impaired flexibility, Postural dysfunction, Decreased strength  Visit Diagnosis: Other abnormalities of gait and mobility  Unsteadiness     Problem List Patient Active Problem List   Diagnosis Date Noted  . Gait disorder 12/23/2016  . Dyslipidemia 06/17/2016  . Left hand weakness   . Ataxia   . Cerebral infarction (HCC) 11/09/2015  . Memory deficit     Kary Kos, PT 02/02/2017, 12:14 PM   [facility] [address] [place], [place], [address] Phone: [phone]   Fax:  [phone]  Name: Madison Maxwell MRN: [id number] Date of Birth: [birth year]

## 2017-02-05 ENCOUNTER — Ambulatory Visit: Payer: Medicare Other | Admitting: Physical Therapy

## 2017-02-05 DIAGNOSIS — R2689 Other abnormalities of gait and mobility: Secondary | ICD-10-CM

## 2017-02-05 DIAGNOSIS — M6281 Muscle weakness (generalized): Secondary | ICD-10-CM

## 2017-02-06 NOTE — Therapy (Signed)
[facility] [address] [place], [place], [address] Phone: [phone]   Fax:  [phone]  Physical Therapy Treatment  Patient Details  Name: Madison Maxwell C Fabel MRN: [id number] Date of Birth: [birth year] Referring Provider: Dr. Waynard EdwardsPerini  Encounter Date: 02/05/2017      PT End of Session - 02/06/17 0934    Visit Number 8   Number of Visits 17   Date for PT Re-Evaluation 03/14/17   Authorization Type G-CODE AND PROGRESS NOTE EVERY 10TH VISIT.   PT Start Time 0850   PT Stop Time 0933   PT Time Calculation (min) 43 min      Past Medical History:  Diagnosis Date  . Allergic rhinitis   . Anxiety   . Duodenal ulcer   . External hemorrhoid   . Hyperlipidemia   . Memory deficit   . Osteoarthritis   . Osteoporosis   . SUI (stress urinary incontinence), female   . Vitamin D deficiency     Past Surgical History:  Procedure Laterality Date  . APPENDECTOMY    . VAGINAL HYSTERECTOMY      There were no vitals filed for this visit.      Subjective Assessment - 02/06/17 0927    Subjective Husband reports that pt (spouse) is walking much better   Patient is accompained by: Family member   Pertinent History Hx of 11/2015 of R CVA, memory issues, sleeping issues, osteoporosis, SUI, anxiety, hyperlipidemia, mild OA, hx of cataracts, HOH, DJD of Lx spine   Patient Stated Goals "Strengthen my legs and help with bed txfs and mobility, as well as balance"                         [facility] Adult PT Treatment/Exercise - 02/06/17 0001      Transfers   Transfers Sit to Stand   Sit to Stand 5: Supervision   Number of Reps Other reps (comment)  5   Comments No UE support used  feet on blue balance faom beam for incr. challenge     Ambulation/Gait   Ambulation/Gait Yes   Ambulation/Gait Assistance 4: Min guard   Ambulation/Gait Assistance Details Pt kicked bean bags for improved dynamic standing balance - 30' x 2  reps with CGA   Ambulation Distance (Feet) 120 Feet   Assistive device None   Gait Pattern Step-through pattern   Ambulation Surface Level;Indoor     Lumbar Exercises: Standing   Heel Raises 10 reps     Knee/Hip Exercises: Aerobic   Nustep Level 4 x 4" with legs only     Knee/Hip Exercises: Standing   Hip Flexion Stengthening;Both;1 set;10 reps  2#   Hip Abduction Stengthening;Both;1 set;10 reps  2#   Hip Extension Stengthening;Both;1 set;10 reps  2#   Forward Step Up Both;1 set;10 reps;Hand Hold: 2      NeuroRe-ed:  Pt performed ladder -5 reps with min hand held assist to increase SLS and step length  Pt performed cone taps (6 cones used) with min hand held assist - cones on floor  Figure 8's around these 6 cones with CGA without use of cane to improve balance with turning - 1 mild LOB occurred with 2 reps performed  Marching on incline with CGA - EO with no head turns        PT Education - 02/06/17 0932    Education provided Yes   Education Details Discussed STG's to be checked next session -  informed pt and husband of 8 wk POC - pt states she may be ready for D/C next week as she is pleased with current functional level    Person(s) Educated Patient;Spouse   Methods Explanation;Demonstration   Comprehension Verbalized understanding          PT Short Term Goals - 02/02/17 0905      PT SHORT TERM GOAL #1   Title Pt will be IND with HEP to improve balance, strength, LBP and endurance. TARGET DATE FOR ALL STGS: 02/10/17           PT Long Term Goals - 01/13/17 1638      PT LONG TERM GOAL #1   Title Pt will verbalize understanding of fall risk prevention strategies to decr. falls risk. TARGET DATE FOR ALL LTGS: 03/10/17   Status New     PT LONG TERM GOAL #2   Title Pt will improve BERG score to >/=46/56 to decr. falls risk.    Status New     PT LONG TERM GOAL #3   Title Pt will amb. 700' over even/uneven terrain with SPC, at MOD I level to decr. falls  risk.    Status New     PT LONG TERM GOAL #4   Title Pt will improve gait speed to >/=2.32ft/sec. with LRAD to safely ambulate in the community.    Status New               Plan - 02/06/17 4098    Clinical Impression Statement Pt is progressing well towards goals.  Pt does demonstrate age-related balance deficits with higher level balance skills but performs well with only CGA for safety and min assist needed for recovery.  Pt reports she is pleased with progress and is planning on discharging nexst week.   Rehab Potential Good   Clinical Impairments Affecting Rehab Potential see PMH   PT Frequency 2x / week   PT Duration 8 weeks   PT Treatment/Interventions ADLs/Self Care Home Management;Biofeedback;Canalith Repostioning;Electrical Stimulation;Neuromuscular re-education;Balance training;Therapeutic exercise;Therapeutic activities;Functional mobility training;Stair training;Gait training;Orthotic Fit/Training;DME Instruction;Patient/family education;Vestibular   PT Next Visit Plan check STG   PT Home Exercise Plan Updated [facility] HEP   Consulted and Agree with Plan of Care Patient      Patient will benefit from skilled therapeutic intervention in order to improve the following deficits and impairments:  Abnormal gait, Decreased endurance, Decreased mobility, Decreased balance, Decreased coordination, Decreased cognition, Pain, Impaired flexibility, Postural dysfunction, Decreased strength  Visit Diagnosis: Other abnormalities of gait and mobility  Muscle weakness (generalized)     Problem List Patient Active Problem List   Diagnosis Date Noted  . Gait disorder 12/23/2016  . Dyslipidemia 06/17/2016  . Left hand weakness   . Ataxia   . Cerebral infarction (HCC) 11/09/2015  . Memory deficit     Kary Kos, PT 02/06/2017, 9:38 AM  [facility] [address] [place], [place], [address] Phone:  [phone]   Fax:  [phone]  Name: DAVY FAUGHT MRN: [id number] Date of Birth: [birth year]

## 2017-02-09 ENCOUNTER — Ambulatory Visit: Payer: Medicare Other | Admitting: Physical Therapy

## 2017-02-09 DIAGNOSIS — R2689 Other abnormalities of gait and mobility: Secondary | ICD-10-CM

## 2017-02-09 NOTE — Therapy (Signed)
[facility] [address] [place], [place], [address] Phone: [phone]   Fax:  [phone]  Physical Therapy Treatment  Patient Details  Name: Madison Maxwell MRN: [id number] Date of Birth: 20-Jan-1920 Referring Provider: Dr. Joylene Draft  Encounter Date: 02/09/2017      PT End of Session - 02/09/17 1502    Visit Number 9   Number of Visits 17   Date for PT Re-Evaluation 03/14/17   Authorization Type G-CODE AND PROGRESS NOTE EVERY 10TH VISIT.   PT Start Time [phone]   PT Stop Time 1015   PT Time Calculation (min) 46 min      Past Medical History:  Diagnosis Date  . Allergic rhinitis   . Anxiety   . Duodenal ulcer   . External hemorrhoid   . Hyperlipidemia   . Memory deficit   . Osteoarthritis   . Osteoporosis   . SUI (stress urinary incontinence), female   . Vitamin D deficiency     Past Surgical History:  Procedure Laterality Date  . APPENDECTOMY    . VAGINAL HYSTERECTOMY      There were no vitals filed for this visit.      Subjective Assessment - 02/09/17 0933    Subjective Pt reports she is planning on finishing up PT this week - is pleased with how she is doing   Patient is accompained by: Family member   Pertinent History Hx of 11/2015 of R CVA, memory issues, sleeping issues, osteoporosis, SUI, anxiety, hyperlipidemia, mild OA, hx of cataracts, HOH, DJD of Lx spine   Patient Stated Goals "Strengthen my legs and help with bed txfs and mobility, as well as balance"   Currently in Pain? No/denies            [facility] PT Assessment - 02/09/17 0942      Berg Balance Test   Sit to Stand Able to stand without using hands and stabilize independently   Standing Unsupported Able to stand safely 2 minutes   Sitting with Back Unsupported but Feet Supported on Floor or Stool Able to sit safely and securely 2 minutes   Stand to Sit Sits safely with minimal use of hands   Transfers Able to transfer safely, minor  use of hands   Standing Unsupported with Eyes Closed Able to stand 10 seconds safely   Standing Ubsupported with Feet Together Able to place feet together independently and stand 1 minute safely   From Standing, Reach Forward with Outstretched Arm Can reach confidently >25 cm (10")   From Standing Position, Pick up Object from Floor Able to pick up shoe, needs supervision   From Standing Position, Turn to Look Behind Over each Shoulder Looks behind from both sides and weight shifts well   Turn 360 Degrees Able to turn 360 degrees safely but slowly  4.32 to L:   R= 4.97   Standing Unsupported, Alternately Place Feet on Step/Stool Able to complete >2 steps/needs minimal assist   Standing Unsupported, One Foot in Front Able to take small step independently and hold 30 seconds   Standing on One Leg Tries to lift leg/unable to hold 3 seconds but remains standing independently   Total Score 45          NeuroRe-ed;  Pt performed tandem stance (partial) both positions - 30 sec hold with UE support prn  SLS on RLE and on LLE 10 sec hold with UE support  Instructed pt in tap up exercise to perform  at home with UE support on rail prn  For improved SLS           [facility] Adult PT Treatment/Exercise - 02/09/17 1458      Ambulation/Gait   Gait velocity 15.69 = 2.09 ft/sec   Stairs Yes   Stairs Assistance 5: Supervision   Stairs Assistance Details (indicate cue type and reason) step over step   Stair Management Technique Two rails   Number of Stairs 4   Height of Stairs 6   Ramp 5: Supervision   Ramp Details (indicate cue type and reason) CGA   Curb 4: Min assist     SciFit level 1.5 x 3" with UE's and LE's             PT Short Term Goals - 02/09/17 1503      PT SHORT TERM GOAL #1   Title Pt will be IND with HEP to improve balance, strength, LBP and endurance. TARGET DATE FOR ALL STGS: 02/10/17   Baseline met 02-09-17   Status Achieved     PT SHORT TERM GOAL #2   Title Pt  will improve BERG score to >/=42/56 to decr. falls risk.    Baseline 45/56 on 02-09-17   Status Achieved     PT SHORT TERM GOAL #3   Title Pt will amb. 500' over even terrain, IND, to improve functional mobilty.    Baseline due to rainy weather - 02-09-17   Status Unable to assess     PT SHORT TERM GOAL #4   Title Pt will perform TUG without AD i n</=13.5sec. to decr. falls risk.   Baseline 13.25 secs without cane   02-09-17   Status Achieved           PT Long Term Goals - 02/09/17 1510      PT LONG TERM GOAL #1   Title Pt will verbalize understanding of fall risk prevention strategies to decr. falls risk. TARGET DATE FOR ALL LTGS: 03/10/17   Baseline met 02-09-17   Status Achieved     PT LONG TERM GOAL #2   Title Pt will improve BERG score to >/=46/56 to decr. falls risk.    Baseline 45/56  - 02-09-17   Status New     PT LONG TERM GOAL #3   Title Pt will amb. 700' over even/uneven terrain with SPC, at MOD I level to decr. falls risk.      PT LONG TERM GOAL #4   Title Pt will improve gait speed to >/=2.27f/sec. with LRAD to safely ambulate in the community.    Baseline 17.65 secs, 15.69 secs = 2.09 ft/sec  02-09-17   Status Not Met               Plan - 02/09/17 1506    Clinical Impression Statement Pt has met STG's #1, 2 and 4:  #3 not met due to gait on uneven terrain unable to be assess due to rainy weather today;  Pt states she is pleased with progress - wants to D/C next visit    Rehab Potential Good   Clinical Impairments Affecting Rehab Potential see PMH   PT Frequency 2x / week   PT Duration 8 weeks   PT Treatment/Interventions ADLs/Self Care Home Management;Biofeedback;Canalith Repostioning;Electrical Stimulation;Neuromuscular re-education;Balance training;Therapeutic exercise;Therapeutic activities;Functional mobility training;Stair training;Gait training;Orthotic Fit/Training;DME Instruction;Patient/family education;Vestibular   PT Next Visit Plan D/C -  recheck gait velocity   PT Home Exercise Plan Updated [facility]TAG[facility] HEP   Consulted and Agree  with Plan of Care Patient      Patient will benefit from skilled therapeutic intervention in order to improve the following deficits and impairments:  Abnormal gait, Decreased endurance, Decreased mobility, Decreased balance, Decreased coordination, Decreased cognition, Pain, Impaired flexibility, Postural dysfunction, Decreased strength  Visit Diagnosis: Other abnormalities of gait and mobility     Problem List Patient Active Problem List   Diagnosis Date Noted  . Gait disorder 12/23/2016  . Dyslipidemia 06/17/2016  . Left hand weakness   . Ataxia   . Cerebral infarction ([place]) 11/09/2015  . Memory deficit     Alda Lea, PT 02/09/2017, 3:13 PM  [facility] [address] [place], [place], [address] Phone: [phone]   Fax:  [phone]  Name: ALEAH AHLGRIM MRN: [id number] Date of Birth: June 30, 1920

## 2017-02-12 ENCOUNTER — Ambulatory Visit: Payer: Medicare Other | Admitting: Physical Therapy

## 2017-02-12 DIAGNOSIS — M6281 Muscle weakness (generalized): Secondary | ICD-10-CM

## 2017-02-12 DIAGNOSIS — R2689 Other abnormalities of gait and mobility: Secondary | ICD-10-CM

## 2017-02-12 DIAGNOSIS — R2681 Unsteadiness on feet: Secondary | ICD-10-CM

## 2017-02-13 NOTE — Therapy (Signed)
[facility] [address] [place], [place], [address] Phone: [phone]   Fax:  [phone]  Physical Therapy Treatment/ D/C summary  Patient Details  Name: Madison Maxwell MRN: [id number] Date of Birth: [birth year] Referring Provider: Dr. Waynard Edwards  Encounter Date: 02/12/2017      PT End of Session - 02/13/17 1402    Visit Number 10   Number of Visits 17   Date for PT Re-Evaluation 03/14/17   PT Start Time 0931   PT Stop Time 1015   PT Time Calculation (min) 44 min   Equipment Utilized During Treatment Gait belt      Past Medical History:  Diagnosis Date  . Allergic rhinitis   . Anxiety   . Duodenal ulcer   . External hemorrhoid   . Hyperlipidemia   . Memory deficit   . Osteoarthritis   . Osteoporosis   . SUI (stress urinary incontinence), female   . Vitamin D deficiency     Past Surgical History:  Procedure Laterality Date  . APPENDECTOMY    . VAGINAL HYSTERECTOMY      There were no vitals filed for this visit.      Subjective Assessment - 02/13/17 1353    Subjective Pt reports she is ready for discharge - feels that she is doing well   Patient is accompained by: Family member   Pertinent History Hx of 11/2015 of R CVA, memory issues, sleeping issues, osteoporosis, SUI, anxiety, hyperlipidemia, mild OA, hx of cataracts, HOH, DJD of Lx spine   Patient Stated Goals "Strengthen my legs and help with bed txfs and mobility, as well as balance"   Currently in Pain? No/denies                         [facility] Adult PT Treatment/Exercise - 02/13/17 0001      Transfers   Transfers Sit to Stand   Sit to Stand 5: Supervision;4: Min guard   Stand to Sit 5: Supervision   Number of Reps 10 reps   Comments performed with feet on blue mat     Ambulation/Gait   Gait velocity 15.69 = 2.09 ft/sec     Knee/Hip Exercises: Standing   Hip Flexion Stengthening;Both;1 set;10 reps  2#   Hip Abduction  Stengthening;Both;1 set;10 reps  2#   Hip Extension Stengthening;Both;1 set;10 reps  2#   Forward Step Up Both;1 set;10 reps;Hand Hold: 2     Pt performed SciFit - level 1.5 x 5" with UE's and LE's        Balance Exercises - 02/13/17 1400      Balance Exercises: Standing   Standing Eyes Opened Wide (BOA);Foam/compliant surface;5 reps   Stepping Strategy Anterior;Posterior;5 reps  on incline with min assist     alternate tap ups to 6" step 10 reps each leg with UE support prn with CGA      PT Education - 02/13/17 1401    Education provided Yes   Education Details reviewed SLS exercise added to HEP - with UE support for safety   Person(s) Educated Patient;Spouse   Methods Explanation;Demonstration   Comprehension Verbalized understanding;Returned demonstration          PT Short Term Goals - 02/13/17 1356      PT SHORT TERM GOAL #1   Title Pt will be IND with HEP to improve balance, strength, LBP and endurance. TARGET DATE FOR ALL STGS: 02/10/17   Baseline met 02-09-17  Status Achieved     PT SHORT TERM GOAL #2   Title Pt will improve BERG score to >/=42/56 to decr. falls risk.    Baseline 45/56 on 02-09-17   Status Achieved     PT SHORT TERM GOAL #3   Title Pt will amb. 500' over even terrain, IND, to improve functional mobilty.    Baseline due to rainy weather - 02-09-17   Status Unable to assess     PT SHORT TERM GOAL #4   Title Pt will perform TUG without AD i n</=13.5sec. to decr. falls risk.   Baseline 13.25 secs without cane   02-09-17   Status Achieved           PT Long Term Goals - 02/13/17 1358      PT LONG TERM GOAL #1   Title Pt will verbalize understanding of fall risk prevention strategies to decr. falls risk. TARGET DATE FOR ALL LTGS: 03/10/17   Baseline met 02-09-17   Status Achieved     PT LONG TERM GOAL #2   Title Pt will improve BERG score to >/=46/56 to decr. falls risk.    Baseline 45/56  - 02-09-17   Status Not Met     PT LONG  TERM GOAL #3   Title Pt will amb. 700' over even/uneven terrain with SPC, at MOD I level to decr. falls risk.    Baseline pt declines amb. outdoors due to cold temperature this am -- 07-Mar-2017   Status Unable to assess     PT LONG TERM GOAL #4   Title Pt will improve gait speed to >/=2.65f/sec. with LRAD to safely ambulate in the community.    Baseline 17.65 secs, 15.69 secs = 2.09 ft/sec  02-09-17   Status Partially Met               Plan - 02/13/17 1402    Clinical Impression Statement Pt has met STG's #1,2 and 4:  pt has met LTG #1 and closely approximated LTG #2 of Berg score 46/56 with her score of 45/56;  pt declined amb. outside due to cold temperature; gait velocity not met   Rehab Potential Good   Clinical Impairments Affecting Rehab Potential see PMH   PT Frequency 2x / week   PT Duration 4 weeks   PT Treatment/Interventions ADLs/Self Care Home Management;Biofeedback;Canalith Repostioning;Electrical Stimulation;Neuromuscular re-education;Balance training;Therapeutic exercise;Therapeutic activities;Functional mobility training;Stair training;Gait training;Orthotic Fit/Training;DME Instruction;Patient/family education;Vestibular   PT Next Visit Plan D/C   PT Home Exercise Plan Updated OTAG[facility] HEP   Consulted and Agree with Plan of Care Patient;Family member/caregiver   Family Member Consulted husband      Patient will benefit from skilled therapeutic intervention in order to improve the following deficits and impairments:  Abnormal gait, Decreased endurance, Decreased mobility, Decreased balance, Decreased coordination, Decreased cognition, Pain, Impaired flexibility, Postural dysfunction, Decreased strength  Visit Diagnosis: Other abnormalities of gait and mobility  Unsteadiness  Muscle weakness (generalized)       G-Codes - 004/07/20181406    Functional Assessment Tool Used (Outpatient Only) Berg score 45/56:  gait velocity 2.09 ft/sec; TUG score 13.25 secs without  cane   Functional Limitation Mobility: Walking and moving around   Mobility: Walking and Moving Around Goal Status ([phone] At least 1 percent but less than 20 percent impaired, limited or restricted   Mobility: Walking and Moving Around Discharge Status [phone] At least 20 percent but less than 40 percent impaired, limited or restricted  Problem List Patient Active Problem List   Diagnosis Date Noted  . Gait disorder 12/23/2016  . Dyslipidemia 06/17/2016  . Left hand weakness   . Ataxia   . Cerebral infarction ([place]) 11/09/2015  . Memory deficit     PHYSICAL THERAPY DISCHARGE SUMMARY  Visits from Start of Care: 10  Current functional level related to goals / functional outcomes:  See above to progress toward goals - pt has met all STG's with STG #3 (amb. Outside) not tested due to pt's request due to cold weather  LTG #1 met; other LTG's not met but pt has improved with gait speed and balance scores since initial eval 4 weeks ago;  Pt requests to discharge early due to being pleased with current functional status   Remaining deficits: Decreased standing balance (age-related deficits) Pt uses a cane and husband's assistance for safety with community ambulation   Education / Equipment: Pt has been instructed in HEP for LE strengthening and balance exercises (OTAGA) Plan: Patient agrees to discharge.  Patient goals were partially met. Patient is being discharged due to being pleased with the current functional level.  ?????       ILOKPW, XGKMK TLZBCAF, PT 02/13/2017, 2:09 PM  [facility] [address] [place], [place], [address] Phone: [phone]   Fax:  [phone]  Name: Madison Maxwell MRN: [id number] Date of Birth: 08/16/1920

## 2017-04-24 ENCOUNTER — Other Ambulatory Visit (HOSPITAL_COMMUNITY): Payer: Self-pay | Admitting: *Deleted

## 2017-04-28 ENCOUNTER — Ambulatory Visit (HOSPITAL_COMMUNITY)
Admission: RE | Admit: 2017-04-28 | Discharge: 2017-04-28 | Disposition: A | Discharge status: Home / Self Care | Payer: Medicare Other | Source: Ambulatory Visit | Attending: Internal Medicine | Admitting: Internal Medicine

## 2017-04-28 DIAGNOSIS — M810 Age-related osteoporosis without current pathological fracture: Secondary | ICD-10-CM | POA: Insufficient documentation

## 2017-04-28 MED ORDER — DENOSUMAB 60 MG/ML ~~LOC~~ SOLN
60.0000 mg | Freq: Once | SUBCUTANEOUS | Status: AC
  Administered 2017-04-28: 60 mg via SUBCUTANEOUS
  Filled 2017-04-28: qty 1

## 2017-06-06 ENCOUNTER — Observation Stay (HOSPITAL_COMMUNITY)
Admission: EM | Admit: 2017-06-06 | Discharge: 2017-06-07 | Disposition: A | Discharge status: Home / Self Care | Payer: Medicare Other | Attending: Internal Medicine | Admitting: Internal Medicine

## 2017-06-06 ENCOUNTER — Encounter (HOSPITAL_COMMUNITY): Payer: Self-pay | Admitting: Emergency Medicine

## 2017-06-06 ENCOUNTER — Observation Stay (HOSPITAL_COMMUNITY): Payer: Medicare Other

## 2017-06-06 ENCOUNTER — Emergency Department (HOSPITAL_COMMUNITY): Payer: Medicare Other

## 2017-06-06 DIAGNOSIS — R002 Palpitations: Secondary | ICD-10-CM | POA: Diagnosis present

## 2017-06-06 DIAGNOSIS — Z79899 Other long term (current) drug therapy: Secondary | ICD-10-CM | POA: Diagnosis not present

## 2017-06-06 DIAGNOSIS — D696 Thrombocytopenia, unspecified: Secondary | ICD-10-CM | POA: Diagnosis present

## 2017-06-06 DIAGNOSIS — Z7982 Long term (current) use of aspirin: Secondary | ICD-10-CM | POA: Insufficient documentation

## 2017-06-06 DIAGNOSIS — Z7901 Long term (current) use of anticoagulants: Secondary | ICD-10-CM

## 2017-06-06 DIAGNOSIS — I119 Hypertensive heart disease without heart failure: Secondary | ICD-10-CM

## 2017-06-06 DIAGNOSIS — Z8673 Personal history of transient ischemic attack (TIA), and cerebral infarction without residual deficits: Secondary | ICD-10-CM | POA: Diagnosis not present

## 2017-06-06 DIAGNOSIS — D72819 Decreased white blood cell count, unspecified: Secondary | ICD-10-CM | POA: Diagnosis not present

## 2017-06-06 DIAGNOSIS — I4891 Unspecified atrial fibrillation: Secondary | ICD-10-CM | POA: Diagnosis not present

## 2017-06-06 DIAGNOSIS — R413 Other amnesia: Secondary | ICD-10-CM | POA: Diagnosis present

## 2017-06-06 DIAGNOSIS — I480 Paroxysmal atrial fibrillation: Secondary | ICD-10-CM | POA: Diagnosis present

## 2017-06-06 LAB — BASIC METABOLIC PANEL
ANION GAP: 7 (ref 5–15)
BUN: 15 mg/dL (ref 6–20)
CALCIUM: 9.4 mg/dL (ref 8.9–10.3)
CO2: 27 mmol/L (ref 22–32)
Chloride: 108 mmol/L (ref 101–111)
Creatinine, Ser: 0.59 mg/dL (ref 0.44–1.00)
GFR calc Af Amer: >60 mL/min (ref >60)
GFR calc non Af Amer: >60 mL/min (ref >60)
GLUCOSE: 117 mg/dL — AB (ref 65–99)
Potassium: 3.6 mmol/L (ref 3.5–5.1)
Sodium: 142 mmol/L (ref 135–145)

## 2017-06-06 LAB — URINALYSIS, ROUTINE W REFLEX MICROSCOPIC
Bilirubin Urine: NEGATIVE
GLUCOSE, UA: NEGATIVE mg/dL
HGB URINE DIPSTICK: NEGATIVE
Ketones, ur: NEGATIVE mg/dL
LEUKOCYTES UA: NEGATIVE
Nitrite: NEGATIVE
PROTEIN: NEGATIVE mg/dL
SPECIFIC GRAVITY, URINE: 1.006 (ref 1.005–1.030)
pH: 9 — ABNORMAL HIGH (ref 5.0–8.0)

## 2017-06-06 LAB — CBC WITH DIFFERENTIAL/PLATELET
BASOS ABS: 0 10*3/uL (ref 0.0–0.1)
Basophils Relative: 1 %
Eosinophils Absolute: 0.2 10*3/uL (ref 0.0–0.7)
Eosinophils Relative: 5 %
HEMATOCRIT: 41.9 % (ref 36.0–46.0)
Hemoglobin: 13.5 g/dL (ref 12.0–15.0)
LYMPHS ABS: 1.6 10*3/uL (ref 0.7–4.0)
LYMPHS PCT: 44 %
MCH: 31.1 pg (ref 26.0–34.0)
MCHC: 32.2 g/dL (ref 30.0–36.0)
MCV: 96.5 fL (ref 78.0–100.0)
MONO ABS: 0.2 10*3/uL (ref 0.1–1.0)
MONOS PCT: 6 %
Neutro Abs: 1.6 10*3/uL — ABNORMAL LOW (ref 1.7–7.7)
Neutrophils Relative %: 45 %
Platelets: 116 10*3/uL — ABNORMAL LOW (ref 150–400)
RBC: 4.34 MIL/uL (ref 3.87–5.11)
RDW: 12.7 % (ref 11.5–15.5)
WBC: 3.6 10*3/uL — ABNORMAL LOW (ref 4.0–10.5)

## 2017-06-06 LAB — IRON AND TIBC
IRON: 91 ug/dL (ref 28–170)
Saturation Ratios: 28 % (ref 10.4–31.8)
TIBC: 321 ug/dL (ref 250–450)
UIBC: 230 ug/dL

## 2017-06-06 LAB — RETICULOCYTES
RBC.: 4.35 MIL/uL (ref 3.87–5.11)
RETIC COUNT ABSOLUTE: 34.8 10*3/uL (ref 19.0–186.0)
Retic Ct Pct: 0.8 % (ref 0.4–3.1)

## 2017-06-06 LAB — FERRITIN: Ferritin: 89 ng/mL (ref 11–307)

## 2017-06-06 LAB — TROPONIN I: Troponin I: <0.03 ng/mL (ref <0.03)

## 2017-06-06 LAB — ECHOCARDIOGRAM COMPLETE
HEIGHTINCHES: 65 in
Weight: 1553.6 oz

## 2017-06-06 LAB — MRSA PCR SCREENING: MRSA by PCR: NEGATIVE

## 2017-06-06 LAB — MAGNESIUM: MAGNESIUM: 2.3 mg/dL (ref 1.7–2.4)

## 2017-06-06 LAB — PROTIME-INR
INR: 1.04
Prothrombin Time: 13.6 seconds (ref 11.4–15.2)

## 2017-06-06 LAB — VITAMIN B12: Vitamin B-12: 1056 pg/mL — ABNORMAL HIGH (ref 180–914)

## 2017-06-06 LAB — FOLATE

## 2017-06-06 LAB — TSH: TSH: 3.77 u[IU]/mL (ref 0.350–4.500)

## 2017-06-06 LAB — D-DIMER, QUANTITATIVE: D-Dimer, Quant: 0.57 ug/mL-FEU — ABNORMAL HIGH (ref 0.00–0.50)

## 2017-06-06 MED ORDER — VITAMIN D 1000 UNITS PO TABS
1000.0000 [IU] | ORAL_TABLET | Freq: Every day | ORAL | Status: DC
  Administered 2017-06-06 – 2017-06-07 (×2): 1000 [IU] via ORAL
  Filled 2017-06-06 (×2): qty 1

## 2017-06-06 MED ORDER — METOPROLOL TARTRATE 25 MG PO TABS
25.0000 mg | ORAL_TABLET | Freq: Two times a day (BID) | ORAL | Status: DC
  Administered 2017-06-06 – 2017-06-07 (×2): 25 mg via ORAL
  Filled 2017-06-06 (×2): qty 1

## 2017-06-06 MED ORDER — VITAMIN B-12 1000 MCG PO TABS
1000.0000 ug | ORAL_TABLET | Freq: Every day | ORAL | Status: DC
  Administered 2017-06-06 – 2017-06-07 (×2): 1000 ug via ORAL
  Filled 2017-06-06 (×2): qty 1

## 2017-06-06 MED ORDER — FAMOTIDINE 20 MG PO TABS
20.0000 mg | ORAL_TABLET | Freq: Every day | ORAL | Status: DC
  Administered 2017-06-06 – 2017-06-07 (×2): 20 mg via ORAL
  Filled 2017-06-06 (×2): qty 1

## 2017-06-06 MED ORDER — IOPAMIDOL (ISOVUE-370) INJECTION 76%
INTRAVENOUS | Status: AC
  Administered 2017-06-06: 80 mL
  Filled 2017-06-06: qty 100

## 2017-06-06 MED ORDER — RIVASTIGMINE 9.5 MG/24HR TD PT24
9.5000 mg | MEDICATED_PATCH | Freq: Every day | TRANSDERMAL | Status: DC
  Administered 2017-06-06 – 2017-06-07 (×2): 9.5 mg via TRANSDERMAL
  Filled 2017-06-06 (×2): qty 1

## 2017-06-06 MED ORDER — ACETAMINOPHEN 325 MG PO TABS: 650.0000 mg | ORAL_TABLET | ORAL | Status: DC | PRN

## 2017-06-06 MED ORDER — DILTIAZEM HCL 100 MG IV SOLR
5.0000 mg/h | INTRAVENOUS | Status: DC
  Administered 2017-06-06: 10 mg/h via INTRAVENOUS
  Filled 2017-06-06: qty 100

## 2017-06-06 MED ORDER — ZOLPIDEM TARTRATE 5 MG PO TABS
5.0000 mg | ORAL_TABLET | Freq: Every day | ORAL | Status: DC
  Administered 2017-06-06: 5 mg via ORAL
  Filled 2017-06-06: qty 1

## 2017-06-06 MED ORDER — VITAMIN C 500 MG PO TABS
500.0000 mg | ORAL_TABLET | Freq: Every day | ORAL | Status: DC
  Administered 2017-06-06 – 2017-06-07 (×2): 500 mg via ORAL
  Filled 2017-06-06 (×2): qty 1

## 2017-06-06 MED ORDER — DILTIAZEM HCL 100 MG IV SOLR
5.0000 mg/h | INTRAVENOUS | Status: DC
  Administered 2017-06-06: 5 mg/h via INTRAVENOUS
  Filled 2017-06-06: qty 100

## 2017-06-06 MED ORDER — ONDANSETRON HCL 4 MG/2ML IJ SOLN: 4.0000 mg | Freq: Four times a day (QID) | INTRAMUSCULAR | Status: DC | PRN

## 2017-06-06 MED ORDER — ATORVASTATIN CALCIUM 20 MG PO TABS
20.0000 mg | ORAL_TABLET | Freq: Every day | ORAL | Status: DC
  Administered 2017-06-06: 20 mg via ORAL
  Filled 2017-06-06: qty 1

## 2017-06-06 MED ORDER — DILTIAZEM LOAD VIA INFUSION
10.0000 mg | Freq: Once | INTRAVENOUS | Status: AC
  Administered 2017-06-06: 10 mg via INTRAVENOUS
  Filled 2017-06-06: qty 10

## 2017-06-06 MED ORDER — ASPIRIN 325 MG PO TABS
325.0000 mg | ORAL_TABLET | Freq: Every day | ORAL | Status: DC
  Administered 2017-06-06: 325 mg via ORAL
  Filled 2017-06-06: qty 1

## 2017-06-06 MED ORDER — ADULT MULTIVITAMIN W/MINERALS CH
1.0000 | ORAL_TABLET | Freq: Every day | ORAL | Status: DC
  Administered 2017-06-06 – 2017-06-07 (×2): 1 via ORAL
  Filled 2017-06-06 (×2): qty 1

## 2017-06-06 MED ORDER — RIVAROXABAN 15 MG PO TABS: 15.0000 mg | ORAL_TABLET | Freq: Every day | ORAL | Status: DC

## 2017-06-06 MED ORDER — RIVAROXABAN 20 MG PO TABS: 20.0000 mg | ORAL_TABLET | Freq: Every day | ORAL | Status: DC

## 2017-06-06 MED ORDER — OMEGA-3 FISH OIL 1000 MG PO CAPS: 1.0000 | ORAL_CAPSULE | Freq: Every day | ORAL | Status: DC

## 2017-06-06 MED ORDER — RIVAROXABAN 15 MG PO TABS
15.0000 mg | ORAL_TABLET | Freq: Once | ORAL | Status: AC
  Administered 2017-06-06: 15 mg via ORAL
  Filled 2017-06-06: qty 1

## 2017-06-06 MED ORDER — OMEGA-3-ACID ETHYL ESTERS 1 G PO CAPS
1.0000 g | ORAL_CAPSULE | Freq: Every day | ORAL | Status: DC
  Administered 2017-06-06 – 2017-06-07 (×2): 1 g via ORAL
  Filled 2017-06-06 (×2): qty 1

## 2017-06-06 NOTE — ED Triage Notes (Signed)
Pt woke up out of her sleep and felt like her heart was racing and had discomfort in left arm. No CP. No N,V. No SOB at rest

## 2017-06-06 NOTE — ED Provider Notes (Signed)
[facility]-EMERGENCY DEPT Provider Note   CSN: [id number] Arrival date & time: 06/06/17  [id number]   By signing my name below, I, Clarisse GougeXavier Herndon, attest that this documentation has been prepared under the direction and in the presence of Ryott Rafferty, Jeannett SeniorStephen, MD. Electronically signed, Clarisse GougeXavier Herndon, ED Scribe. 06/06/17. 3:47 AM.   History   Chief Complaint Chief Complaint  Patient presents with  . Tachycardia   The history is provided by the patient and medical records. No language interpreter was used.    Madison Maxwell is a 83 y.o. female presenting to the Emergency Department concerning palpitations onset ~1 AM today waking her from sleep. Pt also notes increased urination and bowel movements recently. She currently c/o 3/10, L forearm pain radiating to L shoulder that has constantly been present since waking. Pt with h/o osteoarthritis, osteoporosis, cerebral infarction 2 years ago and L hand weakness, mitral valve prolapse and memory deficit. She does not recall h/o AFIB or similar symptoms. Pt denies blood thinner use and known medication allergies. No chest pain, SOB, dizziness, N/V or abdominal pain, numbness, tingling. No other complaints at this time. No cardiologist noted.  Past Medical History:  Diagnosis Date  . Allergic rhinitis   . Anxiety   . Duodenal ulcer   . External hemorrhoid   . Hyperlipidemia   . Memory deficit   . Osteoarthritis   . Osteoporosis   . SUI (stress urinary incontinence), female   . Vitamin D deficiency     Patient Active Problem List   Diagnosis Date Noted  . Gait disorder 12/23/2016  . Dyslipidemia 06/17/2016  . Left hand weakness   . Ataxia   . Cerebral infarction ([facility]) 11/09/2015  . Memory deficit     Past Surgical History:  Procedure Laterality Date  . APPENDECTOMY    . VAGINAL HYSTERECTOMY      OB History    No data available       Home Medications    Prior to Admission medications   Medication Sig Start Date End Date Taking?  Authorizing Provider  aspirin EC 325 MG EC tablet Take 1 tablet (325 mg total) by mouth daily. 11/12/15   Jeralyn BennettZamora, Ezequiel, MD  atorvastatin (LIPITOR) 20 MG tablet Take 1 tablet (20 mg total) by mouth daily at 6 PM. 11/12/15   Jeralyn BennettZamora, Ezequiel, MD  cholecalciferol (VITAMIN D) 1000 units tablet Take 1,000 Units by mouth daily.    [provider]  denosumab (PROLIA) 60 MG/ML SOLN injection Inject 60 mg into the skin every 6 (six) months. Administer in upper arm, thigh, or abdomen    [provider]  Multiple Vitamins-Minerals (MULTIVITAMINS THER. W/MINERALS) TABS Take 1 tablet by mouth daily.    [provider]  omega-3 fish oil (MAXEPA) 1000 MG CAPS capsule Take by mouth daily.    [provider]  rivastigmine (EXELON) 9.5 mg/24hr Place 9.5 mg onto the skin daily.  08/27/15   [provider]  vitamin B-12 (CYANOCOBALAMIN) 1000 MCG tablet Take 1,000 mcg by mouth daily.    [provider]  vitamin C (ASCORBIC ACID) 500 MG tablet Take 500 mg by mouth daily.    [provider]  zolpidem (AMBIEN) 5 MG tablet Take 5 mg by mouth at bedtime.  12/11/15   [provider]    Family History Family History  Problem Relation Age of Onset  . Colon polyps Sister   . Colon cancer Neg Hx     Social History Social History  Substance  Use Topics  . Smoking status: Never Smoker  . Smokeless tobacco: Never Used  . Alcohol use No     Allergies   Patient has no known allergies.   Review of Systems Review of Systems All other systems reviewed and all systems are negative for acute changes except as noted in the HPI and PMH.    Physical Exam Updated Vital Signs BP 121/82 (BP Location: Left Arm)   Pulse 93   Temp (!) 97.3 F (36.3 C) (Oral)   Resp 15   Ht 5\' 5"  (1.651 m)   Wt 44 kg (97 lb 1.6 oz)   SpO2 99%   BMI 16.16 kg/m   Physical Exam  Constitutional: She is oriented to person, place, and time. She appears well-developed  and well-nourished. No distress.  HENT:  Head: Normocephalic and atraumatic.  Mouth/Throat: Oropharynx is clear and moist. No oropharyngeal exudate.  Eyes: Conjunctivae and EOM are normal. Pupils are equal, round, and reactive to light.  Neck: Normal range of motion. Neck supple.  No meningismus.  Cardiovascular: Normal heart sounds and intact distal pulses.  An irregular rhythm present. Tachycardia present.   No murmur heard. Tachycardic to the 130's  Pulmonary/Chest: Effort normal and breath sounds normal. No respiratory distress.  Abdominal: Soft. There is no tenderness. There is no rebound and no guarding.  Musculoskeletal: Normal range of motion. She exhibits no edema or tenderness.  Neurological: She is alert and oriented to person, place, and time. No cranial nerve deficit. She exhibits normal muscle tone. Coordination normal.   5/5 strength throughout. CN 2-12 intact.Equal grip strength.   Skin: Skin is warm.  Psychiatric: She has a normal mood and affect. Her behavior is normal.  Nursing note and vitals reviewed.    ED Treatments / Results  DIAGNOSTIC STUDIES: Oxygen Saturation is 99% on room air,  Normal by my interpretation.  COORDINATION OF CARE: 3:44 AM-Discussed next steps with pt. Pt verbalized understanding and is agreeable with the plan. Will order cardiac workup and observe. Pt offered pain and nausea medications, which she declines at this time.   Labs (all labs ordered are listed, but only abnormal results are displayed) Labs Reviewed  CBC WITH DIFFERENTIAL/PLATELET  BASIC METABOLIC PANEL  PROTIME-INR  TROPONIN I    EKG  EKG Interpretation  Date/Time:  Saturday June 06 2017 03:27:15 EDT Ventricular Rate:  132 PR Interval:    QRS Duration: 91 QT Interval:  327 QTC Calculation: 485 R Axis:   26 Text Interpretation:  Atrial fibrillation Ventricular premature complex RSR' in V1 or V2, probably normal variant Left ventricular hypertrophy Repolarization  abnormality, prob rate related Baseline wander in lead(s) I III aVL new atrial fibrillation Confirmed by Glynn Octave [phone]) on 06/06/2017 3:54:11 AM       Radiology Dg Chest Port 1 View  Result Date: 06/06/2017 CLINICAL DATA:  Acute onset of tachycardia and left arm discomfort. Initial encounter. EXAM: PORTABLE CHEST 1 VIEW COMPARISON:  Chest radiograph performed 01/03/2016 FINDINGS: The lungs are well-aerated and clear. There is no evidence of focal opacification, pleural effusion or pneumothorax. The cardiomediastinal silhouette is within normal limits. No acute osseous abnormalities are seen. IMPRESSION: No acute cardiopulmonary process seen. Electronically Signed   By: Roanna Raider M.D.   On: 06/06/2017 04:13    Procedures Procedures (including critical care time)  Medications Ordered in ED Medications - No data to display   Initial Impression / Assessment and Plan / ED Course  I have reviewed the triage  vital signs and the nursing notes.  Pertinent labs & imaging results that were available during my care of the patient were reviewed by me and considered in my medical decision making (see chart for details).    Patient woke from sleep about 1:30 AM with generalized feeling of unwell, racing heart, palpitations and discomfort and left arm. No chest pain or shortness of breath.  Found to have new onset atrial fibrillation with RVR.  This patients CHA2DS2-VASc Score and unadjusted Ischemic Stroke Rate (% per year) is equal to 3.2 % stroke rate/year from a score of 3  Above score calculated as 1 point each if present [CHF, HTN, DM, Vascular=MI/PAD/Aortic Plaque, Age if 65-74, or Female] Above score calculated as 2 points each if present [Age > 75, or Stroke/TIA/TE]  Patient started on IV Cardizem for rapid atrial fibrillation. She did slow down to the 80s and 90s but remained in A. fib. Blood pressure and mental status are stable. Started on xarelto for anticoagulation.  Labs  are reassuring. Chest x-rays negative. States she is feeling better. Given age and risk factors, will plan admission for observation. Discussed with Dr. Antionette Char.  CRITICAL CARE Performed by: Glynn Octave Total critical care time: 35 minutes Critical care time was exclusive of separately billable procedures and treating other patients. Critical care was necessary to treat or prevent imminent or life-threatening deterioration. Critical care was time spent personally by me on the following activities: development of treatment plan with patient and/or surrogate as well as nursing, discussions with consultants, evaluation of patient's response to treatment, examination of patient, obtaining history from patient or surrogate, ordering and performing treatments and interventions, ordering and review of laboratory studies, ordering and review of radiographic studies, pulse oximetry and re-evaluation of patient's condition.   Final Clinical Impressions(s) / ED Diagnoses   Final diagnoses:  Atrial fibrillation with RVR (HCC)    New Prescriptions New Prescriptions   No medications on file  I personally performed the services described in this documentation, which was scribed in my presence. The recorded information has been reviewed and is accurate.    Glynn Octave, MD 06/06/17 [phone]

## 2017-06-06 NOTE — ED Notes (Signed)
Delay in lab draw MD at bedside. 

## 2017-06-06 NOTE — Progress Notes (Signed)
[facility]                                                                              Patient Demographics  Madison Maxwell, is a 83 y.o. female, DOB - 28-Jun-1920, ZDG:[id number]  Admit date - 06/06/2017   Admitting Physician Madison Deutscher, MD  Outpatient Primary MD for the patient is Madison Ran, MD  Outpatient specialists:   LOS - 0  days   Medical records reviewed and are as summarized below:    Chief Complaint  Patient presents with  . Tachycardia       Brief summary   The patient is a 83 year old female with dementia, osteoporosis, hyperlipidemia, CVA presented with acute onset of palpitations, left arm discomfort, malaise, heart racing. Due to prior history of stroke, patient presented to ED for evaluation. She was found to be tachycardiac and in atrial fibrillation with RVR, heart rate in 130s. Patient was given diltiazem 10 mg IV push and started on Cardizem infusion, patient was started on xarelto. She was admitted to stepdown unit. No prior history of atrial fibrillation.   Assessment & Plan    Principal Problem:   Atrial fibrillation with RVR (HCC), New onset -Presented with acute onset palpitations, left shoulder pain, malaise  - Currently on Cardizem infusion, heart rate still in 120s, no chest pain or shortness of breath - Started on xarelto by admitting physician, Dr Madison Maxwell after discussion with patient and his family - TSH 3.7, d-dimer elevated 0.57, obtain CT angiogram of the chest to rule out PE, denies any recent long distance car travels or flight - Obtain 2-D echocardiogram, will consult cardiology, no prior history of atrial fibrillation -Serial troponins negative   Active Problems:   Memory deficit, dementia - Stable, continue Exelon    Leukopenia, Thrombocytopenia (HCC) - Currently stable, no bleeding or any evidence for infectious process - Folate, B12 high, iron studies within normal limits    History of CVA (cerebrovascular  accident) - Currently no residual deficits, - Continue Lipitor, started on xarelto, will not need aspirin now (increases bleeding risk with aspirin and xarelto both given history of duodenal ulcer. She does not have history of CAD) - DC aspirin. Continue xarelto  Code Status: DO NOT RESUSCITATE  DVT Prophylaxis: Xarelto  Family Communication: Discussed in detail with the patient, all imaging results, lab results explained to the patient    Disposition Plan: Once heart rate is controlled and on oral rate controlling medications  Time Spent in minutes   25 minutes  Procedures:  None  Consultants:   Cardiology  Antimicrobials:      Medications  Scheduled Meds: . aspirin  325 mg Oral Daily  . atorvastatin  20 mg Oral q1800  . cholecalciferol  1,000 Units Oral Daily  . famotidine  20 mg Oral Daily  . multivitamin with minerals  1 tablet Oral Daily  . omega-3 acid ethyl esters  1 g Oral Daily  . [START ON 06/07/2017] rivaroxaban  15 mg Oral Q supper  . rivastigmine  9.5 mg Transdermal Daily  . vitamin B-12  1,000 mcg Oral Daily  . vitamin C  500 mg Oral Daily  . zolpidem  5 mg Oral QHS   Continuous Infusions: . diltiazem (CARDIZEM) infusion 10 mg/hr (06/06/17 0738)   PRN Meds:.acetaminophen, ondansetron (ZOFRAN) IV   Antibiotics   Anti-infectives    None        Subjective:   Madison Maxwell was seen and examined today. Heart rate still in low 100s to 120's on Cardizem infusion at the time of my encounter. Denies any chest pain or shortness of breath, dizziness.  Patient denies abdominal pain, N/V/D/C, new weakness, numbess, tingling. No acute events overnight.    Objective:   Vitals:   06/06/17 0430 06/06/17 0445 06/06/17 0600 06/06/17 0658  BP: 108/74 104/67 116/81 121/82  Pulse: 70 92 95 93  Resp: 11 14 15 15   Temp:    (!) 97.3 F (36.3 C)  TempSrc:    Oral  SpO2: 97% 97% 97% 99%  Weight:    44 kg (97 lb 1.6 oz)  Height:    5\' 5"  (1.651 m)   No intake or  output data in the 24 hours ending 06/06/17 1018   Wt Readings from Last 3 Encounters:  06/06/17 44 kg (97 lb 1.6 oz)  12/23/16 46.1 kg (101 lb 9.6 oz)  06/17/16 45.3 kg (99 lb 12.8 oz)     Exam  General: Alert and oriented x 3, NAD  Eyes: PERRLA, EOMI, Anicteric Sclera,  HEENT:  Atraumatic, normocephalic, normal oropharynx  Cardiovascular: Irregularly irregular rhythm, tachycardia   Respiratory: Clear to auscultation bilaterally, no wheezing, rales or rhonchi  Gastrointestinal: Soft, nontender, nondistended, + bowel sounds  Ext: no pedal edema bilaterally  Neuro: AAOx3, Cr N's II- XII. Strength 5/5 upper and lower extremities bilaterally, speech clear, sensations grossly intact  Musculoskeletal: No digital cyanosis, clubbing  Skin: No rashes  Psych: Normal affect and demeanor, alert and oriented x3    Data Reviewed:  I have personally reviewed following labs and imaging studies  Micro Results Recent Results (from the past 240 hour(s))  MRSA PCR Screening     Status: None   Collection Time: 06/06/17  6:52 AM  Result Value Ref Range Status   MRSA by PCR NEGATIVE NEGATIVE Final    Comment:        The GeneXpert MRSA Assay (FDA approved for NASAL specimens only), is one component of a comprehensive MRSA colonization surveillance program. It is not intended to diagnose MRSA infection nor to guide or monitor treatment for MRSA infections.     Radiology Reports Dg Chest Port 1 View  Result Date: 06/06/2017 CLINICAL DATA:  Acute onset of tachycardia and left arm discomfort. Initial encounter. EXAM: PORTABLE CHEST 1 VIEW COMPARISON:  Chest radiograph performed 01/03/2016 FINDINGS: The lungs are well-aerated and clear. There is no evidence of focal opacification, pleural effusion or pneumothorax. The cardiomediastinal silhouette is within normal limits. No acute osseous abnormalities are seen. IMPRESSION: No acute cardiopulmonary process seen. Electronically Signed    By: Roanna RaiderJeffery  Chang M.D.   On: 06/06/2017 04:13    Lab Data:  CBC:  Recent Labs Lab 06/06/17 0347  WBC 3.6*  NEUTROABS 1.6*  HGB 13.5  HCT 41.9  MCV 96.5  PLT 116*   Basic Metabolic Panel:  Recent Labs Lab 06/06/17 0347  NA 142  K 3.6  CL 108  CO2 27  GLUCOSE 117*  BUN 15  CREATININE 0.59  CALCIUM 9.4  MG 2.3   GFR: Estimated Creatinine Clearance: 29.2 mL/min (by C-G formula based on SCr of 0.59  mg/dL). Liver Function Tests: No results for input(s): AST, ALT, ALKPHOS, BILITOT, PROT, ALBUMIN in the last 168 hours. No results for input(s): LIPASE, AMYLASE in the last 168 hours. No results for input(s): AMMONIA in the last 168 hours. Coagulation Profile:  Recent Labs Lab 06/06/17 0347  INR 1.04   Cardiac Enzymes:  Recent Labs Lab 06/06/17 0347 06/06/17 0605 06/06/17 0853  TROPONINI <0.03 <0.03 <0.03   BNP (last 3 results) No results for input(s): PROBNP in the last 8760 hours. HbA1C: No results for input(s): HGBA1C in the last 72 hours. CBG: No results for input(s): GLUCAP in the last 168 hours. Lipid Profile: No results for input(s): CHOL, HDL, LDLCALC, TRIG, CHOLHDL, LDLDIRECT in the last 72 hours. Thyroid Function Tests:  Recent Labs  06/06/17 0605  TSH 3.770   Anemia Panel:  Recent Labs  06/06/17 0605  VITAMINB12 1,056*  FOLATE >100.0  FERRITIN 89  TIBC 321  IRON 91  RETICCTPCT 0.8   Urine analysis:    Component Value Date/Time   COLORURINE YELLOW 01/03/2016 1930   APPEARANCEUR CLEAR 01/03/2016 1930   LABSPEC 1.012 01/03/2016 1930   PHURINE 7.5 01/03/2016 1930   GLUCOSEU NEGATIVE 01/03/2016 1930   HGBUR NEGATIVE 01/03/2016 1930   BILIRUBINUR NEGATIVE 01/03/2016 1930   KETONESUR NEGATIVE 01/03/2016 1930   PROTEINUR NEGATIVE 01/03/2016 1930   NITRITE NEGATIVE 01/03/2016 1930   LEUKOCYTESUR TRACE (A) 01/03/2016 1930     Arionna Hoggard M.D. [facility] 06/06/2017, 10:18 AM  Pager: [phone] Between 7am to 7pm - call  Pager - [phone]  After 7pm go to [email or web address] - password [email or web address]  Call night coverage person covering after 7pm

## 2017-06-06 NOTE — ED Notes (Signed)
Nurse currently drawing labs 

## 2017-06-06 NOTE — Evaluation (Signed)
Physical Therapy Evaluation Patient Details Name: Madison Maxwell MRN: [id number] DOB: Jul 05, 1920 Today's Date: 06/06/2017   History of Present Illness  Madison Maxwell is a 83 y.o. female with medical history significant for dementia, osteoporosis, hyperlipidemia, and history of CVA, admitted on 06/06/17 with acute onset of palpitations, left arm discomfort, and a nonspecific malaise.  Patient diagnosed with new onset Afib with RVR.  Clinical Impression  Patient presents with problems listed below.  Will benefit from acute PT to maximize functional mobility prior to d/c home with husband.  Patient is able to move well, however has decreased safety awareness.  Encouraged husband to remain close to patient when ambulating.  Encouraged patient to keep RW with her at all times, and to move more slowly for safety.  Patient close to baseline functional level per patient and family.  Do not anticipate any f/u PT needs at d/c.    Follow Up Recommendations No PT follow up;Supervision/Assistance - 24 hour    Equipment Recommendations  None recommended by PT    Recommendations for Other Services       Precautions / Restrictions Precautions Precautions: Fall Precaution Comments: dementia, impulsive Restrictions Weight Bearing Restrictions: No      Mobility  Bed Mobility Overal bed mobility: Modified Independent                Transfers Overall transfer level: Needs assistance Equipment used: Rolling walker (2 wheeled) Transfers: Sit to/from Stand Sit to Stand: Min guard         General transfer comment: Verbal cues for hand placement.  At toilet and bed, patient put RW to the side and tried to walk the remaining distance with no assistive device.  Verbal cues to keep RW with her at all times during gait.  Ambulation/Gait Ambulation/Gait assistance: Min guard Ambulation Distance (Feet): 60 Feet Assistive device: Rolling walker (2 wheeled) Gait Pattern/deviations: Step-through  pattern;Decreased stride length;Drifts right/left;Trunk flexed   Gait velocity interpretation: at or above normal speed for age/gender General Gait Details: Patient moving fairly quickly during gait.  Takes hands off of RW at times.  Encouraged safety by keeping hands on RW and moving at more safe speed.  Stairs            Wheelchair Mobility    Modified Rankin (Stroke Patients Only)       Balance Overall balance assessment: Needs assistance;History of Falls         Standing balance support: Single extremity supported;During functional activity Standing balance-Leahy Scale: Poor Standing balance comment: Requires UE support for balance/safety in stance.                             Pertinent Vitals/Pain Pain Assessment: No/denies pain    Home Living Family/patient expects to be discharged to:: Private residence Living Arrangements: Spouse/significant other Available Help at Discharge: Family;Available 24 hours/day Type of Home: House Home Access: Stairs to enter Entrance Stairs-Rails: None Entrance Stairs-Number of Steps: 1 Home Layout: One level Home Equipment: Walker - 2 wheels;Cane - single point;Bedside commode;Shower seat      Prior Function Level of Independence: Independent with assistive device(s);Needs assistance   Gait / Transfers Assistance Needed: Uses cane/RW for gait  ADL's / Homemaking Assistance Needed: Showers with someone in the house for safety.  Patient prepares meals.  Husband assists with heavier housework.        Hand Dominance   Dominant Hand: Right    Extremity/Trunk  Assessment   Upper Extremity Assessment Upper Extremity Assessment: Generalized weakness    Lower Extremity Assessment Lower Extremity Assessment: Generalized weakness    Cervical / Trunk Assessment Cervical / Trunk Assessment: Kyphotic  Communication   Communication: HOH  Cognition Arousal/Alertness: Awake/alert Behavior During Therapy:  Impulsive Overall Cognitive Status: History of cognitive impairments - at baseline                                 General Comments: Oriented to person, place, situation.  Not to time.  Decreased safety awareness, standing when PT asked her to remain sitting/call for help.        General Comments      Exercises     Assessment/Plan    PT Assessment Patient needs continued PT services  PT Problem List Decreased strength;Decreased activity tolerance;Decreased balance;Decreased mobility;Decreased cognition;Decreased safety awareness;Decreased knowledge of use of DME;Cardiopulmonary status limiting activity       PT Treatment Interventions DME instruction;Gait training;Functional mobility training;Therapeutic activities;Cognitive remediation;Patient/family education    PT Goals (Current goals can be found in the Care Plan section)  Acute Rehab PT Goals Patient Stated Goal: To go home PT Goal Formulation: With patient/family Time For Goal Achievement: 06/13/17 Potential to Achieve Goals: Good    Frequency Min 3X/week   Barriers to discharge        Co-evaluation               AM-PAC PT "6 Clicks" Daily Activity  Outcome Measure Difficulty turning over in bed (including adjusting bedclothes, sheets and blankets)?: None Difficulty moving from lying on back to sitting on the side of the bed? : None Difficulty sitting down on and standing up from a chair with arms (e.g., wheelchair, bedside commode, etc,.)?: None Help needed moving to and from a bed to chair (including a wheelchair)?: A Little Help needed walking in hospital room?: A Little Help needed climbing 3-5 steps with a railing? : A Little 6 Click Score: 21    End of Session Equipment Utilized During Treatment: Gait belt Activity Tolerance: Patient tolerated treatment well Patient left: in bed;with call bell/phone within reach;with bed alarm set;with family/visitor present (sitting EOB;  MD in to see  patient) Nurse Communication: Mobility status PT Visit Diagnosis: Unsteadiness on feet (R26.81);Other abnormalities of gait and mobility (R26.89);Muscle weakness (generalized) (M62.81);History of falling (Z91.81)    Time: 1610-9604 PT Time Calculation (min) (ACUTE ONLY): 28 min   Charges:   PT Evaluation $PT Eval Moderate Complexity: 1 Procedure PT Treatments $Gait Training: 8-22 mins   PT G Codes:   PT G-Codes **NOT FOR INPATIENT CLASS** Functional Assessment Tool Used: AM-PAC 6 Clicks Basic Mobility Functional Limitation: Mobility: Walking and moving around Mobility: Walking and Moving Around Current Status (V4098): At least 20 percent but less than 40 percent impaired, limited or restricted Mobility: Walking and Moving Around Goal Status [phone]): At least 1 percent but less than 20 percent impaired, limited or restricted    Durenda Hurt. Renaldo Fiddler, [facility] Pager [phone]   Vena Austria 06/06/2017, 5:17 PM

## 2017-06-06 NOTE — Consult Note (Signed)
Cardiology Consult Note  Admit date: 06/06/2017 Name: Madison Maxwell 83 y.o.  female DOB:  1919-12-26 MRN:  [id number]  Today's date:  06/06/2017  Referring Physician:   [facility]   Primary Physician:    Dr. Rodrigo Ran  Reason for Consultation:    Atrial fibrillation   IMPRESSIONS: 1.  New diagnosis of atrial fibrillation with rapid ventricular response-unclear of the duration of onset her CHA2DS2VASC score is 5 2.  Prior history of stroke 3.  History of hyperlipidemia 4.  Early dementia  RECOMMENDATION: 1.  Agree with diagnosis of atrial fibrillation and because of her risks she would optimally be anticoagulated.  Her husband already has atrial fibrillation and is under Dr. Elvis Coil care and she would like to see him also for follow-up. 2.  Obtain echocardiogram 3.  Increase diltiazem and add beta blocker since she is still going somewhat fast.  Try to control rate  HISTORY: This very nice 83 year old female is seen in consultation for rapid atrial fibrillation.  She has a history of a stroke previously very early dementia hyperlipidemia and osteoporosis.  She lives at home with her husband and quit driving a few years ago.  She developed a dull ache in her left shoulder and did not feel well and her husband was concerned about the symptoms and took her blood pressure and found her pulse to be fast and brought her to the emergency room.  She was found to be in rapid atrial fibrillation.  She was treated with diltiazem intravenously that resulted in slowing of her heart rate.  I'm seeing her today and she feels perfectly well today and denies pain in her shoulders chest pain or shortness of breath.  She is unaware that her heart is out of rhythm.  She normally denies PND or orthopnea.  She has a mild amount of edema involving her left foot.  She has remote history of duodenal ulcer disease and does take an aspirin a day.  Past Medical History:  Diagnosis Date  . Allergic rhinitis    . Anxiety   . Duodenal ulcer   . External hemorrhoid   . Hyperlipidemia   . Memory deficit   . Osteoarthritis   . Osteoporosis   . SUI (stress urinary incontinence), female   . Vitamin D deficiency       Past Surgical History:  Procedure Laterality Date  . APPENDECTOMY    . VAGINAL HYSTERECTOMY      Allergies:  has No Known Allergies.   Medications: Prior to Admission medications   Medication Sig Start Date End Date Taking? Authorizing Provider  aspirin EC 325 MG EC tablet Take 1 tablet (325 mg total) by mouth daily. 11/12/15   Jeralyn Bennett, MD  atorvastatin (LIPITOR) 20 MG tablet Take 1 tablet (20 mg total) by mouth daily at 6 PM. 11/12/15   Jeralyn Bennett, MD  cholecalciferol (VITAMIN D) 1000 units tablet Take 1,000 Units by mouth daily.    [provider]  denosumab (PROLIA) 60 MG/ML SOLN injection Inject 60 mg into the skin every 6 (six) months. Administer in upper arm, thigh, or abdomen    [provider]  Multiple Vitamins-Minerals (MULTIVITAMINS THER. W/MINERALS) TABS Take 1 tablet by mouth daily.    [provider]  omega-3 fish oil (MAXEPA) 1000 MG CAPS capsule Take by mouth daily.    [provider]  rivastigmine (EXELON) 9.5 mg/24hr Place 9.5 mg onto the skin daily.  08/27/15   [provider]  vitamin B-12 (CYANOCOBALAMIN) 1000 MCG tablet Take 1,000 mcg by mouth daily.    [provider]  vitamin C (ASCORBIC ACID) 500 MG tablet Take 500 mg by mouth daily.    [provider]  zolpidem (AMBIEN) 5 MG tablet Take 5 mg by mouth at bedtime.  12/11/15   [provider]   Family History: Family Status  Relation Status  . Sister (Not Specified)  . Neg Hx (Not Specified)    Social History:   reports that she has never smoked. She has never used smokeless tobacco. She reports that she does not drink alcohol or use drugs.   Social History   Social History Narrative   Daily caffeine     Review  of Systems: Other than as noted above the remainder of the review of systems is unremarkable.  Physical Exam: BP 121/82 (BP Location: Left Arm)   Pulse 93   Temp (!) 97.3 F (36.3 C) (Oral)   Resp 15   Ht 5\' 5"  (1.651 m)   Wt 44 kg (97 lb 1.6 oz)   SpO2 99%   BMI 16.16 kg/m   General appearance: She is a thin pleasant elderly white female in no acute distress Head: Normocephalic, without obvious abnormality, atraumatic Eyes: conjunctivae/corneas clear. PERRL, EOM's intact. Fundi not examined  Neck: no adenopathy, no carotid bruit, no JVD and supple, symmetrical, trachea midline Lungs: clear to auscultation bilaterally Heart: Somewhat rapid irregular rhythm, normal S1-S2 no S3 Abdomen: soft, non-tender; bowel sounds normal; no masses,  no organomegaly Pelvic: deferred Extremities: extremities normal, atraumatic, no cyanosis or edema Pulses: 2+ and symmetric Skin: Skin color, texture, turgor normal. No rashes or lesions Neurologic: Grossly normal Psych: Alert and oriented x 3  Labs: CBC  Recent Labs  06/06/17 0347 06/06/17 0605  WBC 3.6*  --   RBC 4.34 4.35  HGB 13.5  --   HCT 41.9  --   PLT 116*  --   MCV 96.5  --   MCH 31.1  --   MCHC 32.2  --   RDW 12.7  --   LYMPHSABS 1.6  --   MONOABS 0.2  --   EOSABS 0.2  --   BASOSABS 0.0  --    CMP   Recent Labs  06/06/17 0347  NA 142  K 3.6  CL 108  CO2 27  GLUCOSE 117*  BUN 15  CREATININE 0.59  CALCIUM 9.4  GFRNONAA >60  GFRAA >60   Cardiac Panel (last 3 results)  Recent Labs  06/06/17 0347 06/06/17 0605 06/06/17 0853  TROPONINI <0.03 <0.03 <0.03     Radiology:  No acute cardiopulmonary process noted  EKG: Atrial fibrillation with rapid ventricular response Independently reviewed by me  Signed:  W. Ashley RoyaltySpencer Tilley, Jr. MD [facility]ACC   Cardiology Consultant  06/06/2017, 2:20 PM

## 2017-06-06 NOTE — Progress Notes (Signed)
*   Echocardiogram 2D Echocardiogram has been performed.  Briella Hobday T Reed Eifert 06/06/2017, 1:22 PM

## 2017-06-06 NOTE — Progress Notes (Signed)
ANTICOAGULATION CONSULT NOTE - Follow Up Consult  Pharmacy Consult for rivaroxaban  Indication: atrial fibrillation  No Known Allergies  Patient Measurements: Height: 5\' 5"  (165.1 cm) Weight: 97 lb 1.6 oz (44 kg) IBW/kg (Calculated) : 57   Vital Signs: Temp: 97.3 F (36.3 C) (07/07 0658) Temp Source: Oral (07/07 0658) BP: 121/82 (07/07 0658) Pulse Rate: 93 (07/07 0658)  Labs:  Recent Labs  06/06/17 0347 06/06/17 0605 06/06/17 0853  HGB 13.5  --   --   HCT 41.9  --   --   PLT 116*  --   --   LABPROT 13.6  --   --   INR 1.04  --   --   CREATININE 0.59  --   --   TROPONINI <0.03 <0.03 <0.03    Estimated Creatinine Clearance: 29.2 mL/min (by C-G formula based on SCr of 0.59 mg/dL).     Assessment: Madison Maxwell admitted with Afib RVR 120s on diltiazem drip.  Rivaroxaban 15mg  daily started for anticoagulation.  Dose ok for CrCl < 6750ml/min.  CBC stable  Goal of Therapy:   Monitor platelets by anticoagulation protocol: Yes   Plan:  Rivaroxaban 15mg  daily Monitor s/s bleeding    Leota SauersLisa Kiyonna Tortorelli Pharm.D. CPP, BCPS Clinical Pharmacist [phone] 06/06/2017 1:52 PM

## 2017-06-06 NOTE — H&P (Signed)
History and Physical    Madison Maxwell:[id number] DOB: October 15, 1920 DOA: 06/06/2017  PCP: Rodrigo Ran, MD   Patient coming from: Home  Chief Complaint: Malaise, palpitation, left shoulder   HPI: Madison Maxwell is a 83 y.o. female with medical history significant for dementia, osteoporosis, hyperlipidemia, and history of CVA, now presenting to the emergency department with acute onset of palpitations, left arm discomfort, and a nonspecific malaise. Patient reports that she was asleep, but woke early this morning feeling unwell. She had a dull ache in the left shoulder and felt her heart racing. She denies any associated nausea, diaphoresis, or dyspnea. She denies chest pain. She has never experienced similar symptoms previously. She alerted her husband, and given her history of stroke which manifested with arm numbness, they grew quite concerned for recurrent stroke and brought her into the ED for evaluation. She had gone to bed in her usual state and denies any recent illness, denies fevers or chills, and denies cough or dyspnea. No sore throat or voice changes. Reports a remote history of peptic ulcer disease with no indigestion or heartburn symptoms in years. She walks with a walker or cane, and denies falls.  ED Course: Upon arrival to the ED, patient is found to be saturating well on room air, tachycardic in the 130s, and with stable blood pressure and respirations. EKG features atrial fibrillation with RVR, rate 130s. Chest x-ray is negative for acute cardiopulmonary disease. Chemistry panel is unremarkable, including a normal magnesium level. CBC features a leukopenia with WBC 3600 and a thrombocytopenia with platelets 116,000. INR is within the normal limits and troponin is undetectable. Patient was given 10 mg IV push of diltiazem and started on diltiazem infusion. She was treated with a dose of Xarelto. She remained in atrial fibrillation, but rates improved down to the 90s with diltiazem.  Shoulder discomfort resolved, there has not been any chest pain or acute respiratory distress, and she will be observed on the stepdown unit for ongoing evaluation and management of new onset atrial fibrillation with RVR.  Review of Systems:  All other systems reviewed and apart from HPI, are negative.  Past Medical History:  Diagnosis Date  . Allergic rhinitis   . Anxiety   . Duodenal ulcer   . External hemorrhoid   . Hyperlipidemia   . Memory deficit   . Osteoarthritis   . Osteoporosis   . SUI (stress urinary incontinence), female   . Vitamin D deficiency     Past Surgical History:  Procedure Laterality Date  . APPENDECTOMY    . VAGINAL HYSTERECTOMY       reports that she has never smoked. She has never used smokeless tobacco. She reports that she does not drink alcohol or use drugs.  No Known Allergies  Family History  Problem Relation Age of Onset  . Colon polyps Sister   . Colon cancer Neg Hx      Prior to Admission medications   Medication Sig Start Date End Date Taking? Authorizing Provider  aspirin EC 325 MG EC tablet Take 1 tablet (325 mg total) by mouth daily. 11/12/15   Jeralyn Bennett, MD  atorvastatin (LIPITOR) 20 MG tablet Take 1 tablet (20 mg total) by mouth daily at 6 PM. 11/12/15   Jeralyn Bennett, MD  cholecalciferol (VITAMIN D) 1000 units tablet Take 1,000 Units by mouth daily.    [provider]  denosumab (PROLIA) 60 MG/ML SOLN injection Inject 60 mg into the skin every 6 (six) months.  Administer in upper arm, thigh, or abdomen    [provider]  Multiple Vitamins-Minerals (MULTIVITAMINS THER. W/MINERALS) TABS Take 1 tablet by mouth daily.    [provider]  omega-3 fish oil (MAXEPA) 1000 MG CAPS capsule Take by mouth daily.    [provider]  rivastigmine (EXELON) 9.5 mg/24hr Place 9.5 mg onto the skin daily.  08/27/15   [provider]  vitamin B-12 (CYANOCOBALAMIN) 1000 MCG tablet Take 1,000 mcg by  mouth daily.    [provider]  vitamin C (ASCORBIC ACID) 500 MG tablet Take 500 mg by mouth daily.    [provider]  zolpidem (AMBIEN) 5 MG tablet Take 5 mg by mouth at bedtime.  12/11/15   [provider]    Physical Exam: Vitals:   06/06/17 0405 06/06/17 0415 06/06/17 0430 06/06/17 0445  BP: 105/65 111/64 108/74 104/67  Pulse: (!) 118 85 70 92  Resp: 10 15 11 14   SpO2: 98% 98% 97% 97%  Weight:      Height:          Constitutional: NAD, calm, comfortable Eyes: PERTLA, lids and conjunctivae normal ENMT: Mucous membranes are moist. Posterior pharynx clear of any exudate or lesions.   Neck: normal, supple, no masses, no thyromegaly Respiratory: clear to auscultation bilaterally, no wheezing, no crackles. Normal respiratory effort.   Cardiovascular: Rate ~100 and regular. No extremity edema. No significant JVD. Abdomen: No distension, no tenderness, no masses palpated. Bowel sounds normal.  Musculoskeletal: no clubbing / cyanosis. No joint deformity upper and lower extremities.    Skin: no significant rashes, lesions, ulcers. Warm, dry, well-perfused. Neurologic: CN 2-12 grossly intact. Sensation intact, DTR normal. Strength 5/5 in all 4 limbs.  Psychiatric: Alert and oriented to person, place, and situation, but not oriented to month, year, or president. Pleasant and cooperative.      Labs on Admission: I have personally reviewed following labs and imaging studies  CBC:  Recent Labs Lab 06/06/17 0347  WBC 3.6*  NEUTROABS 1.6*  HGB 13.5  HCT 41.9  MCV 96.5  PLT 116*   Basic Metabolic Panel:  Recent Labs Lab 06/06/17 0347  NA 142  K 3.6  CL 108  CO2 27  GLUCOSE 117*  BUN 15  CREATININE 0.59  CALCIUM 9.4  MG 2.3   GFR: Estimated Creatinine Clearance: 30.1 mL/min (by C-G formula based on SCr of 0.59 mg/dL). Liver Function Tests: No results for input(s): AST, ALT, ALKPHOS, BILITOT, PROT, ALBUMIN in the last 168 hours. No results  for input(s): LIPASE, AMYLASE in the last 168 hours. No results for input(s): AMMONIA in the last 168 hours. Coagulation Profile:  Recent Labs Lab 06/06/17 0347  INR 1.04   Cardiac Enzymes:  Recent Labs Lab 06/06/17 0347  TROPONINI <0.03   BNP (last 3 results) No results for input(s): PROBNP in the last 8760 hours. HbA1C: No results for input(s): HGBA1C in the last 72 hours. CBG: No results for input(s): GLUCAP in the last 168 hours. Lipid Profile: No results for input(s): CHOL, HDL, LDLCALC, TRIG, CHOLHDL, LDLDIRECT in the last 72 hours. Thyroid Function Tests: No results for input(s): TSH, T4TOTAL, FREET4, T3FREE, THYROIDAB in the last 72 hours. Anemia Panel: No results for input(s): VITAMINB12, FOLATE, FERRITIN, TIBC, IRON, RETICCTPCT in the last 72 hours. Urine analysis:    Component Value Date/Time   COLORURINE YELLOW 01/03/2016 1930   APPEARANCEUR CLEAR 01/03/2016 1930   LABSPEC 1.012 01/03/2016 1930   PHURINE 7.5 01/03/2016 1930  GLUCOSEU NEGATIVE 01/03/2016 1930   HGBUR NEGATIVE 01/03/2016 1930   BILIRUBINUR NEGATIVE 01/03/2016 1930   KETONESUR NEGATIVE 01/03/2016 1930   PROTEINUR NEGATIVE 01/03/2016 1930   NITRITE NEGATIVE 01/03/2016 1930   LEUKOCYTESUR TRACE (A) 01/03/2016 1930   Sepsis Labs: @LABRCNTIP (procalcitonin:4,lacticidven:4) )No results found for this or any previous visit (from the past 240 hour(s)).   Radiological Exams on Admission: Dg Chest Port 1 View  Result Date: 06/06/2017 CLINICAL DATA:  Acute onset of tachycardia and left arm discomfort. Initial encounter. EXAM: PORTABLE CHEST 1 VIEW COMPARISON:  Chest radiograph performed 01/03/2016 FINDINGS: The lungs are well-aerated and clear. There is no evidence of focal opacification, pleural effusion or pneumothorax. The cardiomediastinal silhouette is within normal limits. No acute osseous abnormalities are seen. IMPRESSION: No acute cardiopulmonary process seen. Electronically Signed   By:  Roanna RaiderJeffery  Chang M.D.   On: 06/06/2017 04:13    EKG: Independently reviewed. Atrial fibrillation with RVR (rate 132), RSR' in V2.   Assessment/Plan  1. New-onset atrial fibrillation with RVR - Pt presents with acute-onset of palpitations with left shoulder ache and non-specific malaise  - Found to be in atrial fibrillation with rate 130's, appears to be new-onset  - She remains in atrial fibrillation but rate controlled on diltiazem infusion; shoulder ache resolved with rate-control  - CHADS-VASc 5 (age x2, gender, CVA x2) - Discussed risk/benefit of AC with pt as well as her husband and son at the bedside  - Continue diltiazem infusion, convert to oral as tolerated - Start Xarelto  - Check TSH and echocardiogram, cycle cardiac enzymes to exclude an ischemic etiology    2. Leukopenia, thrombocytopenia  - WBC is 3,600 and admission and platelets 116k  - No bleeding is evident and no evidence for an infectious process  - Possibly secondary to Prolia  - Check iron studies, B12, and folate levels; check TSH as above   3. History of CVA - No deficits identified on exam  - Continue Lipitor and ASA   4. Dementia  - Stable, at baseline per family at bedside - Continue Exelon     DVT prophylaxis: Xarelto Code Status: DNR Family Communication: Son and husband updated at bedside Disposition Plan: Observe on telemetry Consults called: None Admission status: Observation    Briscoe Deutscherimothy S Burle Kwan, MD [facility] Pager [phone]  If 7PM-7AM, please contact night-coverage [email or web address] Password [email or web address]  06/06/2017, 6:01 AM

## 2017-06-07 DIAGNOSIS — I480 Paroxysmal atrial fibrillation: Secondary | ICD-10-CM | POA: Diagnosis not present

## 2017-06-07 DIAGNOSIS — Z7901 Long term (current) use of anticoagulants: Secondary | ICD-10-CM | POA: Diagnosis not present

## 2017-06-07 DIAGNOSIS — I4891 Unspecified atrial fibrillation: Secondary | ICD-10-CM | POA: Diagnosis not present

## 2017-06-07 DIAGNOSIS — I119 Hypertensive heart disease without heart failure: Secondary | ICD-10-CM | POA: Diagnosis not present

## 2017-06-07 DIAGNOSIS — Z8673 Personal history of transient ischemic attack (TIA), and cerebral infarction without residual deficits: Secondary | ICD-10-CM | POA: Diagnosis not present

## 2017-06-07 LAB — CBC
HCT: 39.3 % (ref 36.0–46.0)
Hemoglobin: 12.9 g/dL (ref 12.0–15.0)
MCH: 31.1 pg (ref 26.0–34.0)
MCHC: 32.8 g/dL (ref 30.0–36.0)
MCV: 94.7 fL (ref 78.0–100.0)
PLATELETS: 123 10*3/uL — AB (ref 150–400)
RBC: 4.15 MIL/uL (ref 3.87–5.11)
RDW: 12.5 % (ref 11.5–15.5)
WBC: 5.1 10*3/uL (ref 4.0–10.5)

## 2017-06-07 LAB — BASIC METABOLIC PANEL
ANION GAP: 7 (ref 5–15)
BUN: 20 mg/dL (ref 6–20)
CALCIUM: 9 mg/dL (ref 8.9–10.3)
CO2: 27 mmol/L (ref 22–32)
Chloride: 106 mmol/L (ref 101–111)
Creatinine, Ser: 0.62 mg/dL (ref 0.44–1.00)
GFR calc Af Amer: >60 mL/min (ref >60)
GLUCOSE: 107 mg/dL — AB (ref 65–99)
Potassium: 3.8 mmol/L (ref 3.5–5.1)
SODIUM: 140 mmol/L (ref 135–145)

## 2017-06-07 MED ORDER — RIVAROXABAN 15 MG PO TABS: 15.0000 mg | ORAL_TABLET | Freq: Every day | ORAL | 4 refills | Status: DC

## 2017-06-07 MED ORDER — METOPROLOL TARTRATE 25 MG PO TABS: 25.0000 mg | ORAL_TABLET | Freq: Two times a day (BID) | ORAL | 3 refills | Status: DC

## 2017-06-07 NOTE — Plan of Care (Signed)
Problem: Safety: Goal: Ability to remain free from injury will improve Outcome: Progressing Patient remains free from injury. Bed alarm armed, daughter at bedside. Patient confused to time when waking up after ambien. Able to be redirected back to bed/sleep.   Problem: Physical Regulation: Goal: Ability to maintain clinical measurements within normal limits will improve Outcome: Progressing HR dropping below 60's. IV Cardizem stopped per parameters. Cardiology paged. PO metoprolol started yesterday.

## 2017-06-07 NOTE — Progress Notes (Signed)
I reviewed d/c instructions with patient, her husband and son. D/c'd in stable condition in wheelchair to private vehicle

## 2017-06-07 NOTE — Care Management Note (Addendum)
Case Management Note  Patient Details  Name: Cheron Everyva C Kloster MRN: [id number] Date of Birth: [birth year]  Subjective/Objective:   Received call from Josh, RN , pt will be discharged with new Xarelto today. Instructed Josh to give pt 30 day free Xarelto card. Called in the room and spoke to SherrillVicki, her daughter and explained process. No questions or other needs.   Action/Plan: CM will sign off for now but will be available should additional discharge needs arise or disposition change.    Expected Discharge Date:                  Expected Discharge Plan:     In-House Referral:     Discharge planning Services  CM Consult, Other - See comment  Post Acute Care Choice:    Choice offered to:     DME Arranged:    DME Agency:     HH Arranged:    HH Agency:     Status of Service:  Completed, signed off  If discussed at Long Length of Stay Meetings, dates discussed:    Additional Comments:  Yvone NeuCrutchfield, Violeta Lecount M, RN 06/07/2017, 8:37 AM

## 2017-06-07 NOTE — Progress Notes (Signed)
Patient converted to SB from Afib. HR high 50's - 60's.

## 2017-06-07 NOTE — Discharge Summary (Signed)
Physician Discharge Summary   Patient ID: Madison Maxwell MRN: [id number] DOB/AGE: [birth year] 83 y.o.  Admit date: 06/06/2017 Discharge date: 06/07/2017  Primary Care Physician:  Rodrigo Ran, MD  Discharge Diagnoses:       Atrial fibrillation with RVR . Leukopenia . Thrombocytopenia (HCC) . History of CVA  . Memory deficit   Consults: Cardiology  Recommendations for Outpatient Follow-up:  1. Patient started on xarelto  2. Please repeat CBC/BMET at next visit 3. DC aspirin   DIET: Heart healthy diet   Allergies:  No Known Allergies   DISCHARGE MEDICATIONS: Discharge Medication List as of 06/07/2017 11:24 AM    START taking these medications   Details  metoprolol tartrate (LOPRESSOR) 25 MG tablet Take 1 tablet (25 mg total) by mouth 2 (two) times daily., Starting Sun 06/07/2017, Print    Rivaroxaban (XARELTO) 15 MG TABS tablet Take 1 tablet (15 mg total) by mouth daily with supper., Starting Sun 06/07/2017, Print      CONTINUE these medications which have NOT CHANGED   Details  atorvastatin (LIPITOR) 20 MG tablet Take 1 tablet (20 mg total) by mouth daily at 6 PM., Starting Mon 11/12/2015, Print    cholecalciferol (VITAMIN D) 1000 units tablet Take 1,000 Units by mouth daily., Historical Med    denosumab (PROLIA) 60 MG/ML SOLN injection Inject 60 mg into the skin every 6 (six) months. Administer in upper arm, thigh, or abdomen, Historical Med    diphenhydramine-acetaminophen (TYLENOL PM) 25-500 MG TABS tablet Take 1 tablet by mouth at bedtime as needed (sleep)., Historical Med    Multiple Vitamins-Minerals (MULTIVITAMINS THER. W/MINERALS) TABS Take 1 tablet by mouth daily., Historical Med    omega-3 fish oil (MAXEPA) 1000 MG CAPS capsule Take by mouth daily., Historical Med    rivastigmine (EXELON) 9.5 mg/24hr Place 9.5 mg onto the skin daily. , Starting Mon 08/27/2015, Historical Med    vitamin B-12 (CYANOCOBALAMIN) 1000 MCG tablet Take 1,000 mcg by mouth daily.,  Historical Med    vitamin C (ASCORBIC ACID) 500 MG tablet Take 500 mg by mouth daily., Historical Med    zolpidem (AMBIEN) 5 MG tablet Take 5 mg by mouth at bedtime. , Starting Tue 12/11/2015, Historical Med      STOP taking these medications     aspirin EC 325 MG EC tablet          Brief H and P: For complete details please refer to admission H and P, but in brief The patient is a 83 year old female with dementia, osteoporosis, hyperlipidemia, CVA presented with acute onset of palpitations, left arm discomfort, malaise, heart racing. Due to prior history of stroke, patient presented to ED for evaluation. She was found to be tachycardiac and in atrial fibrillation with RVR, heart rate in 130s. Patient was given diltiazem 10 mg IV push and started on Cardizem infusion, patient was started on xarelto. She was admitted to stepdown unit. No prior history of atrial fibrillation.  Hospital Course:   Atrial fibrillation with RVR (HCC), New onset - Patient presented with acute onset palpitations, left shoulder pain, malaise  - She was placed on Cardizem infusion, cardiology was consulted. Patient was started on metoprolol. Once heart rate was controlled and in normal sinus rhythm, Cardizem drip was weaned off. - Started on xarelto by admitting physician, Dr Antionette Char after discussion with patient and his family.  - TSH 3.7, d-dimer elevated 0.57, CT angiogram of the chest was negative for pulmonary embolism.  - Serial troponins negative, UA negative  for UTI, chest x-ray negative for any pneumonia. - 2-D echo showed EF of 65-70%, normal wall motion. - Patient was followed closely by cardiology while inpatient, wants to follow with Dr. Swaziland outpatient as Dr. Swaziland is her husband's primary cardiologist. I have sent staff message in basket to Dr Swaziland and their office coordinator for appointment.    Memory deficit, dementia - Stable, continue Exelon    Leukopenia, Thrombocytopenia (HCC) -  Currently stable, no bleeding or any evidence for infectious process - Folate, B12 high, iron studies within normal limits    History of CVA (cerebrovascular accident) - Currently no residual deficits, - Continue Lipitor, started on xarelto, will not need aspirin now (increases bleeding risk with aspirin and xarelto both given history of duodenal ulcer. She does not have history of CAD) - DC aspirin. Continue xarelto   Day of Discharge BP 108/73 (BP Location: Left Arm)   Pulse 68   Temp 98.3 F (36.8 C) (Oral)   Resp 20   Ht 5\' 5"  (1.651 m)   Wt 42.2 kg (93 lb)   SpO2 99%   BMI 15.48 kg/m   Physical Exam: General: Alert and awake oriented x3 not in any acute distress. HEENT: anicteric sclera, pupils reactive to light and accommodation CVS: S1-S2 clear no murmur rubs or gallops Chest: clear to auscultation bilaterally, no wheezing rales or rhonchi Abdomen: soft nontender, nondistended, normal bowel sounds Extremities: no cyanosis, clubbing or edema noted bilaterally Neuro: Cranial nerves II-XII intact, no focal neurological deficits   The results of significant diagnostics from this hospitalization (including imaging, microbiology, ancillary and laboratory) are listed below for reference.    LAB RESULTS: Basic Metabolic Panel:  Recent Labs Lab 06/06/17 0347 06/07/17 0355  NA 142 140  K 3.6 3.8  CL 108 106  CO2 27 27  GLUCOSE 117* 107*  BUN 15 20  CREATININE 0.59 0.62  CALCIUM 9.4 9.0  MG 2.3  --    Liver Function Tests: No results for input(s): AST, ALT, ALKPHOS, BILITOT, PROT, ALBUMIN in the last 168 hours. No results for input(s): LIPASE, AMYLASE in the last 168 hours. No results for input(s): AMMONIA in the last 168 hours. CBC:  Recent Labs Lab 06/06/17 0347 06/07/17 0355  WBC 3.6* 5.1  NEUTROABS 1.6*  --   HGB 13.5 12.9  HCT 41.9 39.3  MCV 96.5 94.7  PLT 116* 123*   Cardiac Enzymes:  Recent Labs Lab 06/06/17 0853 06/06/17 1804  TROPONINI  <0.03 <0.03   BNP: Invalid input(s): POCBNP CBG: No results for input(s): GLUCAP in the last 168 hours.  Significant Diagnostic Studies:  Ct Angio Chest Pe W Or Wo Contrast  Result Date: 06/06/2017 CLINICAL DATA:  Syncopal episode and tachycardia. EXAM: CT ANGIOGRAPHY CHEST WITH CONTRAST TECHNIQUE: Multidetector CT imaging of the chest was performed using the standard protocol during bolus administration of intravenous contrast. Multiplanar CT image reconstructions and MIPs were obtained to evaluate the vascular anatomy. CONTRAST:  80 mL Isovue 370 IV COMPARISON:  Chest x-ray earlier today. FINDINGS: Cardiovascular: The pulmonary arteries are very well opacified. There is no evidence of pulmonary embolism. The thoracic aorta is not well opacified and demonstrates no aneurysmal disease. The heart size is at the upper limits of normal. Coronary atherosclerosis identified with calcified plaque in the distribution of the LAD, left circumflex coronary artery and RCA. No pericardial effusion identified. Mediastinum/Nodes: No enlarged mediastinal, hilar, or axillary lymph nodes. Thyroid gland, trachea, and esophagus demonstrate no significant findings. Lungs/Pleura: Mild  scarring and atelectasis present at both lung bases. There is no evidence of pulmonary edema, consolidation, pneumothorax, nodule or pleural fluid. Upper Abdomen: No acute abnormality. Musculoskeletal: No chest wall abnormality. No acute or significant osseous findings. Review of the MIP images confirms the above findings. IMPRESSION: No acute findings. No evidence of pulmonary embolism. Evidence of coronary atherosclerosis with calcified plaque in a 3 vessel distribution. Electronically Signed   By: Irish LackGlenn  Yamagata M.D.   On: 06/06/2017 15:21   Dg Chest Port 1 View  Result Date: 06/06/2017 CLINICAL DATA:  Acute onset of tachycardia and left arm discomfort. Initial encounter. EXAM: PORTABLE CHEST 1 VIEW COMPARISON:  Chest radiograph performed  01/03/2016 FINDINGS: The lungs are well-aerated and clear. There is no evidence of focal opacification, pleural effusion or pneumothorax. The cardiomediastinal silhouette is within normal limits. No acute osseous abnormalities are seen. IMPRESSION: No acute cardiopulmonary process seen. Electronically Signed   By: Roanna RaiderJeffery  Chang M.D.   On: 06/06/2017 04:13    2D ECHO: Study Conclusions  - Left ventricle: The cavity size was normal. Wall thickness was   increased in a pattern of moderate LVH. Systolic function was   vigorous. The estimated ejection fraction was in the range of 65%   to 70%. Wall motion was normal; there were no regional wall   motion abnormalities. - Aortic valve: Trileaflet; normal thickness, mildly calcified   leaflets. - Mitral valve: There was mild to moderate regurgitation. - Left atrium: The atrium was mildly dilated.  -------------------------------------------------------------------   Disposition and Follow-up: Discharge Instructions    Amb referral to AFIB Clinic    Complete by:  As directed    Diet - low sodium heart healthy    Complete by:  As directed    Discharge instructions    Complete by:  As directed    Please do not take aspirin while you are on xarelto  Please avoid ibuprofen, Motrin, Aleve or any other kind of NSAIDs for headaches or pains. You can take Tylenol.   Please come to the ER or report to your doctor immediately if you notice any GI bleeding.   Increase activity slowly    Complete by:  As directed        DISPOSITION: home    DISCHARGE FOLLOW-UP Follow-up Information    Rodrigo RanPerini, Mark, MD. Schedule an appointment as soon as possible for a visit in 2 week(s).   Specialty:  Internal Medicine Contact information: [address] [place] [address] [phone]        [place]ilandJordan, Peter M, MD. Schedule an appointment as soon as possible for a visit in 2 week(s).   Specialty:  Cardiology Contact information: [address] [place] [address] [phone]            Time spent on Discharge: 35mins   Signed:   Thad Rangeripudeep Rai M.D. [facility] 06/07/2017, 12:28 PM Pager: [phone]

## 2017-06-07 NOTE — Progress Notes (Signed)
Patient awake and alert this morning. Knows self/birthday, her daughter in the room, and that it's morning. Still confused on location but is able to work bed. States, "I slept wonderfully" and doesn't remember waking up last night. Spoke with daughter, will address with ordered Ambien with day team. Will also address with husband how wife is at home when taking Ambien.

## 2017-06-07 NOTE — Progress Notes (Signed)
Subjective:  She feels fine today and wishes to go home.  Mild short-term memory loss noted.  No complaints of shortness of breath or chest pain.  Echocardiogram shows moderate LVH with good systolic function.  Objective:  Vital Signs in the last 24 hours: BP 108/73 (BP Location: Left Arm)   Pulse 68   Temp 98.3 F (36.8 C) (Oral)   Resp 20   Ht 5\' 5"  (1.651 m)   Wt 42.2 kg (93 lb)   SpO2 99%   BMI 15.48 kg/m   Physical Exam: Elderly thin pleasant female in no acute distress Lungs:  Clear  Cardiac:  Regular rhythm, normal S1 and S2, no S3 Abdomen:  Soft, nontender, no masses Extremities:  No edema present  Intake/Output from previous day: 07/07 0701 - 07/08 0700 In: 856.3 [P.O.:720; I.V.:136.3] Out: 550 [Urine:550] Weight Filed Weights   06/06/17 0329 06/06/17 0658 06/07/17 0455  Weight: 45.4 kg (100 lb) 44 kg (97 lb 1.6 oz) 42.2 kg (93 lb)    Lab Results: Basic Metabolic Panel:  Recent Labs  09/81/1906/06/17 0347 06/07/17 0355  NA 142 140  K 3.6 3.8  CL 108 106  CO2 27 27  GLUCOSE 117* 107*  BUN 15 20  CREATININE 0.59 0.62    CBC:  Recent Labs  06/06/17 0347 06/07/17 0355  WBC 3.6* 5.1  NEUTROABS 1.6*  --   HGB 13.5 12.9  HCT 41.9 39.3  MCV 96.5 94.7  PLT 116* 123*   Cardiac Panel (last 3 results)  Recent Labs  06/06/17 0605 06/06/17 0853 06/06/17 1804  TROPONINI <0.03 <0.03 <0.03    PROTIME: Lab Results  Component Value Date   INR 1.04 06/06/2017   INR 1.04 01/03/2016    Telemetry: Currently sinus rhythm with sinus bradycardia  Assessment/Plan:  1.  Paroxysmal atrial fibrillation spontaneously converted to sinus rhythm following beta blocker addition 2.  Hyperlipidemia 3.  Prior history of stroke 4.  LVH noted on echocardiogram questionable previous hypertension  Recommendations:  Okay to discharge on metoprolol 25 mg twice daily and Xarelto 15 mg daily.  She would like to see Dr. Peter SwazilandJordan since her husband sees him and will try  to arrange outpatient follow-up with him.       Darden PalmerW. Spencer Tilley, Jr.  MD [facility]  06/07/2017, 11:16 AM

## 2017-06-08 LAB — URINE CULTURE

## 2017-06-16 ENCOUNTER — Telehealth (HOSPITAL_COMMUNITY): Payer: Self-pay | Admitting: *Deleted

## 2017-06-16 NOTE — Telephone Encounter (Signed)
Pt on referral list for afib from the ED.  Pt already scheduled with Randall AnBrittany Strader 07/20

## 2017-06-19 ENCOUNTER — Ambulatory Visit: Payer: Medicare Other | Admitting: Student

## 2017-06-19 ENCOUNTER — Ambulatory Visit (INDEPENDENT_AMBULATORY_CARE_PROVIDER_SITE_OTHER): Payer: Medicare Other | Admitting: Student

## 2017-06-19 ENCOUNTER — Encounter: Payer: Self-pay | Admitting: Student

## 2017-06-19 VITALS — BP 153/61 | HR 51 | Ht 65.0 in | Wt 99.0 lb

## 2017-06-19 DIAGNOSIS — I340 Nonrheumatic mitral (valve) insufficiency: Secondary | ICD-10-CM | POA: Diagnosis not present

## 2017-06-19 DIAGNOSIS — E785 Hyperlipidemia, unspecified: Secondary | ICD-10-CM

## 2017-06-19 DIAGNOSIS — Z8673 Personal history of transient ischemic attack (TIA), and cerebral infarction without residual deficits: Secondary | ICD-10-CM

## 2017-06-19 DIAGNOSIS — Z7901 Long term (current) use of anticoagulants: Secondary | ICD-10-CM

## 2017-06-19 DIAGNOSIS — I480 Paroxysmal atrial fibrillation: Secondary | ICD-10-CM | POA: Diagnosis not present

## 2017-06-19 MED ORDER — METOPROLOL TARTRATE 25 MG PO TABS: ORAL_TABLET | ORAL | 3 refills | Status: DC

## 2017-06-19 NOTE — Progress Notes (Signed)
Cardiology Office Note    Date:  06/19/2017   ID:  Madison Maxwell, DOB 1920/08/28, MRN [id number]  PCP:  Rodrigo Ran, MD  Cardiologist: Dr. Swaziland (evaluated by Dr. Donnie Aho while at [facility] but wishes to follow with Dr. Swaziland as he is her husband's Cardiologist)  Chief Complaint  Patient presents with  . Hospitalization Follow-up    History of Present Illness:    Madison Maxwell is a 83 y.o. female with past medical history of HLD, prior CVA, and dementia who presents to the office today for hospital follow-up. She is accompanied by her husband and daughter.   She was recently admitted to [facility] from 7/7 - 06/07/2017 for evaluation of palpitations and was found to be in atrial fibrillation with RVR. Electrolytes and TSH were within normal limits. With her CHA2DS2-VASc Score of 5 (age (2), female, CVA (2)), she was started on Xarelto for anticoagulation. Echo showed a preserved EF of 65-70% with no WMA, mild to moderate MR, and a mildly dilated LA. She was started on BB therapy during admission and experienced conversion to NSR. She was therefore discharged on Xarelto 15mg  daily and Lopressor 25mg  BID.    In talking with the patient today, she reports overall doing well since her recent hospitalization. She denies any repeat episodes of palpitations. No recent chest discomfort, dyspnea on exertion, orthopnea, PND, lower extremity edema, lightheadedness, dizziness, or presyncope. She still resides with her husband at home and reports performing household chores daily without any symptoms.  Her husband has been following her vital signs closely and reports blood pressure has been in the 130's -140's/80's but HR has been low in the mid-40's to low-50's. She is asymptomatic with this.   She reports good compliance with her medication regimen. Denies any hematuria, melena, or hematochezia. No recent falls.    Past Medical History:  Diagnosis Date  . Allergic rhinitis   . Anxiety   .  Duodenal ulcer   . External hemorrhoid   . Hyperlipidemia   . Memory deficit   . Osteoarthritis   . Osteoporosis   . PAF (paroxysmal atrial fibrillation) (HCC)    a. diagnosed in 05/2017. Started on Xarelto for anticoagulation.   . SUI (stress urinary incontinence), female   . Vitamin D deficiency     Past Surgical History:  Procedure Laterality Date  . APPENDECTOMY    . VAGINAL HYSTERECTOMY      Current Medications: Outpatient Medications Prior to Visit  Medication Sig Dispense Refill  . atorvastatin (LIPITOR) 20 MG tablet Take 1 tablet (20 mg total) by mouth daily at 6 PM. 30 tablet 0  . cholecalciferol (VITAMIN D) 1000 units tablet Take 1,000 Units by mouth daily.    Marland Kitchen denosumab (PROLIA) 60 MG/ML SOLN injection Inject 60 mg into the skin every 6 (six) months. Administer in upper arm, thigh, or abdomen    . diphenhydramine-acetaminophen (TYLENOL PM) 25-500 MG TABS tablet Take 1 tablet by mouth at bedtime as needed (sleep).    . Multiple Vitamins-Minerals (MULTIVITAMINS THER. W/MINERALS) TABS Take 1 tablet by mouth daily.    Marland Kitchen omega-3 fish oil (MAXEPA) 1000 MG CAPS capsule Take by mouth daily.    . Rivaroxaban (XARELTO) 15 MG TABS tablet Take 1 tablet (15 mg total) by mouth daily with supper. 30 tablet 4  . rivastigmine (EXELON) 9.5 mg/24hr Place 9.5 mg onto the skin daily.     . vitamin B-12 (CYANOCOBALAMIN) 1000 MCG tablet Take 1,000 mcg by  mouth daily.    . vitamin C (ASCORBIC ACID) 500 MG tablet Take 500 mg by mouth daily.    Marland Kitchen zolpidem (AMBIEN) 5 MG tablet Take 5 mg by mouth at bedtime.   0  . metoprolol tartrate (LOPRESSOR) 25 MG tablet Take 1 tablet (25 mg total) by mouth 2 (two) times daily. 60 tablet 3   No facility-administered medications prior to visit.      Allergies:   Patient has no known allergies.   Social History   Social History  . Marital status: Married    Spouse name: N/A  . Number of children: 2  . Years of education: N/A   Occupational History  .  [employer]     Retired    Social History Main Topics  . Smoking status: Never Smoker  . Smokeless tobacco: Never Used  . Alcohol use No  . Drug use: No  . Sexual activity: Not Asked   Other Topics Concern  . None   Social History Narrative   Daily caffeine      Family History:  The patient's family history includes Colon polyps in her sister.   Review of Systems:   Please see the history of present illness.     General:  No chills, fever, night sweats or weight changes.  Cardiovascular:  No chest pain, dyspnea on exertion, edema, orthopnea, paroxysmal nocturnal dyspnea. Positive for palpitations (now resolved).  Dermatological: No rash, lesions/masses Respiratory: No cough, dyspnea Urologic: No hematuria, dysuria Abdominal:   No nausea, vomiting, diarrhea, bright red blood per rectum, melena, or hematemesis Neurologic:  No visual changes, wkns, changes in mental status. All other systems reviewed and are otherwise negative except as noted above.   Physical Exam:    VS:  BP (!) 153/61 (BP Location: Right Arm, Cuff Size: Small)   Pulse (!) 51   Ht 5\' 5"  (1.651 m)   Wt 99 lb (44.9 kg)   SpO2 99%   BMI 16.47 kg/m    General: Well developed, very pleasant, elderly Caucasian female appearing in no acute distress. Head: Normocephalic, atraumatic, sclera non-icteric, no xanthomas, nares are without discharge.  Neck: No carotid bruits. JVD not elevated.  Lungs: Respirations regular and unlabored, without wheezes or rales.  Heart: Regular rate and rhythm. No S3 or S4.  No murmur, no rubs, or gallops appreciated. Abdomen: Soft, non-tender, non-distended with normoactive bowel sounds. No hepatomegaly. No rebound/guarding. No obvious abdominal masses. Msk:  Strength and tone appear normal for age. No joint deformities or effusions. Extremities: No clubbing or cyanosis. No lower extremity edema.  Distal pedal pulses are 2+ bilaterally. Neuro: Alert and oriented X 3  (person, place, time). Moves all extremities spontaneously. No focal deficits noted. Psych:  Responds to questions appropriately with a normal affect. Skin: No rashes or lesions noted  Wt Readings from Last 3 Encounters:  06/19/17 99 lb (44.9 kg)  06/07/17 93 lb (42.2 kg)  12/23/16 101 lb 9.6 oz (46.1 kg)    Studies/Labs Reviewed:   EKG:  EKG is not ordered today.   Recent Labs: 06/06/2017: Magnesium 2.3; TSH 3.770 06/07/2017: BUN 20; Creatinine, Ser 0.62; Hemoglobin 12.9; Platelets 123; Potassium 3.8; Sodium 140   Lipid Panel    Component Value Date/Time   CHOL 193 11/10/2015 0448   TRIG 59 11/10/2015 0448   HDL 42 11/10/2015 0448   CHOLHDL 4.6 11/10/2015 0448   VLDL 12 11/10/2015 0448   LDLCALC 139 (H) 11/10/2015 0448    Additional studies/  records that were reviewed today include:   Echocardiogram: 06/06/2017 Study Conclusions  - Left ventricle: The cavity size was normal. Wall thickness was   increased in a pattern of moderate LVH. Systolic function was   vigorous. The estimated ejection fraction was in the range of 65%   to 70%. Wall motion was normal; there were no regional wall   motion abnormalities. - Aortic valve: Trileaflet; normal thickness, mildly calcified   leaflets. - Mitral valve: There was mild to moderate regurgitation. - Left atrium: The atrium was mildly dilated.  Assessment:    1. Paroxysmal atrial fibrillation (HCC)   2. Current use of long term anticoagulation   3. Dyslipidemia   4. History of CVA (cerebrovascular accident)      Plan:   In order of problems listed above:  1. Paroxysmal Atrial Fibrillation/ Use of Long-term Anticoagulation - recently admitted for evaluation of palpitations and was found to be in atrial fibrillation with RVR. Electrolytes and TSH were within normal limits. Echo showed a preserved EF with no WMA. She converted to NSR with BB therapy. No repeat episodes of palpitations or dyspnea since hospital discharge.  -  With her CHA2DS2-VASc Score of 5 (age (2), female, CVA (2)), she was started on Xarelto for anticoagulation and denies any evidence of active bleeding. Her creatinine clearance is 36 mL/hr, therefore she is on 15mg  daily dosing. While she does have an advanced age, she is functional at baseline and has not experienced any recent falls. The risks and benefits of anticoagulation were reviewed with the patient, her husband, and her daughter and they wish to continue on it at this time (her husband who is also in his 2690's is also on anticoagulation). - HR is at 51 today and her husband reports her HR has been in the mid-40's to low-50's. Will reduce Lopressor dosing from 25mg  BID to 12.5mg  BID. She may take an extra tablet as needed for recurrent palpitations. Will continue to follow BP at home with medication changes.   2. Mitral regurgitation - moderate MR noted on recent echo. - continue to follow. Would not be a candidate for any invasive procedures secondary to advanced age and dementia.   3. HLD - followed by PCP. Remains on Atorvastatin 20mg  daily.   4. History of CVA - no residual weakness. Has baseline dementia but is A&Ox3 and still lives independently at home with her husband.  Medication Adjustments/Labs and Tests Ordered: Current medicines are reviewed at length with the patient today.  Concerns regarding medicines are outlined above.  Medication changes, Labs and Tests ordered today are listed in the Patient Instructions below. Patient Instructions  Medication Instructions:  Your physician has recommended you make the following change in your medication:  1.  DECREASE the Lopressor to 1/2 tablet twice a day with taking 1 tablet as needed for palpitations  Labwork: None ordered  Testing/Procedures: None ordered  Follow-Up: Your physician recommends that you schedule a follow-up appointment in: 3 MONTHS WITH DR. SwazilandJORDAN  Any Other Special Instructions Will Be Listed Below (If  Applicable).  If you need a refill on your cardiac medications before your next appointment, please call your pharmacy.   Signed, Ellsworth LennoxBrittany M Diallo Ponder, PA-C  06/19/2017 9:03 PM    [facility] [address] [place], [place]  [address] Phone: [phone]; Fax: [phone]  [address], [address] [place], [place] [address] Phone: [phone]

## 2017-06-19 NOTE — Patient Instructions (Addendum)
Medication Instructions:  Your physician has recommended you make the following change in your medication:  1.  DECREASE the Lopressor to 1/2 tablet twice a day with taking 1 tablet as needed for palpitations   Labwork: None ordered  Testing/Procedures: None ordered  Follow-Up: Your physician recommends that you schedule a follow-up appointment in: 3 MONTHS WITH DR. SwazilandJORDAN   Any Other Special Instructions Will Be Listed Below (If Applicable).     If you need a refill on your cardiac medications before your next appointment, please call your pharmacy.

## 2017-06-22 ENCOUNTER — Emergency Department (HOSPITAL_COMMUNITY)
Admission: EM | Admit: 2017-06-22 | Discharge: 2017-06-23 | Discharge status: Absent without leave | Payer: Medicare Other

## 2017-09-19 NOTE — Progress Notes (Signed)
Cardiology Office Note    Date:  09/23/2017   ID:  Madison Maxwell, DOB February 02, 1920, MRN [id number]  PCP:  Rodrigo RanPerini, Mark, MD  Cardiologist: Dr. SwazilandJordan   Chief Complaint  Patient presents with  . Atrial Fibrillation    History of Present Illness:    Madison Maxwell is a 83 y.o. female with past medical history of HLD, prior CVA, and dementia who is seen for follow up Afib.    She was recently admitted to [facility] from 7/7 - 06/07/2017 for evaluation of palpitations and was found to be in atrial fibrillation with RVR. Electrolytes and TSH were within normal limits. With her CHA2DS2-VASc Score of 5 (age (2), female, CVA (2)), she was started on Xarelto for anticoagulation. Echo showed a preserved EF of 65-70% with no WMA, mild to moderate MR, and a mildly dilated LA. She was started on BB therapy during admission and converted to NSR. She was  discharged on Xarelto 15mg  daily and Lopressor 25mg  BID.  On her last visit Lopressor dose reduced due to bradycardia.   On follow up today she is doing well. She denies any palpitations, chest pain, dyspnea. Notes chronic back pain and states she is slowing down. Uses a walker or cane for ambulation. Weight is stable. Still lives at home with her husband. BP at home has been well controlled. HR 58-70. Denies any bleeding.    Past Medical History:  Diagnosis Date  . Allergic rhinitis   . Anxiety   . Duodenal ulcer   . External hemorrhoid   . Hyperlipidemia   . Memory deficit   . Osteoarthritis   . Osteoporosis   . PAF (paroxysmal atrial fibrillation) (HCC)    a. diagnosed in 05/2017. Started on Xarelto for anticoagulation.   . SUI (stress urinary incontinence), female   . Vitamin D deficiency     Past Surgical History:  Procedure Laterality Date  . APPENDECTOMY    . VAGINAL HYSTERECTOMY      Current Medications: Outpatient Medications Prior to Visit  Medication Sig Dispense Refill  . atorvastatin (LIPITOR) 20 MG tablet Take 1 tablet  (20 mg total) by mouth daily at 6 PM. 30 tablet 0  . cholecalciferol (VITAMIN D) 1000 units tablet Take 1,000 Units by mouth daily.    Marland Kitchen. denosumab (PROLIA) 60 MG/ML SOLN injection Inject 60 mg into the skin every 6 (six) months. Administer in upper arm, thigh, or abdomen    . metoprolol tartrate (LOPRESSOR) 25 MG tablet Take 1/2 tablet by mouth twice daily, take 1 tablet by mouth as needed for palpitations 135 tablet 3  . Multiple Vitamins-Minerals (MULTIVITAMINS THER. W/MINERALS) TABS Take 1 tablet by mouth daily.    Marland Kitchen. omega-3 fish oil (MAXEPA) 1000 MG CAPS capsule Take by mouth daily.    . Rivaroxaban (XARELTO) 15 MG TABS tablet Take 1 tablet (15 mg total) by mouth daily with supper. 30 tablet 4  . rivastigmine (EXELON) 9.5 mg/24hr Place 9.5 mg onto the skin daily.     . vitamin B-12 (CYANOCOBALAMIN) 1000 MCG tablet Take 1,000 mcg by mouth daily.    . vitamin C (ASCORBIC ACID) 500 MG tablet Take 500 mg by mouth daily.    Marland Kitchen. zolpidem (AMBIEN) 5 MG tablet Take 2.5 mg by mouth at bedtime.   0  . diphenhydramine-acetaminophen (TYLENOL PM) 25-500 MG TABS tablet Take 1 tablet by mouth at bedtime as needed (sleep).     No facility-administered medications prior to visit.  Allergies:   Patient has no known allergies.   Social History   Social History  . Marital status: Married    Spouse name: N/A  . Number of children: 2  . Years of education: N/A   Occupational History  . [employer]     Retired    Social History Main Topics  . Smoking status: Never Smoker  . Smokeless tobacco: Never Used  . Alcohol use No  . Drug use: No  . Sexual activity: Not Asked   Other Topics Concern  . None   Social History Narrative   Daily caffeine      Family History:  The patient's family history includes Colon polyps in her sister.   Review of Systems:   Please see the history of present illness.     All other systems reviewed and are otherwise negative except as noted  above.   Physical Exam:    VS:  BP 102/60   Pulse 74   Ht 5\' 5"  (1.651 m)   Wt 97 lb (44 kg)   BMI 16.14 kg/m    GENERAL:  Well appearing, thin, elderly WF in NAD HEENT:  PERRL, EOMI, sclera are clear. Oropharynx is clear. NECK:  No jugular venous distention, carotid upstroke brisk and symmetric, no bruits, no thyromegaly or adenopathy LUNGS:  Clear to auscultation bilaterally CHEST:  Unremarkable HEART:  RRR,  PMI not displaced or sustained,S1 and S2 within normal limits, no S3, no S4: no clicks, no rubs, no murmurs ABD:  Soft, nontender. BS +, no masses or bruits. No hepatomegaly, no splenomegaly EXT:  2 + pulses throughout, no edema, no cyanosis no clubbing SKIN:  Warm and dry.  No rashes NEURO:  Alert and oriented x 3. Cranial nerves II through XII intact. PSYCH:  Cognitively intact    Wt Readings from Last 3 Encounters:  09/23/17 97 lb (44 kg)  06/19/17 99 lb (44.9 kg)  06/07/17 93 lb (42.2 kg)    Studies/Labs Reviewed:   EKG:  EKG is not ordered today.   Recent Labs: 06/06/2017: Magnesium 2.3; TSH 3.770 06/07/2017: BUN 20; Creatinine, Ser 0.62; Hemoglobin 12.9; Platelets 123; Potassium 3.8; Sodium 140   Lipid Panel    Component Value Date/Time   CHOL 193 11/10/2015 0448   TRIG 59 11/10/2015 0448   HDL 42 11/10/2015 0448   CHOLHDL 4.6 11/10/2015 0448   VLDL 12 11/10/2015 0448   LDLCALC 139 (H) 11/10/2015 0448   Labs dated 06/18/17: cholesterol 132, Triglycerides 57, HDL 42, LDL 79. A1c 5.3%. CMET, CBC, TSH normal.   Additional studies/ records that were reviewed today include:   Echocardiogram: 06/06/2017 Study Conclusions  - Left ventricle: The cavity size was normal. Wall thickness was   increased in a pattern of moderate LVH. Systolic function was   vigorous. The estimated ejection fraction was in the range of 65%   to 70%. Wall motion was normal; there were no regional wall   motion abnormalities. - Aortic valve: Trileaflet; normal thickness, mildly  calcified   leaflets. - Mitral valve: There was mild to moderate regurgitation. - Left atrium: The atrium was mildly dilated.  Assessment:    1. Paroxysmal atrial fibrillation (HCC)   2. Mitral valve insufficiency, unspecified etiology   3. History of CVA (cerebrovascular accident)   4. Dyslipidemia      Plan:   In order of problems listed above:  1. Paroxysmal Atrial Fibrillation/ Use of Long-term Anticoagulation Well controlled and asymptomatic on low dose beta  blocker. Did not tolerate higher doses due to bradycardia. On Xarelto for anticoagulation with history of CVA. On lower dose due to age and low body weight.   2. Mitral regurgitation - mild-moderate MR noted on  echo. - no significant murmur on exam and she is asymptomatic.   3. HLD - followed by PCP. Remains on Atorvastatin 20mg  daily.   4. History of CVA - no residual weakness. Has baseline dementia but is A&Ox3 and still lives independently at home with her husband.  I will follow up in 6 months.   Signed, Kearra Calkin Swaziland, MD  09/23/2017 9:22 AM    [facility] [address], [address] [place], [place]  [address] Phone: [phone]; Fax: [phone]  [address], [address] [place], [place] [address] Phone: [phone]

## 2017-09-23 ENCOUNTER — Ambulatory Visit (INDEPENDENT_AMBULATORY_CARE_PROVIDER_SITE_OTHER): Payer: Medicare Other | Admitting: Cardiology

## 2017-09-23 ENCOUNTER — Encounter: Payer: Self-pay | Admitting: Cardiology

## 2017-09-23 VITALS — BP 102/60 | HR 74 | Ht 65.0 in | Wt 97.0 lb

## 2017-09-23 DIAGNOSIS — I340 Nonrheumatic mitral (valve) insufficiency: Secondary | ICD-10-CM

## 2017-09-23 DIAGNOSIS — Z8673 Personal history of transient ischemic attack (TIA), and cerebral infarction without residual deficits: Secondary | ICD-10-CM | POA: Diagnosis not present

## 2017-09-23 DIAGNOSIS — E785 Hyperlipidemia, unspecified: Secondary | ICD-10-CM

## 2017-09-23 DIAGNOSIS — I480 Paroxysmal atrial fibrillation: Secondary | ICD-10-CM

## 2017-09-23 NOTE — Patient Instructions (Signed)
Continue your current therapy  I will see you in 6 months.   

## 2017-10-04 ENCOUNTER — Observation Stay (HOSPITAL_COMMUNITY)
Admission: EM | Admit: 2017-10-04 | Discharge: 2017-10-06 | Disposition: A | Discharge status: Home / Self Care | Payer: Medicare Other | Attending: Internal Medicine | Admitting: Internal Medicine

## 2017-10-04 ENCOUNTER — Encounter (HOSPITAL_COMMUNITY): Payer: Self-pay

## 2017-10-04 ENCOUNTER — Emergency Department (HOSPITAL_COMMUNITY): Payer: Medicare Other

## 2017-10-04 DIAGNOSIS — S0083XA Contusion of other part of head, initial encounter: Secondary | ICD-10-CM | POA: Diagnosis present

## 2017-10-04 DIAGNOSIS — S0081XA Abrasion of other part of head, initial encounter: Secondary | ICD-10-CM | POA: Diagnosis not present

## 2017-10-04 DIAGNOSIS — D696 Thrombocytopenia, unspecified: Secondary | ICD-10-CM | POA: Insufficient documentation

## 2017-10-04 DIAGNOSIS — F0390 Unspecified dementia without behavioral disturbance: Secondary | ICD-10-CM | POA: Insufficient documentation

## 2017-10-04 DIAGNOSIS — I119 Hypertensive heart disease without heart failure: Secondary | ICD-10-CM | POA: Diagnosis not present

## 2017-10-04 DIAGNOSIS — S0232XA Fracture of orbital floor, left side, initial encounter for closed fracture: Secondary | ICD-10-CM | POA: Insufficient documentation

## 2017-10-04 DIAGNOSIS — S06300A Unspecified focal traumatic brain injury without loss of consciousness, initial encounter: Principal | ICD-10-CM | POA: Insufficient documentation

## 2017-10-04 DIAGNOSIS — M810 Age-related osteoporosis without current pathological fracture: Secondary | ICD-10-CM | POA: Diagnosis not present

## 2017-10-04 DIAGNOSIS — Z66 Do not resuscitate: Secondary | ICD-10-CM | POA: Insufficient documentation

## 2017-10-04 DIAGNOSIS — D649 Anemia, unspecified: Secondary | ICD-10-CM | POA: Insufficient documentation

## 2017-10-04 DIAGNOSIS — M1990 Unspecified osteoarthritis, unspecified site: Secondary | ICD-10-CM | POA: Diagnosis not present

## 2017-10-04 DIAGNOSIS — M47812 Spondylosis without myelopathy or radiculopathy, cervical region: Secondary | ICD-10-CM | POA: Diagnosis not present

## 2017-10-04 DIAGNOSIS — Z7901 Long term (current) use of anticoagulants: Secondary | ICD-10-CM | POA: Diagnosis not present

## 2017-10-04 DIAGNOSIS — Z8673 Personal history of transient ischemic attack (TIA), and cerebral infarction without residual deficits: Secondary | ICD-10-CM | POA: Diagnosis not present

## 2017-10-04 DIAGNOSIS — Z79899 Other long term (current) drug therapy: Secondary | ICD-10-CM | POA: Insufficient documentation

## 2017-10-04 DIAGNOSIS — Y9301 Activity, walking, marching and hiking: Secondary | ICD-10-CM | POA: Diagnosis not present

## 2017-10-04 DIAGNOSIS — E785 Hyperlipidemia, unspecified: Secondary | ICD-10-CM | POA: Insufficient documentation

## 2017-10-04 DIAGNOSIS — W010XXA Fall on same level from slipping, tripping and stumbling without subsequent striking against object, initial encounter: Secondary | ICD-10-CM | POA: Insufficient documentation

## 2017-10-04 DIAGNOSIS — I480 Paroxysmal atrial fibrillation: Secondary | ICD-10-CM | POA: Diagnosis not present

## 2017-10-04 DIAGNOSIS — G4700 Insomnia, unspecified: Secondary | ICD-10-CM | POA: Insufficient documentation

## 2017-10-04 DIAGNOSIS — E559 Vitamin D deficiency, unspecified: Secondary | ICD-10-CM | POA: Insufficient documentation

## 2017-10-04 DIAGNOSIS — F419 Anxiety disorder, unspecified: Secondary | ICD-10-CM | POA: Insufficient documentation

## 2017-10-04 DIAGNOSIS — S0240DA Maxillary fracture, left side, initial encounter for closed fracture: Secondary | ICD-10-CM | POA: Diagnosis not present

## 2017-10-04 DIAGNOSIS — Z90710 Acquired absence of both cervix and uterus: Secondary | ICD-10-CM | POA: Diagnosis not present

## 2017-10-04 DIAGNOSIS — I629 Nontraumatic intracranial hemorrhage, unspecified: Secondary | ICD-10-CM

## 2017-10-04 MED ORDER — SODIUM CHLORIDE 0.9 % IV SOLN
INTRAVENOUS | Status: DC
  Administered 2017-10-05: 01:00:00 via INTRAVENOUS

## 2017-10-04 NOTE — ED Provider Notes (Signed)
[facility]-EMERGENCY DEPT Provider Note   CSN: [id number] Arrival date & time: 10/04/17  1954     History   Chief Complaint Chief Complaint  Patient presents with  . Fall    HPI Jasslyn C Araceli BoucheHoneycutt is a 83 y.o. female.  Patient c/o trip and fall this afternoon.  States normally walks w walker. This afternoon, tripped, fell forward. Hit face on ground. Abrasion to left face. Tetanus 2013. Denies loc. No faintness or dizziness prior to fall.  Dull pain to area contusion. Is on xarelto, hx afib. Denies neck or back pain. No chest pain or sob. No abd pain. No nv. Denies any numbness/weakness. Has been ambulatory post fall.    The history is provided by the patient and a relative.  Fall  Pertinent negatives include no chest pain, no abdominal pain and no shortness of breath.    Past Medical History:  Diagnosis Date  . Allergic rhinitis   . Anxiety   . Duodenal ulcer   . External hemorrhoid   . Hyperlipidemia   . Memory deficit   . Osteoarthritis   . Osteoporosis   . PAF (paroxysmal atrial fibrillation) (HCC)    a. diagnosed in 05/2017. Started on Xarelto for anticoagulation.   . SUI (stress urinary incontinence), female   . Vitamin D deficiency     Patient Active Problem List   Diagnosis Date Noted  . Hypertensive heart disease without CHF 06/07/2017  . Current use of long term anticoagulation 06/07/2017  . Leukopenia 06/06/2017  . Thrombocytopenia (HCC) 06/06/2017  . Paroxysmal atrial fibrillation (HCC) 06/06/2017  . History of CVA (cerebrovascular accident) 06/06/2017  . Gait disorder 12/23/2016  . Dyslipidemia 06/17/2016  . Ataxia   . Memory deficit     Past Surgical History:  Procedure Laterality Date  . APPENDECTOMY    . VAGINAL HYSTERECTOMY      OB History    No data available       Home Medications    Prior to Admission medications   Medication Sig Start Date End Date Taking? Authorizing Provider  atorvastatin (LIPITOR) 20 MG  tablet Take 1 tablet (20 mg total) by mouth daily at 6 PM. 11/12/15   Jeralyn BennettZamora, Ezequiel, MD  cholecalciferol (VITAMIN D) 1000 units tablet Take 1,000 Units by mouth daily.    [provider]  denosumab (PROLIA) 60 MG/ML SOLN injection Inject 60 mg into the skin every 6 (six) months. Administer in upper arm, thigh, or abdomen    [provider]  metoprolol tartrate (LOPRESSOR) 25 MG tablet Take 1/2 tablet by mouth twice daily, take 1 tablet by mouth as needed for palpitations 06/19/17   Iran OuchStrader, GrenadaBrittany M, PA-C  Multiple Vitamins-Minerals (MULTIVITAMINS THER. W/MINERALS) TABS Take 1 tablet by mouth daily.    [provider]  omega-3 fish oil (MAXEPA) 1000 MG CAPS capsule Take by mouth daily.    [provider]  Rivaroxaban (XARELTO) 15 MG TABS tablet Take 1 tablet (15 mg total) by mouth daily with supper. 06/07/17   Rai, Delene Ruffiniipudeep K, MD  rivastigmine (EXELON) 9.5 mg/24hr Place 9.5 mg onto the skin daily.  08/27/15   [provider]  vitamin B-12 (CYANOCOBALAMIN) 1000 MCG tablet Take 1,000 mcg by mouth daily.    [provider]  vitamin C (ASCORBIC ACID) 500 MG tablet Take 500 mg by mouth daily.    [provider]  zolpidem (AMBIEN) 5 MG tablet Take 2.5 mg by mouth at bedtime.  12/11/15  [provider]    Family History Family History  Problem Relation Age of Onset  . Colon polyps Sister   . Colon cancer Neg Hx     Social History Social History   Tobacco Use  . Smoking status: Never Smoker  . Smokeless tobacco: Never Used  Substance Use Topics  . Alcohol use: No  . Drug use: No     Allergies   Patient has no known allergies.   Review of Systems Review of Systems  Constitutional: Negative for fever.  HENT: Positive for nosebleeds.   Eyes: Negative for pain.  Respiratory: Negative for shortness of breath.   Cardiovascular: Negative for chest pain.  Gastrointestinal: Negative for abdominal pain and vomiting.    Genitourinary: Negative for flank pain.  Musculoskeletal: Negative for back pain.  Skin: Positive for wound.  Neurological: Negative for weakness and numbness.  Hematological: Does not bruise/bleed easily.  Psychiatric/Behavioral: Negative for confusion.     Physical Exam Updated Vital Signs BP (!) 156/61 (BP Location: Left Arm)   Pulse 74   Temp 98.3 F (36.8 C) (Oral)   Resp 18   Ht 1.651 m (5\' 5" )   Wt 44 kg (97 lb)   SpO2 99%   BMI 16.14 kg/m   Physical Exam  Constitutional: She appears well-developed and well-nourished. No distress.  HENT:  Contusion and abrasion left face.   Eyes: Conjunctivae are normal. Pupils are equal, round, and reactive to light. No scleral icterus.  Neck: Neck supple. No tracheal deviation present.  Cardiovascular: Normal rate, normal heart sounds and intact distal pulses.  Pulmonary/Chest: Effort normal and breath sounds normal. No respiratory distress. She exhibits no tenderness.  Abdominal: Normal appearance. She exhibits no distension. There is no tenderness.  Musculoskeletal: She exhibits no edema.  Mild cervical tenderness, otherwise, CTLS spine, non tender, aligned, no step off. No focal bony tenderness on extremity exam.   Neurological: She is alert.  Speech clear. Motor intact bil. Gait at baseline per pt.   Skin: Skin is warm and dry. No rash noted. She is not diaphoretic.  Psychiatric: She has a normal mood and affect.  Nursing note and vitals reviewed.    ED Treatments / Results  Labs (all labs ordered are listed, but only abnormal results are displayed) Labs Reviewed - No data to display  EKG  EKG Interpretation None       Radiology Ct Head Wo Contrast  Result Date: 10/04/2017 CLINICAL DATA:  Ataxia, head trauma EXAM: CT HEAD WITHOUT CONTRAST CT CERVICAL SPINE WITHOUT CONTRAST TECHNIQUE: Multidetector CT imaging of the head and cervical spine was performed following the standard protocol without intravenous  contrast. Multiplanar CT image reconstructions of the cervical spine were also generated. COMPARISON:  01/03/2016, MRI 11/10/2015, CT 10/25/2012 FINDINGS: CT HEAD FINDINGS Brain: No acute territorial infarction or intracranial mass is visualized. Atrophy and small vessel ischemic changes of the white matter. Old lacunar infarct in the right white matter. Slightly enlarged ventricles, likely due to atrophy. Small hyperdense focus either within or immediately adjacent to the posterior aspect of the right lateral ventricle. No midline shift. Vascular: No hyperdense vessels.  Carotid artery calcifications. Skull: No depressed skull fracture is seen. Sinuses/Orbits: Hemorrhage in the left maxillary sinus. Minimally displaced acute fracture involving the left lateral wall of the maxillary sinus. Coronal views demonstrate a mildly depressed left orbital floor fracture, no definite herniation of the inferior rectus muscle. Other: None CT CERVICAL SPINE FINDINGS Alignment: Reversal of cervical lordosis. No subluxation.  Facet alignment is within normal limits. Skull base and vertebrae: No acute fracture. No primary bone lesion or focal pathologic process. Soft tissues and spinal canal: No prevertebral fluid or swelling. No visible canal hematoma. Disc levels:  Marked degenerative changes C5 through T1 Upper chest: Apical scarring.  Multiple hypodense thyroid nodules Other: Coronal views demonstrate comminuted fracture involving the inferior, lateral aspect of left maxillary sinus with adjacent soft tissue gas IMPRESSION: 1. Hyperdense focus that appears to localize to the posterior aspect of the right lateral ventricle and is suspicious for a small amount of hemorrhage. No midline shift. Atrophy and small vessel ischemic changes of the white matter 2. Degenerative changes of the cervical spine without acute fracture 3. Partially visualized acute mildly displaced fractures involving the left inferolateral and posterior wall of  the left maxillary sinus with sinus hemorrhage. Mildly displaced left orbital floor fracture. Critical Value/emergent results were called by telephone at the time of interpretation on 10/04/2017 at 11:05 pm to Dr. Denton Lank, who verbally acknowledged these results. Electronically Signed   By: Jasmine Pang M.D.   On: 10/04/2017 23:05   Ct Cervical Spine Wo Contrast  Result Date: 10/04/2017 CLINICAL DATA:  Ataxia, head trauma EXAM: CT HEAD WITHOUT CONTRAST CT CERVICAL SPINE WITHOUT CONTRAST TECHNIQUE: Multidetector CT imaging of the head and cervical spine was performed following the standard protocol without intravenous contrast. Multiplanar CT image reconstructions of the cervical spine were also generated. COMPARISON:  01/03/2016, MRI 11/10/2015, CT 10/25/2012 FINDINGS: CT HEAD FINDINGS Brain: No acute territorial infarction or intracranial mass is visualized. Atrophy and small vessel ischemic changes of the white matter. Old lacunar infarct in the right white matter. Slightly enlarged ventricles, likely due to atrophy. Small hyperdense focus either within or immediately adjacent to the posterior aspect of the right lateral ventricle. No midline shift. Vascular: No hyperdense vessels.  Carotid artery calcifications. Skull: No depressed skull fracture is seen. Sinuses/Orbits: Hemorrhage in the left maxillary sinus. Minimally displaced acute fracture involving the left lateral wall of the maxillary sinus. Coronal views demonstrate a mildly depressed left orbital floor fracture, no definite herniation of the inferior rectus muscle. Other: None CT CERVICAL SPINE FINDINGS Alignment: Reversal of cervical lordosis. No subluxation. Facet alignment is within normal limits. Skull base and vertebrae: No acute fracture. No primary bone lesion or focal pathologic process. Soft tissues and spinal canal: No prevertebral fluid or swelling. No visible canal hematoma. Disc levels:  Marked degenerative changes C5 through T1 Upper  chest: Apical scarring.  Multiple hypodense thyroid nodules Other: Coronal views demonstrate comminuted fracture involving the inferior, lateral aspect of left maxillary sinus with adjacent soft tissue gas IMPRESSION: 1. Hyperdense focus that appears to localize to the posterior aspect of the right lateral ventricle and is suspicious for a small amount of hemorrhage. No midline shift. Atrophy and small vessel ischemic changes of the white matter 2. Degenerative changes of the cervical spine without acute fracture 3. Partially visualized acute mildly displaced fractures involving the left inferolateral and posterior wall of the left maxillary sinus with sinus hemorrhage. Mildly displaced left orbital floor fracture. Critical Value/emergent results were called by telephone at the time of interpretation on 10/04/2017 at 11:05 pm to Dr. Denton Lank, who verbally acknowledged these results. Electronically Signed   By: Jasmine Pang M.D.   On: 10/04/2017 23:05    Procedures Procedures (including critical care time)  Medications Ordered in ED Medications - No data to display   Initial Impression / Assessment and Plan / ED Course  I have reviewed the triage vital signs and the nursing notes.  Pertinent labs & imaging results that were available during my care of the patient were reviewed by me and considered in my medical decision making (see chart for details).  Imaging studies ordered.  Reviewed nursing notes and prior charts for additional history.   Pt declines any pain med.   Ct shows facial fx and small amt blood in right ventricle.  Discussed with NS, Dr Lovell Sheehan - indicates pt could be admitted to medical service and have repeat imaging tomorrow. Labs added.   Hospitalist consulted.     Final Clinical Impressions(s) / ED Diagnoses   Final diagnoses:  None    ED Discharge Orders    None       Cathren Laine, MD 10/05/17 0001

## 2017-10-04 NOTE — Discharge Instructions (Signed)
It was our pleasure to provide your ER care today - we hope that you feel better.  Keep abrasions clean/dry.   Take tylenol as need.  Return to ER if worse, new symptoms, new or severe pain, other concern.

## 2017-10-04 NOTE — ED Triage Notes (Signed)
Fall about 1600 pm today with laceration noted at left temple area with with left naris bleeding controlled at this time alert and oriented x 3.

## 2017-10-05 ENCOUNTER — Other Ambulatory Visit: Payer: Self-pay

## 2017-10-05 ENCOUNTER — Encounter (HOSPITAL_COMMUNITY): Payer: Self-pay | Admitting: Internal Medicine

## 2017-10-05 DIAGNOSIS — I629 Nontraumatic intracranial hemorrhage, unspecified: Secondary | ICD-10-CM

## 2017-10-05 DIAGNOSIS — S06300A Unspecified focal traumatic brain injury without loss of consciousness, initial encounter: Secondary | ICD-10-CM | POA: Diagnosis not present

## 2017-10-05 DIAGNOSIS — S0083XA Contusion of other part of head, initial encounter: Secondary | ICD-10-CM

## 2017-10-05 DIAGNOSIS — I480 Paroxysmal atrial fibrillation: Secondary | ICD-10-CM | POA: Diagnosis not present

## 2017-10-05 DIAGNOSIS — S0232XA Fracture of orbital floor, left side, initial encounter for closed fracture: Secondary | ICD-10-CM | POA: Diagnosis not present

## 2017-10-05 DIAGNOSIS — W010XXA Fall on same level from slipping, tripping and stumbling without subsequent striking against object, initial encounter: Secondary | ICD-10-CM

## 2017-10-05 DIAGNOSIS — S0081XA Abrasion of other part of head, initial encounter: Secondary | ICD-10-CM | POA: Diagnosis not present

## 2017-10-05 DIAGNOSIS — S0240DA Maxillary fracture, left side, initial encounter for closed fracture: Secondary | ICD-10-CM | POA: Diagnosis not present

## 2017-10-05 LAB — BASIC METABOLIC PANEL
ANION GAP: 9 (ref 5–15)
Anion gap: 9 (ref 5–15)
BUN: 18 mg/dL (ref 6–20)
BUN: 14 mg/dL (ref 6–20)
CALCIUM: 9.2 mg/dL (ref 8.9–10.3)
CO2: 26 mmol/L (ref 22–32)
CO2: 28 mmol/L (ref 22–32)
Calcium: 9 mg/dL (ref 8.9–10.3)
Chloride: 106 mmol/L (ref 101–111)
Chloride: 105 mmol/L (ref 101–111)
Creatinine, Ser: 0.41 mg/dL — ABNORMAL LOW (ref 0.44–1.00)
Creatinine, Ser: 0.45 mg/dL (ref 0.44–1.00)
GFR calc Af Amer: >60 mL/min (ref >60)
GFR calc non Af Amer: >60 mL/min (ref >60)
GLUCOSE: 103 mg/dL — AB (ref 65–99)
Glucose, Bld: 103 mg/dL — ABNORMAL HIGH (ref 65–99)
POTASSIUM: 3.7 mmol/L (ref 3.5–5.1)
POTASSIUM: 3.9 mmol/L (ref 3.5–5.1)
Sodium: 141 mmol/L (ref 135–145)
Sodium: 142 mmol/L (ref 135–145)

## 2017-10-05 LAB — CBC
HEMATOCRIT: 35.8 % — AB (ref 36.0–46.0)
HEMATOCRIT: 36.1 % (ref 36.0–46.0)
HEMOGLOBIN: 12.1 g/dL (ref 12.0–15.0)
Hemoglobin: 11.8 g/dL — ABNORMAL LOW (ref 12.0–15.0)
MCH: 31.5 pg (ref 26.0–34.0)
MCH: 32.2 pg (ref 26.0–34.0)
MCHC: 33 g/dL (ref 30.0–36.0)
MCHC: 33.5 g/dL (ref 30.0–36.0)
MCV: 96 fL (ref 78.0–100.0)
MCV: 95.5 fL (ref 78.0–100.0)
Platelets: 107 10*3/uL — ABNORMAL LOW (ref 150–400)
Platelets: 110 10*3/uL — ABNORMAL LOW (ref 150–400)
RBC: 3.75 MIL/uL — ABNORMAL LOW (ref 3.87–5.11)
RBC: 3.76 MIL/uL — AB (ref 3.87–5.11)
RDW: 12.7 % (ref 11.5–15.5)
RDW: 12.8 % (ref 11.5–15.5)
WBC: 5.8 10*3/uL (ref 4.0–10.5)
WBC: 5.9 10*3/uL (ref 4.0–10.5)

## 2017-10-05 LAB — ABO/RH: ABO/RH(D): A POS

## 2017-10-05 LAB — TYPE AND SCREEN
ABO/RH(D): A POS
Antibody Screen: NEGATIVE

## 2017-10-05 MED ORDER — RIVASTIGMINE 9.5 MG/24HR TD PT24
9.5000 mg | MEDICATED_PATCH | Freq: Every day | TRANSDERMAL | Status: DC
  Administered 2017-10-05 – 2017-10-06 (×2): 9.5 mg via TRANSDERMAL
  Filled 2017-10-05 (×3): qty 1

## 2017-10-05 MED ORDER — VITAMIN B-12 1000 MCG PO TABS
1000.0000 ug | ORAL_TABLET | Freq: Every day | ORAL | Status: DC
  Administered 2017-10-05 – 2017-10-06 (×2): 1000 ug via ORAL
  Filled 2017-10-05 (×2): qty 1

## 2017-10-05 MED ORDER — ONDANSETRON HCL 4 MG PO TABS: 4.0000 mg | ORAL_TABLET | Freq: Four times a day (QID) | ORAL | Status: DC | PRN

## 2017-10-05 MED ORDER — METOPROLOL TARTRATE 25 MG PO TABS
12.5000 mg | ORAL_TABLET | Freq: Every evening | ORAL | Status: DC
  Administered 2017-10-05: 12.5 mg via ORAL
  Filled 2017-10-05: qty 1

## 2017-10-05 MED ORDER — ACETAMINOPHEN 325 MG PO TABS: 650.0000 mg | ORAL_TABLET | Freq: Four times a day (QID) | ORAL | Status: DC | PRN

## 2017-10-05 MED ORDER — ZOLPIDEM TARTRATE 5 MG PO TABS
2.5000 mg | ORAL_TABLET | Freq: Every day | ORAL | Status: DC
  Administered 2017-10-05: 2.5 mg via ORAL
  Filled 2017-10-05: qty 1

## 2017-10-05 MED ORDER — ATORVASTATIN CALCIUM 20 MG PO TABS
20.0000 mg | ORAL_TABLET | Freq: Every day | ORAL | Status: DC
  Administered 2017-10-05: 20 mg via ORAL
  Filled 2017-10-05: qty 1

## 2017-10-05 MED ORDER — VITAMIN D3 25 MCG (1000 UNIT) PO TABS
1000.0000 [IU] | ORAL_TABLET | Freq: Every day | ORAL | Status: DC
  Administered 2017-10-05 – 2017-10-06 (×2): 1000 [IU] via ORAL
  Filled 2017-10-05 (×2): qty 1

## 2017-10-05 MED ORDER — ONDANSETRON HCL 4 MG/2ML IJ SOLN: 4.0000 mg | Freq: Four times a day (QID) | INTRAMUSCULAR | Status: DC | PRN

## 2017-10-05 MED ORDER — ADULT MULTIVITAMIN W/MINERALS CH
1.0000 | ORAL_TABLET | Freq: Every evening | ORAL | Status: DC
  Administered 2017-10-05: 1 via ORAL
  Filled 2017-10-05: qty 1

## 2017-10-05 MED ORDER — VITAMIN C 500 MG PO TABS
500.0000 mg | ORAL_TABLET | Freq: Every day | ORAL | Status: DC
  Administered 2017-10-05 – 2017-10-06 (×2): 500 mg via ORAL
  Filled 2017-10-05 (×2): qty 1

## 2017-10-05 MED ORDER — ACETAMINOPHEN 650 MG RE SUPP: 650.0000 mg | Freq: Four times a day (QID) | RECTAL | Status: DC | PRN

## 2017-10-05 MED ORDER — OMEGA-3-ACID ETHYL ESTERS 1 G PO CAPS
1.0000 g | ORAL_CAPSULE | Freq: Every day | ORAL | Status: DC
  Administered 2017-10-05 – 2017-10-06 (×2): 1 g via ORAL
  Filled 2017-10-05 (×2): qty 1

## 2017-10-05 NOTE — Progress Notes (Signed)
Patient seen and examined. Admitted after midnight secondary to mechanical fall at home. Found to have small intracranial bleed. Patient with hx of A. Fib on xarelto. Hemodynamically stable. Case discussed with neurosurgery who recommended observation and repeat CT head in am. PT also involved to assess patient functionality and balance. Please refer to H&P written by Dr. Toniann FailKakrakandy for further info/details on admission.  Madison Maxwell, Madison Stucki MD [phone]

## 2017-10-05 NOTE — H&P (Signed)
History and Physical    Madison Maxwell ZOX:[id number] DOB: 16-Dec-1919 DOA: 10/04/2017  PCP: Rodrigo RanPerini, Mark, MD  Patient coming from: Home.  Chief Complaint: Fall.  HPI: Madison Maxwell is a 83 y.o. female with history of atrial fibrillation and stroke on Xarelto presents to the ER after patient had a fall.  Patient states last time she was walking on the pavement when she suddenly fell.  She states she fell face forward.  Denies losing consciousness.  After the fall patient had epistaxis for about 2 hours.  Patient was brought to the ER.  ED Course: In the ER CT head and C-spine were done which shows intracranial hemorrhage and left orbital floor and maxillary sinus fracture.  On-call neurosurgeon Dr. Lovell SheehanJenkins was consulted and has requested to hold off the old 2 and repeat CT head to make sure there is no further worsening of bleeding.  Moderately built and nourished..  Review of Systems: As per HPI, rest all negative.   Past Medical History:  Diagnosis Date  . Allergic rhinitis   . Anxiety   . Duodenal ulcer   . External hemorrhoid   . Hyperlipidemia   . Memory deficit   . Osteoarthritis   . Osteoporosis   . PAF (paroxysmal atrial fibrillation) (HCC)    a. diagnosed in 05/2017. Started on Xarelto for anticoagulation.   . SUI (stress urinary incontinence), female   . Vitamin D deficiency     Past Surgical History:  Procedure Laterality Date  . APPENDECTOMY    . VAGINAL HYSTERECTOMY       reports that  has never smoked. she has never used smokeless tobacco. She reports that she does not drink alcohol or use drugs.  No Known Allergies  Family History  Problem Relation Age of Onset  . Colon polyps Sister   . Colon cancer Neg Hx     Prior to Admission medications   Medication Sig Start Date End Date Taking? Authorizing Provider  atorvastatin (LIPITOR) 20 MG tablet Take 1 tablet (20 mg total) by mouth daily at 6 PM. 11/12/15  Yes Jeralyn BennettZamora, Ezequiel, MD  cholecalciferol  (VITAMIN D) 1000 units tablet Take 1,000 Units daily with breakfast by mouth.    Yes [provider]  denosumab (PROLIA) 60 MG/ML SOLN injection Inject 60 mg into the skin every 6 (six) months. Administer in upper arm, thigh, or abdomen   Yes [provider]  metoprolol tartrate (LOPRESSOR) 25 MG tablet Take 1/2 tablet by mouth twice daily, take 1 tablet by mouth as needed for palpitations Patient taking differently: Take 12.5 mg every evening by mouth. Take 1/2 tablet by mouth twice daily, take 1 tablet by mouth as needed for palpitation 06/19/17  Yes Strader, GrenadaBrittany M, PA-C  Multiple Vitamins-Minerals (MULTIVITAMINS THER. W/MINERALS) TABS Take 1 tablet every evening by mouth.    Yes [provider]  omega-3 fish oil (MAXEPA) 1000 MG CAPS capsule Take 1 capsule daily by mouth.    Yes [provider]  Probiotic Product (PROBIOTIC DAILY) CAPS Take 1 capsule daily at 6 PM by mouth.   Yes [provider]  Rivaroxaban (XARELTO) 15 MG TABS tablet Take 1 tablet (15 mg total) by mouth daily with supper. 06/07/17  Yes Rai, Ripudeep K, MD  rivastigmine (EXELON) 9.5 mg/24hr Place 9.5 mg onto the skin daily.  08/27/15  Yes [provider]  vitamin B-12 (CYANOCOBALAMIN) 1000 MCG tablet Take 1,000 mcg by mouth daily.   Yes [provider]  vitamin C (ASCORBIC ACID) 500 MG tablet Take 500 mg by mouth daily.   Yes [provider]  zolpidem (AMBIEN) 5 MG tablet Take 2.5 mg by mouth at bedtime.  12/11/15  Yes [provider]    Physical Exam: Vitals:   10/04/17 2013 10/04/17 2314 10/05/17 0030 10/05/17 0135  BP:  (!) 160/61 (!) 151/73 (!) 160/66  Pulse:  82 66 69  Resp:  18  16  Temp:    98 F (36.7 C)  TempSrc:    Oral  SpO2:  97% 99% 100%  Weight: 44 kg (97 lb)   44.7 kg (98 lb 9.6 oz)  Height: 5\' 5"  (1.651 m)   5\' 5"  (1.651 m)      Constitutional: Moderately built and nourished. Vitals:   10/04/17 2013 10/04/17 2314  10/05/17 0030 10/05/17 0135  BP:  (!) 160/61 (!) 151/73 (!) 160/66  Pulse:  82 66 69  Resp:  18  16  Temp:    98 F (36.7 C)  TempSrc:    Oral  SpO2:  97% 99% 100%  Weight: 44 kg (97 lb)   44.7 kg (98 lb 9.6 oz)  Height: 5\' 5"  (1.651 m)   5\' 5"  (1.651 m)   Eyes: Mild periorbital ecchymosis on the left side. ENMT: No discharge from the ears eyes nose or mouth. Neck: No mass felt.  No neck rigidity. Respiratory: No rhonchi or crepitations. Cardiovascular: S1-S2 heard no murmurs appreciated. Abdomen: Soft nontender bowel sounds present. Musculoskeletal: No edema.  No joint effusion. Skin: No rash.  Skin appears warm. Neurologic: Alert awake oriented to time place and person.  Moves all extremities. Psychiatric: Appears normal.  Normal affect.   Labs on Admission: I have personally reviewed following labs and imaging studies  CBC: Recent Labs  Lab 10/05/17 0050  WBC 5.8  HGB 12.1  HCT 36.1  MCV 96.0  PLT 107*   Basic Metabolic Panel: Recent Labs  Lab 10/05/17 0050  NA 141  K 3.9  CL 106  CO2 26  GLUCOSE 103*  BUN 18  CREATININE 0.41*  CALCIUM 9.2   GFR: Estimated Creatinine Clearance: 29.7 mL/min (A) (by C-G formula based on SCr of 0.41 mg/dL (L)). Liver Function Tests: No results for input(s): AST, ALT, ALKPHOS, BILITOT, PROT, ALBUMIN in the last 168 hours. No results for input(s): LIPASE, AMYLASE in the last 168 hours. No results for input(s): AMMONIA in the last 168 hours. Coagulation Profile: No results for input(s): INR, PROTIME in the last 168 hours. Cardiac Enzymes: No results for input(s): CKTOTAL, CKMB, CKMBINDEX, TROPONINI in the last 168 hours. BNP (last 3 results) No results for input(s): PROBNP in the last 8760 hours. HbA1C: No results for input(s): HGBA1C in the last 72 hours. CBG: No results for input(s): GLUCAP in the last 168 hours. Lipid Profile: No results for input(s): CHOL, HDL, LDLCALC, TRIG, CHOLHDL, LDLDIRECT in the last 72  hours. Thyroid Function Tests: No results for input(s): TSH, T4TOTAL, FREET4, T3FREE, THYROIDAB in the last 72 hours. Anemia Panel: No results for input(s): VITAMINB12, FOLATE, FERRITIN, TIBC, IRON, RETICCTPCT in the last 72 hours. Urine analysis:    Component Value Date/Time   COLORURINE STRAW (A) 06/06/2017 1748   APPEARANCEUR CLEAR 06/06/2017 1748   LABSPEC 1.006 06/06/2017 1748   PHURINE 9.0 (H) 06/06/2017 1748   GLUCOSEU NEGATIVE 06/06/2017 1748   HGBUR NEGATIVE 06/06/2017 1748   BILIRUBINUR NEGATIVE 06/06/2017 1748   KETONESUR NEGATIVE 06/06/2017 1748   PROTEINUR NEGATIVE 06/06/2017  1748   NITRITE NEGATIVE 06/06/2017 1748   LEUKOCYTESUR NEGATIVE 06/06/2017 1748   Sepsis Labs: @LABRCNTIP (procalcitonin:4,lacticidven:4) )No results found for this or any previous visit (from the past 240 hour(s)).   Radiological Exams on Admission: Ct Head Wo Contrast  Result Date: 10/04/2017 CLINICAL DATA:  Ataxia, head trauma EXAM: CT HEAD WITHOUT CONTRAST CT CERVICAL SPINE WITHOUT CONTRAST TECHNIQUE: Multidetector CT imaging of the head and cervical spine was performed following the standard protocol without intravenous contrast. Multiplanar CT image reconstructions of the cervical spine were also generated. COMPARISON:  01/03/2016, MRI 11/10/2015, CT 10/25/2012 FINDINGS: CT HEAD FINDINGS Brain: No acute territorial infarction or intracranial mass is visualized. Atrophy and small vessel ischemic changes of the white matter. Old lacunar infarct in the right white matter. Slightly enlarged ventricles, likely due to atrophy. Small hyperdense focus either within or immediately adjacent to the posterior aspect of the right lateral ventricle. No midline shift. Vascular: No hyperdense vessels.  Carotid artery calcifications. Skull: No depressed skull fracture is seen. Sinuses/Orbits: Hemorrhage in the left maxillary sinus. Minimally displaced acute fracture involving the left lateral wall of the maxillary  sinus. Coronal views demonstrate a mildly depressed left orbital floor fracture, no definite herniation of the inferior rectus muscle. Other: None CT CERVICAL SPINE FINDINGS Alignment: Reversal of cervical lordosis. No subluxation. Facet alignment is within normal limits. Skull base and vertebrae: No acute fracture. No primary bone lesion or focal pathologic process. Soft tissues and spinal canal: No prevertebral fluid or swelling. No visible canal hematoma. Disc levels:  Marked degenerative changes C5 through T1 Upper chest: Apical scarring.  Multiple hypodense thyroid nodules Other: Coronal views demonstrate comminuted fracture involving the inferior, lateral aspect of left maxillary sinus with adjacent soft tissue gas IMPRESSION: 1. Hyperdense focus that appears to localize to the posterior aspect of the right lateral ventricle and is suspicious for a small amount of hemorrhage. No midline shift. Atrophy and small vessel ischemic changes of the white matter 2. Degenerative changes of the cervical spine without acute fracture 3. Partially visualized acute mildly displaced fractures involving the left inferolateral and posterior wall of the left maxillary sinus with sinus hemorrhage. Mildly displaced left orbital floor fracture. Critical Value/emergent results were called by telephone at the time of interpretation on 10/04/2017 at 11:05 pm to Dr. Denton Lank, who verbally acknowledged these results. Electronically Signed   By: Jasmine Pang M.D.   On: 10/04/2017 23:05   Ct Cervical Spine Wo Contrast  Result Date: 10/04/2017 CLINICAL DATA:  Ataxia, head trauma EXAM: CT HEAD WITHOUT CONTRAST CT CERVICAL SPINE WITHOUT CONTRAST TECHNIQUE: Multidetector CT imaging of the head and cervical spine was performed following the standard protocol without intravenous contrast. Multiplanar CT image reconstructions of the cervical spine were also generated. COMPARISON:  01/03/2016, MRI 11/10/2015, CT 10/25/2012 FINDINGS: CT HEAD  FINDINGS Brain: No acute territorial infarction or intracranial mass is visualized. Atrophy and small vessel ischemic changes of the white matter. Old lacunar infarct in the right white matter. Slightly enlarged ventricles, likely due to atrophy. Small hyperdense focus either within or immediately adjacent to the posterior aspect of the right lateral ventricle. No midline shift. Vascular: No hyperdense vessels.  Carotid artery calcifications. Skull: No depressed skull fracture is seen. Sinuses/Orbits: Hemorrhage in the left maxillary sinus. Minimally displaced acute fracture involving the left lateral wall of the maxillary sinus. Coronal views demonstrate a mildly depressed left orbital floor fracture, no definite herniation of the inferior rectus muscle. Other: None CT CERVICAL SPINE FINDINGS Alignment: Reversal of  cervical lordosis. No subluxation. Facet alignment is within normal limits. Skull base and vertebrae: No acute fracture. No primary bone lesion or focal pathologic process. Soft tissues and spinal canal: No prevertebral fluid or swelling. No visible canal hematoma. Disc levels:  Marked degenerative changes C5 through T1 Upper chest: Apical scarring.  Multiple hypodense thyroid nodules Other: Coronal views demonstrate comminuted fracture involving the inferior, lateral aspect of left maxillary sinus with adjacent soft tissue gas IMPRESSION: 1. Hyperdense focus that appears to localize to the posterior aspect of the right lateral ventricle and is suspicious for a small amount of hemorrhage. No midline shift. Atrophy and small vessel ischemic changes of the white matter 2. Degenerative changes of the cervical spine without acute fracture 3. Partially visualized acute mildly displaced fractures involving the left inferolateral and posterior wall of the left maxillary sinus with sinus hemorrhage. Mildly displaced left orbital floor fracture. Critical Value/emergent results were called by telephone at the time  of interpretation on 10/04/2017 at 11:05 pm to Dr. Denton Lank, who verbally acknowledged these results. Electronically Signed   By: Jasmine Pang M.D.   On: 10/04/2017 23:05      Assessment/Plan Principal Problem:   Intracranial bleeding [facility]) Active Problems:   Paroxysmal atrial fibrillation (HCC)   History of CVA (cerebrovascular accident)   Fall from slip, trip, or stumble, initial encounter   Contusion of face    1. Intracranial bleed with left orbital floor fracture and left maxillary fracture -on-call neurosurgeon Dr. Lovell Sheehan was consulted who requested repeat CT head to make sure no further worsening of the intracranial bleed.  Holding off Xarelto for now.  Physical therapy consult.  Will request trauma surgery input on the maxillary and orbital fracture. 2. History of A. fib presently rate controlled.  Xarelto on hold due to bleed.  On metoprolol for rate control. 3. History of embolic CVA.  Holding Xarelto.  Continue statins. 4. History of dementia on Exelon. 5. History of chronic anemia and thrombocytopenia.  I have reviewed patient's old charts and labs. EKG is pending.   DVT prophylaxis: SCDs. Code Status: DNR. Family Communication: Patient's husband. Disposition Plan: Home. Consults called: ER physician discussed with neurosurgeon. Admission status: Observation.   Eduard Clos MD [facility] Pager [phone].  If 7PM-7AM, please contact night-coverage [email or web address] Password [email or web address]  10/05/2017, 2:11 AM

## 2017-10-05 NOTE — Progress Notes (Signed)
   10/05/17 1629  Clinical Encounter Type  Visited With Patient and family together  Visit Type Initial  Referral From Nurse  Consult/Referral To Chaplain   [facility]  Patient and family member indicated the patient has a living will so did not need one.  Short visit, patient doing well and ready to go home.

## 2017-10-05 NOTE — Evaluation (Signed)
Physical Therapy Evaluation Patient Details Name: Madison Maxwell MRN: [id number] DOB: July 25, 1920 Today's Date: 10/05/2017   History of Present Illness  83 y.o. female with history of dementia, osteoporosis, atrial fibrillation and stroke on Xarelto presents to the ER after patient had a fall.  Pt found to have Intracranial bleed with left orbital floor fracture and left maxillary fracture.  Clinical Impression  Pt admitted with above diagnosis. Pt currently with functional limitations due to the deficits listed below (see PT Problem List).  Pt will benefit from skilled PT to increase their independence and safety with mobility to allow discharge to the venue listed below.   Pt reports being very independent at home however her spouse is always present for outdoor or community experiences.  Pt was picking up sticks in the yard when she fell (did not have assistive device at the time).  Pt ambulated around the unit with a RW and no symptoms.  Pt plans to d/c back home as soon as possible.  Daughter, spouse and pt discussing possible HH services.     Follow Up Recommendations Home health PT;Supervision for mobility/OOB    Equipment Recommendations  None recommended by PT    Recommendations for Other Services       Precautions / Restrictions Precautions Precautions: Fall      Mobility  Bed Mobility               General bed mobility comments: pt up in recliner on arrival  Transfers Overall transfer level: Needs assistance Equipment used: Rolling walker (2 wheeled) Transfers: Sit to/from Stand Sit to Stand: Min guard         General transfer comment: min/guard for safety  Ambulation/Gait Ambulation/Gait assistance: Min guard Ambulation Distance (Feet): 400 Feet Assistive device: Rolling walker (2 wheeled) Gait Pattern/deviations: Step-through pattern;Decreased stride length     General Gait Details: slow but steady gait with RW, pt denies any pain, reports she is  happy to be up and walking  Stairs            Wheelchair Mobility    Modified Rankin (Stroke Patients Only)       Balance Overall balance assessment: History of Falls                                           Pertinent Vitals/Pain Pain Assessment: No/denies pain Pain Intervention(s): Repositioned;Monitored during session    Home Living Family/patient expects to be discharged to:: Private residence Living Arrangements: Spouse/significant other Available Help at Discharge: Family;Available 24 hours/day Type of Home: House Home Access: Stairs to enter Entrance Stairs-Rails: None Entrance Stairs-Number of Steps: 1 Home Layout: One level Home Equipment: Walker - 2 wheels;Cane - single point;Bedside commode;Shower seat;Hand held shower head      Prior Function Level of Independence: Independent with assistive device(s);Needs assistance   Gait / Transfers Assistance Needed: Uses cane/RW for gait     Comments: pt and spouse are typically always together     Hand Dominance        Extremity/Trunk Assessment        Lower Extremity Assessment Lower Extremity Assessment: Generalized weakness       Communication   Communication: HOH  Cognition Arousal/Alertness: Awake/alert Behavior During Therapy: WFL for tasks assessed/performed Overall Cognitive Status: Within Functional Limits for tasks assessed  General Comments      Exercises     Assessment/Plan    PT Assessment Patient needs continued PT services  PT Problem List Decreased strength;Decreased mobility;Decreased balance;Decreased knowledge of use of DME;Decreased safety awareness       PT Treatment Interventions DME instruction;Therapeutic activities;Gait training;Therapeutic exercise;Patient/family education;Balance training;Stair training;Functional mobility training    PT Goals (Current goals can be found in the Care  Plan section)  Acute Rehab PT Goals PT Goal Formulation: With patient/family Time For Goal Achievement: 10/12/17 Potential to Achieve Goals: Good    Frequency Min 3X/week   Barriers to discharge        Co-evaluation               AM-PAC PT "6 Clicks" Daily Activity  Outcome Measure Difficulty turning over in bed (including adjusting bedclothes, sheets and blankets)?: None Difficulty moving from lying on back to sitting on the side of the bed? : None Difficulty sitting down on and standing up from a chair with arms (e.g., wheelchair, bedside commode, etc,.)?: A Little Help needed moving to and from a bed to chair (including a wheelchair)?: A Little Help needed walking in hospital room?: A Little Help needed climbing 3-5 steps with a railing? : A Little 6 Click Score: 20    End of Session Equipment Utilized During Treatment: Gait belt Activity Tolerance: Patient tolerated treatment well Patient left: in chair;with call bell/phone within reach;with family/visitor present Nurse Communication: Mobility status PT Visit Diagnosis: Difficulty in walking, not elsewhere classified (R26.2)    Time: 1610-9604 PT Time Calculation (min) (ACUTE ONLY): 23 min   Charges:   PT Evaluation $PT Eval Low Complexity: 1 Low     PT G Codes:   PT G-Codes **NOT FOR INPATIENT CLASS** Functional Assessment Tool Used: AM-PAC 6 Clicks Basic Mobility;Clinical judgement Functional Limitation: Mobility: Walking and moving around Mobility: Walking and Moving Around Current Status (V4098): At least 20 percent but less than 40 percent impaired, limited or restricted Mobility: Walking and Moving Around Goal Status [phone]): At least 1 percent but less than 20 percent impaired, limited or restricted    Zenovia Jarred, PT, DPT 10/05/2017 Pager: [phone]   Maida Sale E 10/05/2017, 1:17 PM

## 2017-10-05 NOTE — ED Notes (Signed)
Call report to Sonya [phone]

## 2017-10-05 NOTE — Progress Notes (Signed)
[facility] Moon also explained to pt and daughter at bedside.

## 2017-10-06 ENCOUNTER — Observation Stay (HOSPITAL_COMMUNITY): Payer: Medicare Other

## 2017-10-06 ENCOUNTER — Encounter (HOSPITAL_COMMUNITY): Payer: Self-pay | Admitting: Radiology

## 2017-10-06 DIAGNOSIS — S0083XD Contusion of other part of head, subsequent encounter: Secondary | ICD-10-CM

## 2017-10-06 DIAGNOSIS — Z8673 Personal history of transient ischemic attack (TIA), and cerebral infarction without residual deficits: Secondary | ICD-10-CM

## 2017-10-06 DIAGNOSIS — I480 Paroxysmal atrial fibrillation: Secondary | ICD-10-CM

## 2017-10-06 DIAGNOSIS — I629 Nontraumatic intracranial hemorrhage, unspecified: Secondary | ICD-10-CM | POA: Diagnosis not present

## 2017-10-06 LAB — CBC
HEMATOCRIT: 35.6 % — AB (ref 36.0–46.0)
HEMOGLOBIN: 11.9 g/dL — AB (ref 12.0–15.0)
MCH: 31.5 pg (ref 26.0–34.0)
MCHC: 33.4 g/dL (ref 30.0–36.0)
MCV: 94.2 fL (ref 78.0–100.0)
Platelets: 117 10*3/uL — ABNORMAL LOW (ref 150–400)
RBC: 3.78 MIL/uL — AB (ref 3.87–5.11)
RDW: 12.8 % (ref 11.5–15.5)
WBC: 4.3 10*3/uL (ref 4.0–10.5)

## 2017-10-06 LAB — BASIC METABOLIC PANEL
ANION GAP: 8 (ref 5–15)
BUN: 21 mg/dL — ABNORMAL HIGH (ref 6–20)
CHLORIDE: 104 mmol/L (ref 101–111)
CO2: 27 mmol/L (ref 22–32)
Calcium: 9.1 mg/dL (ref 8.9–10.3)
Creatinine, Ser: 0.51 mg/dL (ref 0.44–1.00)
GFR calc Af Amer: >60 mL/min (ref >60)
GFR calc non Af Amer: >60 mL/min (ref >60)
GLUCOSE: 108 mg/dL — AB (ref 65–99)
POTASSIUM: 3.8 mmol/L (ref 3.5–5.1)
Sodium: 139 mmol/L (ref 135–145)

## 2017-10-06 MED ORDER — ACETAMINOPHEN 325 MG PO TABS: 650.0000 mg | ORAL_TABLET | Freq: Four times a day (QID) | ORAL | 0 refills | Status: DC | PRN

## 2017-10-06 NOTE — Discharge Summary (Signed)
Physician Discharge Summary  Cheron Everyva C Karstens WUJ:[id number] DOB: [birth year] DOA: 10/04/2017  PCP: Rodrigo RanPerini, Mark, MD  Admit date: 10/04/2017 Discharge date: 10/06/2017  Time spent: 35 minutes  Recommendations for Outpatient Follow-up:  1. Repeat CBC to follow platelets and Hgb trend  2. Re-discuss long term anticoagulation and risks of future intracranial bleeding.   Discharge Diagnoses:  Principal Problem:   Intracranial bleeding [facility](HCC) Active Problems:   Paroxysmal atrial fibrillation (HCC)   History of CVA (cerebrovascular accident)   Fall from slip, trip, or stumble, initial encounter   Contusion of face   Discharge Condition: stable and improved. [facility] and instructions to follow up in 10 days with PCP.  Diet recommendation: heart healthy diet   Filed Weights   10/04/17 2013 10/05/17 0135  Weight: 44 kg (97 lb) 44.7 kg (98 lb 9.6 oz)    History of present illness:  83 y.o. female with history of atrial fibrillation and stroke on Xarelto presents to the ER after patient had a fall. No loss of consciousness, CP, palpitations or any prodromic complaints prior to fall. She expressed essentially tripping and falling. after fall she had nasal bleed for approx 2 hours and was brought to ED for further evaluation and treatment.   Hospital Course:  1-fall/intracranial bleeding -mechanical fall and no abnormalities seen on telemetry or by PT assessment. -will stop xarelto at least for 3 weeks; even I personally believed she might not be a good candidate for further anticoagulation. -patient. Family and PCP to have final decisions about anticoagulation topic -repeat CT scan stable and no showing midline shifting or any other abnormalities  2-hx of A, Fib: paroxysmal -rate controled and sinus currently -continue metoprolol  3-hx of CVA: -unable to use anticoagulation at this point -continue statins and BP control -no neurologic deficit  appreciated -risk of stroke discussed with patient's daughter   4-hx of dementia: -no behavioral disturbances appreciated -will continue exelon Patch  5-hx of chronic anemia and thrombocytopenia -stable overall -follow repeat CBC at follow up to assess platelets and Hgb trend   6-HTN -continue metoprolol  7-HLD -continue lipitor  8-insomnia -continue PRN zolpidem   Procedures: See below for x-ray reports  Consultations:  Neurosurgery (Dr. Lovell SheehanJenkins) was curbside at time of admission and recommended observation and repeat CT head in am.  Discharge Exam: Vitals:   10/05/17 2208 10/06/17 0525  BP: (!) 145/75 112/64  Pulse: 68 75  Resp: 16 16  Temp: 97.9 F (36.6 C) 97.8 F (36.6 C)  SpO2: 99% 98%    General: afebrile, no CP, no SOB, no nausea, no vomiting. Patient with intact motor and neurologic exam; face bruising healing appropriately and no complaining of pain currently. Cardiovascular: S1 and S2, no rubs, no gallops Respiratory: good air movement, no wheezing, no crackles Abd: soft, NT, ND, positive BS Extremities: no edema, no cyanosis  Neurologic exam: AAOX2, CN intact, no focal motor deficit appreciated, patient able to properly follow commands.  Discharge Instructions   Discharge Instructions    Diet - low sodium heart healthy   Complete by:  As directed    Discharge instructions   Complete by:  As directed    Take medications as prescribed  Follow up with PCP in 10 days Maintain adequate hydration  Please avoid the use of NSAID's No xarelto for at least the next 3 weeks; discussed with PCP final decision about future treatment.     Current Discharge Medication List    START  taking these medications   Details  acetaminophen (TYLENOL) 325 MG tablet Take 2 tablets (650 mg total) every 6 (six) hours as needed by mouth for mild pain or headache (or Fever >/= 101). Qty: 40 tablet, Refills: 0      CONTINUE these medications which have NOT CHANGED    Details  atorvastatin (LIPITOR) 20 MG tablet Take 1 tablet (20 mg total) by mouth daily at 6 PM. Qty: 30 tablet, Refills: 0    cholecalciferol (VITAMIN D) 1000 units tablet Take 1,000 Units daily with breakfast by mouth.     denosumab (PROLIA) 60 MG/ML SOLN injection Inject 60 mg into the skin every 6 (six) months. Administer in upper arm, thigh, or abdomen    metoprolol tartrate (LOPRESSOR) 25 MG tablet Take 1/2 tablet by mouth twice daily, take 1 tablet by mouth as needed for palpitations Qty: 135 tablet, Refills: 3    Multiple Vitamins-Minerals (MULTIVITAMINS THER. W/MINERALS) TABS Take 1 tablet every evening by mouth.     omega-3 fish oil (MAXEPA) 1000 MG CAPS capsule Take 1 capsule daily by mouth.     Probiotic Product (PROBIOTIC DAILY) CAPS Take 1 capsule daily at 6 PM by mouth.    rivastigmine (EXELON) 9.5 mg/24hr Place 9.5 mg onto the skin daily.     vitamin B-12 (CYANOCOBALAMIN) 1000 MCG tablet Take 1,000 mcg by mouth daily.    vitamin C (ASCORBIC ACID) 500 MG tablet Take 500 mg by mouth daily.    zolpidem (AMBIEN) 5 MG tablet Take 2.5 mg by mouth at bedtime.  Refills: 0      STOP taking these medications     Rivaroxaban (XARELTO) 15 MG TABS tablet        No Known Allergies Follow-up Information    Rodrigo Ran, MD. Schedule an appointment as soon as possible for a visit in 10 day(s).   Specialty:  Internal Medicine Contact information: [address] [place] [address] [phone]           The results of significant diagnostics from this hospitalization (including imaging, microbiology, ancillary and laboratory) are listed below for reference.    Significant Diagnostic Studies: Ct Head Wo Contrast  Result Date: 10/06/2017 CLINICAL DATA:  83 year old female fell several days ago. Follow-up intracranial hemorrhage. Subsequent encounter. EXAM: CT HEAD WITHOUT CONTRAST TECHNIQUE: Contiguous axial images were obtained from the base of the skull through  the vertex without intravenous contrast. COMPARISON:  10/04/2017 and 01/03/2016 head CT. FINDINGS: Brain: 7 x 6 x 9 mm focal hemorrhage along the superior margin of the atrium right lateral ventricle unchanged. No new intracranial hemorrhage noted. Remote right corona radiata infarct. No CT evidence of large acute thrombotic infarct. Chronic microvascular changes. No intracranial mass lesion noted on this unenhanced exam. Atrophy Vascular: Vascular calcifications Skull: No skull fracture. Sinuses/Orbits: Left maxillary sinus and left orbital floor fracture incompletely visualized. Other: None. IMPRESSION: 7 x 6 x 9 mm focal hemorrhage along the superior margin of the atrium right lateral ventricle unchanged. No new intracranial hemorrhage noted. Left maxillary sinus and left orbital floor fracture incompletely visualized. Electronically Signed   By: Lacy Duverney M.D.   On: 10/06/2017 10:13   Ct Head Wo Contrast  Result Date: 10/04/2017 CLINICAL DATA:  Ataxia, head trauma EXAM: CT HEAD WITHOUT CONTRAST CT CERVICAL SPINE WITHOUT CONTRAST TECHNIQUE: Multidetector CT imaging of the head and cervical spine was performed following the standard protocol without intravenous contrast. Multiplanar CT image reconstructions of the cervical spine were also generated. COMPARISON:  01/03/2016, MRI 11/10/2015, CT 10/25/2012 FINDINGS: CT HEAD FINDINGS Brain: No acute territorial infarction or intracranial mass is visualized. Atrophy and small vessel ischemic changes of the white matter. Old lacunar infarct in the right white matter. Slightly enlarged ventricles, likely due to atrophy. Small hyperdense focus either within or immediately adjacent to the posterior aspect of the right lateral ventricle. No midline shift. Vascular: No hyperdense vessels.  Carotid artery calcifications. Skull: No depressed skull fracture is seen. Sinuses/Orbits: Hemorrhage in the left maxillary sinus. Minimally displaced acute fracture involving the  left lateral wall of the maxillary sinus. Coronal views demonstrate a mildly depressed left orbital floor fracture, no definite herniation of the inferior rectus muscle. Other: None CT CERVICAL SPINE FINDINGS Alignment: Reversal of cervical lordosis. No subluxation. Facet alignment is within normal limits. Skull base and vertebrae: No acute fracture. No primary bone lesion or focal pathologic process. Soft tissues and spinal canal: No prevertebral fluid or swelling. No visible canal hematoma. Disc levels:  Marked degenerative changes C5 through T1 Upper chest: Apical scarring.  Multiple hypodense thyroid nodules Other: Coronal views demonstrate comminuted fracture involving the inferior, lateral aspect of left maxillary sinus with adjacent soft tissue gas IMPRESSION: 1. Hyperdense focus that appears to localize to the posterior aspect of the right lateral ventricle and is suspicious for a small amount of hemorrhage. No midline shift. Atrophy and small vessel ischemic changes of the white matter 2. Degenerative changes of the cervical spine without acute fracture 3. Partially visualized acute mildly displaced fractures involving the left inferolateral and posterior wall of the left maxillary sinus with sinus hemorrhage. Mildly displaced left orbital floor fracture. Critical Value/emergent results were called by telephone at the time of interpretation on 10/04/2017 at 11:05 pm to Dr. Denton Lank, who verbally acknowledged these results. Electronically Signed   By: Jasmine Pang M.D.   On: 10/04/2017 23:05   Ct Cervical Spine Wo Contrast  Result Date: 10/04/2017 CLINICAL DATA:  Ataxia, head trauma EXAM: CT HEAD WITHOUT CONTRAST CT CERVICAL SPINE WITHOUT CONTRAST TECHNIQUE: Multidetector CT imaging of the head and cervical spine was performed following the standard protocol without intravenous contrast. Multiplanar CT image reconstructions of the cervical spine were also generated. COMPARISON:  01/03/2016, MRI 11/10/2015,  CT 10/25/2012 FINDINGS: CT HEAD FINDINGS Brain: No acute territorial infarction or intracranial mass is visualized. Atrophy and small vessel ischemic changes of the white matter. Old lacunar infarct in the right white matter. Slightly enlarged ventricles, likely due to atrophy. Small hyperdense focus either within or immediately adjacent to the posterior aspect of the right lateral ventricle. No midline shift. Vascular: No hyperdense vessels.  Carotid artery calcifications. Skull: No depressed skull fracture is seen. Sinuses/Orbits: Hemorrhage in the left maxillary sinus. Minimally displaced acute fracture involving the left lateral wall of the maxillary sinus. Coronal views demonstrate a mildly depressed left orbital floor fracture, no definite herniation of the inferior rectus muscle. Other: None CT CERVICAL SPINE FINDINGS Alignment: Reversal of cervical lordosis. No subluxation. Facet alignment is within normal limits. Skull base and vertebrae: No acute fracture. No primary bone lesion or focal pathologic process. Soft tissues and spinal canal: No prevertebral fluid or swelling. No visible canal hematoma. Disc levels:  Marked degenerative changes C5 through T1 Upper chest: Apical scarring.  Multiple hypodense thyroid nodules Other: Coronal views demonstrate comminuted fracture involving the inferior, lateral aspect of left maxillary sinus with adjacent soft tissue gas IMPRESSION: 1. Hyperdense focus that appears to localize to the posterior aspect of the right lateral ventricle  and is suspicious for a small amount of hemorrhage. No midline shift. Atrophy and small vessel ischemic changes of the white matter 2. Degenerative changes of the cervical spine without acute fracture 3. Partially visualized acute mildly displaced fractures involving the left inferolateral and posterior wall of the left maxillary sinus with sinus hemorrhage. Mildly displaced left orbital floor fracture. Critical Value/emergent results were  called by telephone at the time of interpretation on 10/04/2017 at 11:05 pm to Dr. Denton LankSteinl, who verbally acknowledged these results. Electronically Signed   By: Jasmine PangKim  Fujinaga M.D.   On: 10/04/2017 23:05    Labs: Basic Metabolic Panel: Recent Labs  Lab 10/05/17 0050 10/05/17 0504 10/06/17 0503  NA 141 142 139  K 3.9 3.7 3.8  CL 106 105 104  CO2 26 28 27   GLUCOSE 103* 103* 108*  BUN 18 14 21*  CREATININE 0.41* 0.45 0.51  CALCIUM 9.2 9.0 9.1   CBC: Recent Labs  Lab 10/05/17 0050 10/05/17 0504 10/06/17 0503  WBC 5.8 5.9 4.3  HGB 12.1 11.8* 11.9*  HCT 36.1 35.8* 35.6*  MCV 96.0 95.5 94.2  PLT 107* 110* 117*    Signed:  Vassie LollMadera, Amaris Garrette MD.  [facility] 10/06/2017, 1:39 PM

## 2017-10-09 ENCOUNTER — Telehealth: Payer: Self-pay | Admitting: Cardiology

## 2017-10-09 NOTE — Telephone Encounter (Signed)
Returned the call to the home nurse. She wanted to clarify that the patient should be taking Metoprolol 12.5 mg tablet bid. According to the records this is correct. She believes that the patient may have been taking it once daily. She will call the patient to clarify the correct dosage.

## 2017-10-09 NOTE — Telephone Encounter (Signed)
New message   Home health nurse Madison Maxwell is calling to speak to rn to confirm pt medication instructions for her Madison Maxwell   Pt c/o medication issue:  1. Name of Medication: Madison Maxwell   2. How are you currently taking this medication (dosage and times per day)? 25mg  half tablet 2 a day and 2 as needed for palpitations/ also 1 a day  3. Are you having a reaction (difficulty breathing--STAT)?   4. What is your medication issue? Nurse has discharge from hospital medication instructions and Dr.jordan instructions are different please call and confirm

## 2017-10-13 ENCOUNTER — Telehealth: Payer: Self-pay | Admitting: Cardiology

## 2017-10-13 NOTE — Telephone Encounter (Signed)
Please call,question about her Lopressor.Needs clarification on how she should be taking it.

## 2017-10-13 NOTE — Telephone Encounter (Signed)
Spoke to ITT IndustriesHattie RN with [facility].She wanted to verify metoprolol dose.Stated husband has been giving wife metoprolol tart 25 mg 1/2 tablet every pm since 05/2017.Advised 10/06/17 discharge summary- metoprolol tart 25 mg 1/2 tablet twice a day and may take extra 25 mg if needed for palpitations.

## 2017-10-18 ENCOUNTER — Telehealth: Payer: Self-pay | Admitting: Adult Health

## 2017-10-18 NOTE — Telephone Encounter (Signed)
Phone call from Tommy RainwaterMargie Watkins, caregiver for patient.  Been stating that her blood pressures have been extremely low 80s systolic, with associated dizziness.  The patient recently was started on metoprolol 25 mg twice daily, she had been on 12.5 mg daily when in ER was found that she was to be on the higher dose.  They checked with our office and was advised to have her take 25 mg twice a day.  Since that time she is felt weak and dizzy.  I have advised her to not take her metoprolol this afternoon.  She will decrease her dose back to 12.5 mg daily.  I have given them parameters to hold it if her blood pressure is less than 95/50.  Will call the office if she continues to be hypotensive.  Of note, she was recently started on sulfa antibiotic for UTI.  She is just started this 10/14/2017.

## 2017-10-19 ENCOUNTER — Telehealth: Payer: Self-pay | Admitting: Cardiology

## 2017-10-19 NOTE — Telephone Encounter (Signed)
Will need to be called for follow up on hypotension with Toprol. May need appointment. Per Bailey MechKatherine Lawrence.

## 2017-10-19 NOTE — Telephone Encounter (Signed)
Patient called and spoke with Dalbert MayotteKathryn L PA yesterday regarding hypotension. Received message today to call and follow up with patient. Left message to call back

## 2017-10-20 NOTE — Telephone Encounter (Signed)
Spoke with patients husband and patient had office visit with Dr Waynard EdwardsPerini today. Dr Waynard EdwardsPerini changed Metoprolol to 25 mg 1/2 tablet daily and is discontinuing the Ambien. He also wanted patient to continue off Xarelto until repeat head scan in few weeks. Patient did have low blood pressures over weekend. Patient blood pressure was 80's/40's when home health out. She was dehydrated and and UTI per daughter. Systolic blood pressure yesterday and today running in the 120's and heart rate 60's-70's. Did advise ok to take an extra 1/2 Metoprolol for palpitations or fast heart rate if SBP above 110 but if needing to do frequently to call office.

## 2017-10-20 NOTE — Telephone Encounter (Signed)
Follow up    Pt husband is returning Haw[place]orn Woods[place]inda call

## 2017-10-20 NOTE — Telephone Encounter (Signed)
Agree with recommendations  Peter Jordan MD, FACC  

## 2017-10-27 ENCOUNTER — Other Ambulatory Visit: Payer: Self-pay | Admitting: Internal Medicine

## 2017-10-27 DIAGNOSIS — I629 Nontraumatic intracranial hemorrhage, unspecified: Secondary | ICD-10-CM

## 2017-10-29 ENCOUNTER — Other Ambulatory Visit (HOSPITAL_COMMUNITY): Payer: Self-pay

## 2017-10-29 ENCOUNTER — Other Ambulatory Visit (HOSPITAL_COMMUNITY): Payer: Self-pay | Admitting: *Deleted

## 2017-10-30 ENCOUNTER — Ambulatory Visit (HOSPITAL_COMMUNITY)
Admission: RE | Admit: 2017-10-30 | Discharge: 2017-10-30 | Disposition: A | Discharge status: Home / Self Care | Payer: Medicare Other | Source: Ambulatory Visit | Attending: Internal Medicine | Admitting: Internal Medicine

## 2017-10-30 DIAGNOSIS — M810 Age-related osteoporosis without current pathological fracture: Secondary | ICD-10-CM | POA: Diagnosis not present

## 2017-10-30 MED ORDER — CLONIDINE HCL 0.1 MG PO TABS
ORAL_TABLET | ORAL | Status: AC
  Filled 2017-10-30: qty 1

## 2017-10-30 MED ORDER — DENOSUMAB 60 MG/ML ~~LOC~~ SOLN
60.0000 mg | Freq: Once | SUBCUTANEOUS | Status: AC
  Administered 2017-10-30: 60 mg via SUBCUTANEOUS
  Filled 2017-10-30: qty 1

## 2017-11-02 ENCOUNTER — Ambulatory Visit
Admission: RE | Admit: 2017-11-02 | Discharge: 2017-11-02 | Disposition: A | Discharge status: Home / Self Care | Payer: Medicare Other | Source: Ambulatory Visit | Attending: Internal Medicine | Admitting: Internal Medicine

## 2017-11-02 DIAGNOSIS — I629 Nontraumatic intracranial hemorrhage, unspecified: Secondary | ICD-10-CM

## 2017-12-15 ENCOUNTER — Encounter: Payer: Self-pay | Admitting: Cardiology

## 2017-12-24 NOTE — Progress Notes (Signed)
Cardiology Office Note    Date:  12/31/2017   ID:  Madison Maxwell, DOB [birth year], MRN [id number]  PCP:  Rodrigo Ran, MD  Cardiologist: Dr. [place]   Chief Complaint  Patient presents with  . Atrial Fibrillation    History of Present Illness:    Madison Maxwell is a 84 y.o. female with past medical history of HLD, prior CVA, and dementia who is seen for follow up Afib.    She was  admitted to [facility] from 7/7 - 06/07/2017 for evaluation of palpitations and was found to be in atrial fibrillation with RVR. Electrolytes and TSH were within normal limits. With her CHA2DS2-VASc Score of 5 (age (2), female, CVA (2)), she was started on Xarelto for anticoagulation. Echo showed a preserved EF of 65-70% with no WMA, mild to moderate MR, and a mildly dilated LA. She was started on BB therapy during admission and converted to NSR. She was  discharged on Xarelto 15mg  daily and Lopressor 25mg  BID.  On her last visit Lopressor dose reduced due to bradycardia.   She was admitted in November 2018 with a mechanical fall and facial/nose bleeding. CT showed a small area of hemorrhage in the right lateral ventricle. Xarelto held and recommended she hold this for a minimum of 3 weeks prior to follow up with primary care. Follow up CT in December showed resolution of hemorrhage.   She is seen today with her husband. She is still weak- "legs just wont go". Otherwise denies any chest pain, palpitations, dizziness, SOB. No HA. Xarelto not resumed.   Past Medical History:  Diagnosis Date  . Allergic rhinitis   . Anxiety   . Duodenal ulcer   . External hemorrhoid   . Hyperlipidemia   . Memory deficit   . Osteoarthritis   . Osteoporosis   . PAF (paroxysmal atrial fibrillation) (HCC)    a. diagnosed in 05/2017. Started on Xarelto for anticoagulation.   . SUI (stress urinary incontinence), female   . Vitamin D deficiency     Past Surgical History:  Procedure Laterality Date  . APPENDECTOMY    .  VAGINAL HYSTERECTOMY      Current Medications: Outpatient Medications Prior to Visit  Medication Sig Dispense Refill  . acetaminophen (TYLENOL) 325 MG tablet Take 2 tablets (650 mg total) every 6 (six) hours as needed by mouth for mild pain or headache (or Fever >/= 101). 40 tablet 0  . cholecalciferol (VITAMIN D) 1000 units tablet Take 1,000 Units daily with breakfast by mouth.     . denosumab (PROLIA) 60 MG/ML SOLN injection Inject 60 mg into the skin every 6 (six) months. Administer in upper arm, thigh, or abdomen    . metoprolol tartrate (LOPRESSOR) 25 MG tablet Take 25 mg by mouth as directed. 1/2 tablet by mouth daily    . Multiple Vitamins-Minerals (MULTIVITAMINS THER. W/MINERALS) TABS Take 1 tablet every evening by mouth.     . omega-3 fish oil (MAXEPA) 1000 MG CAPS capsule Take 1 capsule daily by mouth.     . Probiotic Product (PROBIOTIC DAILY) CAPS Take 1 capsule daily at 6 PM by mouth.    . rivastigmine (EXELON) 9.5 mg/24hr Place 9.5 mg onto the skin daily.     . vitamin B-12 (CYANOCOBALAMIN) 1000 MCG tablet Take 1,000 mcg by mouth daily.    . vitamin C (ASCORBIC ACID) 500 MG tablet Take 500 mg by mouth daily.    Marland Kitchen atorvastatin (LIPITOR) 20 MG tablet Take 1  tablet (20 mg total) by mouth daily at 6 PM. 30 tablet 0   No facility-administered medications prior to visit.      Allergies:   Patient has no known allergies.   Social History   Socioeconomic History  . Marital status: Married    Spouse name: None  . Number of children: 2  . Years of education: None  . Highest education level: None  Social Needs  . Financial resource strain: None  . Food insecurity - worry: None  . Food insecurity - inability: None  . Transportation needs - medical: None  . Transportation needs - non-medical: None  Occupational History  . Occupation: Animator    Comment: Retired   Tobacco Use  . Smoking status: Never Smoker  . Smokeless tobacco: Never Used  Substance and Sexual  Activity  . Alcohol use: No  . Drug use: No  . Sexual activity: None  Other Topics Concern  . None  Social History Narrative   Daily caffeine      Family History:  The patient's family history includes Colon polyps in her sister.   Review of Systems:   Please see the history of present illness.     All other systems reviewed and are otherwise negative except as noted above.   Physical Exam:    VS:  BP 104/62   Pulse 72   Ht 5\' 5"  (1.651 m)   Wt 99 lb 9.6 oz (45.2 kg)   BMI 16.57 kg/m    GENERAL:  Well appearing, thin, elderly WF in NAD HEENT:  PERRL, EOMI, sclera are clear. Oropharynx is clear. NECK:  No jugular venous distention, carotid upstroke brisk and symmetric, no bruits, no thyromegaly or adenopathy LUNGS:  Clear to auscultation bilaterally CHEST:  Unremarkable HEART:  RRR,  PMI not displaced or sustained,S1 and S2 within normal limits, no S3, no S4: no clicks, no rubs, no murmurs ABD:  Soft, nontender. BS +, no masses or bruits. No hepatomegaly, no splenomegaly EXT:  2 + pulses throughout, no edema, no cyanosis no clubbing SKIN:  Warm and dry.  No rashes NEURO:  Alert and oriented x 3. Cranial nerves II through XII intact. PSYCH:  Cognitively intact      Wt Readings from Last 3 Encounters:  12/31/17 99 lb 9.6 oz (45.2 kg)  10/05/17 98 lb 9.6 oz (44.7 kg)  09/23/17 97 lb (44 kg)    Studies/Labs Reviewed:   EKG:  EKG is not ordered today.   Recent Labs: 06/06/2017: Magnesium 2.3; TSH 3.770 10/06/2017: BUN 21; Creatinine, Ser 0.51; Hemoglobin 11.9; Platelets 117; Potassium 3.8; Sodium 139   Lipid Panel    Component Value Date/Time   CHOL 193 11/10/2015 0448   TRIG 59 11/10/2015 0448   HDL 42 11/10/2015 0448   CHOLHDL 4.6 11/10/2015 0448   VLDL 12 11/10/2015 0448   LDLCALC 139 (H) 11/10/2015 0448   Labs dated 06/18/17: cholesterol 132, Triglycerides 57, HDL 42, LDL 79. A1c 5.3%. CMET, CBC, TSH normal.  Dated 12/23/17: A1c 5.5%  Additional studies/  records that were reviewed today include:   Echocardiogram: 06/06/2017 Study Conclusions  - Left ventricle: The cavity size was normal. Wall thickness was   increased in a pattern of moderate LVH. Systolic function was   vigorous. The estimated ejection fraction was in the range of 65%   to 70%. Wall motion was normal; there were no regional wall   motion abnormalities. - Aortic valve: Trileaflet; normal thickness, mildly calcified  leaflets. - Mitral valve: There was mild to moderate regurgitation. - Left atrium: The atrium was mildly dilated.  Assessment:    1. Paroxysmal atrial fibrillation (HCC)   2. History of CVA (cerebrovascular accident)   3. Dyslipidemia      Plan:   In order of problems listed above:  1. Paroxysmal Atrial Fibrillation/ Use of Long-term Anticoagulation She has only had one episode of Afib that we know of. Maintaining NSR. Xarelto discontinued due to fall and ICH. She is at high risk for recurrent falls. Discussed with husband risk/benefit of anticoagulation versus bleeding. At this point will opt to leave off anticoagulation.   2. Mitral regurgitation - mild-moderate MR noted on  echo. - no significant murmur on exam and she is asymptomatic.   3. HLD - followed by PCP. Remains on Atorvastatin 20mg  daily. Excellent control.   4. History of CVA  5. History of cranial trauma from mechanical fall with small amount of intraventricular hemorrhage- resolved.  I will follow up in one year   Signed, Walton Digilio SwazilandJordan, MD  12/31/2017 3:08 PM    [facility] [address], [address] [place], [place]  [address] Phone: [phone]; Fax: [phone]  [address], [address] [place], [place] [address] Phone: [phone]

## 2017-12-31 ENCOUNTER — Ambulatory Visit: Payer: Medicare Other | Admitting: Cardiology

## 2017-12-31 ENCOUNTER — Encounter: Payer: Self-pay | Admitting: Cardiology

## 2017-12-31 VITALS — BP 104/62 | HR 72 | Ht 65.0 in | Wt 99.6 lb

## 2017-12-31 DIAGNOSIS — I480 Paroxysmal atrial fibrillation: Secondary | ICD-10-CM | POA: Diagnosis not present

## 2017-12-31 DIAGNOSIS — Z8673 Personal history of transient ischemic attack (TIA), and cerebral infarction without residual deficits: Secondary | ICD-10-CM

## 2017-12-31 DIAGNOSIS — E785 Hyperlipidemia, unspecified: Secondary | ICD-10-CM | POA: Diagnosis not present

## 2017-12-31 NOTE — Patient Instructions (Signed)
Continue your current therapy  I will see you in one year   

## 2018-04-06 ENCOUNTER — Other Ambulatory Visit (HOSPITAL_COMMUNITY): Payer: Self-pay | Admitting: *Deleted

## 2018-04-07 ENCOUNTER — Ambulatory Visit (HOSPITAL_COMMUNITY)
Admission: RE | Admit: 2018-04-07 | Discharge: 2018-04-07 | Disposition: A | Discharge status: Home / Self Care | Payer: Medicare Other | Source: Ambulatory Visit | Attending: Internal Medicine | Admitting: Internal Medicine

## 2018-04-07 DIAGNOSIS — M810 Age-related osteoporosis without current pathological fracture: Secondary | ICD-10-CM | POA: Diagnosis not present

## 2018-04-07 MED ORDER — DENOSUMAB 60 MG/ML ~~LOC~~ SOSY
60.0000 mg | PREFILLED_SYRINGE | Freq: Once | SUBCUTANEOUS | Status: AC
  Administered 2018-04-07: 60 mg via SUBCUTANEOUS
  Filled 2018-04-07: qty 1

## 2018-08-17 ENCOUNTER — Emergency Department (HOSPITAL_COMMUNITY)
Admission: EM | Admit: 2018-08-17 | Discharge: 2018-08-17 | Disposition: A | Discharge status: Home / Self Care | Payer: Medicare Other | Attending: Emergency Medicine | Admitting: Emergency Medicine

## 2018-08-17 ENCOUNTER — Encounter (HOSPITAL_COMMUNITY): Payer: Self-pay | Admitting: Emergency Medicine

## 2018-08-17 ENCOUNTER — Other Ambulatory Visit: Payer: Self-pay

## 2018-08-17 DIAGNOSIS — I480 Paroxysmal atrial fibrillation: Secondary | ICD-10-CM | POA: Insufficient documentation

## 2018-08-17 DIAGNOSIS — K0889 Other specified disorders of teeth and supporting structures: Secondary | ICD-10-CM | POA: Insufficient documentation

## 2018-08-17 DIAGNOSIS — I10 Essential (primary) hypertension: Secondary | ICD-10-CM | POA: Diagnosis not present

## 2018-08-17 MED ORDER — PENICILLIN V POTASSIUM 500 MG PO TABS: 500.0000 mg | ORAL_TABLET | Freq: Four times a day (QID) | ORAL | 0 refills | Status: AC

## 2018-08-17 NOTE — ED Notes (Signed)
ED Provider at bedside. 

## 2018-08-17 NOTE — Discharge Instructions (Signed)
Call either your dentist or one of the dentists offices provided in your instructions to schedule an appointment for re-evaluation and further management within the next 48 hours.   I have prescribed you Penicillin VK which is an antibiotic to treat the infection   Please take all of your antibiotics until finished. You may develop abdominal discomfort or diarrhea from the antibiotic.  You may help offset this with probiotics which you can buy at the store (ask your pharmacist if unable to find) or get probiotics in the form of eating yogurt. Do not eat or take the probiotics until 2 hours after your antibiotic. If you are unable to tolerate these side effects follow-up with your primary care provider or return to the emergency department.   If you begin to experience any blistering, rashes, swelling, or difficulty breathing seek medical care for evaluation of potentially more serious side effects.   Please continue to take Tylenol per over the counter dosing instructions.   Please be aware that these medications may interact with other medications you are taking, please be sure to discuss your medication list with your pharmacist.  If you start to experience and new or worsening symptoms return to the emergency department. If you start to experience fever, chills, neck stiffness/pain, or inability to move your neck or open your mouth come back to the emergency department immediately.

## 2018-08-17 NOTE — ED Provider Notes (Signed)
[facility]-EMERGENCY DEPT Provider Note   CSN: [id number] Arrival date & time: 08/17/18  4098   History   Chief Complaint Chief Complaint  Patient presents with  . Dental Pain    HPI Madison Maxwell is a 84 y.o. female with a hx of hyperlipidemia, duodenal ulcer, prior CVA, and PAF who presents to the ED with complaints of R lower dental pain for the past 2-3 days. Patient states she has a broken tooth in the R lower gum which is the tooth that is bothering her. She states pain is constant, an 8/10 in severity, worse with palpation, mildly alleviated by Tylenol taken at home.  Denies fever, chills, nausea, vomiting, sore throat, trouble swallowing/trouble breathing, or swelling beneath the tongue. Patient has a dentist she sees regularly.   HPI  Past Medical History:  Diagnosis Date  . Allergic rhinitis   . Anxiety   . Duodenal ulcer   . External hemorrhoid   . Hyperlipidemia   . Memory deficit   . Osteoarthritis   . Osteoporosis   . PAF (paroxysmal atrial fibrillation) (HCC)    a. diagnosed in 05/2017. Started on Xarelto for anticoagulation.   . SUI (stress urinary incontinence), female   . Vitamin D deficiency     Patient Active Problem List   Diagnosis Date Noted  . Intracranial bleeding (HCC) 10/05/2017  . Fall from slip, trip, or stumble, initial encounter 10/05/2017  . Contusion of face 10/05/2017  . Hypertensive heart disease without CHF 06/07/2017  . Current use of long term anticoagulation 06/07/2017  . Leukopenia 06/06/2017  . Thrombocytopenia (HCC) 06/06/2017  . Paroxysmal atrial fibrillation (HCC) 06/06/2017  . History of CVA (cerebrovascular accident) 06/06/2017  . Gait disorder 12/23/2016  . Dyslipidemia 06/17/2016  . Ataxia   . Memory deficit     Past Surgical History:  Procedure Laterality Date  . APPENDECTOMY    . VAGINAL HYSTERECTOMY       OB History   None      Home Medications    Prior to Admission medications     Medication Sig Start Date End Date Taking? Authorizing Provider  acetaminophen (TYLENOL) 325 MG tablet Take 2 tablets (650 mg total) every 6 (six) hours as needed by mouth for mild pain or headache (or Fever >/= 101). 10/06/17   Vassie Loll, MD  cholecalciferol (VITAMIN D) 1000 units tablet Take 1,000 Units daily with breakfast by mouth.     [provider]  denosumab (PROLIA) 60 MG/ML SOLN injection Inject 60 mg into the skin every 6 (six) months. Administer in upper arm, thigh, or abdomen    [provider]  metoprolol tartrate (LOPRESSOR) 25 MG tablet Take 25 mg by mouth as directed. 1/2 tablet by mouth daily    [provider]  Multiple Vitamins-Minerals (MULTIVITAMINS THER. W/MINERALS) TABS Take 1 tablet every evening by mouth.     [provider]  omega-3 fish oil (MAXEPA) 1000 MG CAPS capsule Take 1 capsule daily by mouth.     [provider]  Probiotic Product (PROBIOTIC DAILY) CAPS Take 1 capsule daily at 6 PM by mouth.    [provider]  rivastigmine (EXELON) 9.5 mg/24hr Place 9.5 mg onto the skin daily.  08/27/15   [provider]  vitamin B-12 (CYANOCOBALAMIN) 1000 MCG tablet Take 1,000 mcg by mouth daily.    [provider]  vitamin C (ASCORBIC ACID) 500 MG tablet Take 500 mg by mouth daily.    [provider]    Family History Family History  Problem Relation Age of Onset  . Colon polyps Sister   . Colon cancer Neg Hx     Social History Social History   Tobacco Use  . Smoking status: Never Smoker  . Smokeless tobacco: Never Used  Substance Use Topics  . Alcohol use: No  . Drug use: No    Allergies   Patient has no known allergies.   Review of Systems Review of Systems  Constitutional: Negative for chills and fever.  HENT: Positive for dental problem. Negative for sore throat and trouble swallowing.   Respiratory: Negative for shortness of breath.   Gastrointestinal: Negative for  nausea and vomiting.   Physical Exam Updated Vital Signs BP (!) 148/73 (BP Location: Right Arm)   Pulse 80   Temp 97.9 F (36.6 C) (Oral)   Resp 16   Ht 5\' 5"  (1.651 m)   Wt 45.4 kg   SpO2 99%   BMI 16.64 kg/m   Physical Exam  Constitutional: She appears well-developed and well-nourished.  Non-toxic appearance. No distress.  HENT:  Head: Normocephalic and atraumatic.  Right Ear: Tympanic membrane normal. Tympanic membrane is not perforated, not erythematous, not retracted and not bulging.  Left Ear: Tympanic membrane normal. Tympanic membrane is not perforated, not erythematous, not retracted and not bulging.  Nose: Nose normal.  Mouth/Throat: Uvula is midline and oropharynx is clear and moist. No uvula swelling. No oropharyngeal exudate, posterior oropharyngeal erythema or tonsillar abscesses.    Patient has somewhat poor dentition throughout. Posterior oropharynx is symmetric appearing. Patient tolerating own secretions without difficulty. No trismus. No drooling. No hot potato voice. No swelling beneath the tongue, submandibular compartment is soft.   Eyes: Conjunctivae are normal. Right eye exhibits no discharge. Left eye exhibits no discharge.  Neck: Normal range of motion. Neck supple. No neck rigidity. No edema and no erythema present.  Cardiovascular: Normal rate.  Pulmonary/Chest: Effort normal and breath sounds normal. No respiratory distress.  Lymphadenopathy:    She has no cervical adenopathy.  Neurological: She is alert.  Psychiatric: She has a normal mood and affect. Her behavior is normal. Thought content normal.  Nursing note and vitals reviewed.  ED Treatments / Results  Labs (all labs ordered are listed, but only abnormal results are displayed) Labs Reviewed - No data to display  EKG None  Radiology No results found.  Procedures Procedures (including critical care time)  Medications Ordered in ED Medications - No data to display   Initial  Impression / Assessment and Plan / ED Course  I have reviewed the triage vital signs and the nursing notes.  Pertinent labs & imaging results that were available during my care of the patient were reviewed by me and considered in my medical decision making (see chart for details).    Patient presents with dental pain. Patient is nontoxic appearing, vitals without significant abnormality. No gross abscess.  Exam unconcerning for Ludwig's angina or spread of infection.  Will treat with Penicillin VK, continued tylenol, and urged patient to follow-up with dentist, patient does have a dentist she sees regularly, additional dental resources were provided.  Discussed treatment plan and need for follow up as well as return precautions. Provided opportunity for questions, patient and her husband confirmed understanding and are agreeable to plan.  Final Clinical Impressions(s) / ED Diagnoses   Final diagnoses:  Pain, dental    ED Discharge Orders         Ordered  penicillin v potassium (VEETID) 500 MG tablet  4 times daily     08/17/18 0657           Cherly Anderson, PA-C 08/17/18 0704    Terrilee Files, MD 08/17/18 1807

## 2018-08-17 NOTE — ED Triage Notes (Signed)
Pt reports having dental pain on right lower tooth for the last several days.

## 2018-10-25 ENCOUNTER — Other Ambulatory Visit (HOSPITAL_COMMUNITY): Payer: Self-pay | Admitting: *Deleted

## 2018-10-26 ENCOUNTER — Ambulatory Visit (HOSPITAL_COMMUNITY)
Admission: RE | Admit: 2018-10-26 | Discharge: 2018-10-26 | Disposition: A | Discharge status: Home / Self Care | Payer: Medicare Other | Source: Ambulatory Visit | Attending: Internal Medicine | Admitting: Internal Medicine

## 2018-10-26 DIAGNOSIS — M810 Age-related osteoporosis without current pathological fracture: Secondary | ICD-10-CM | POA: Diagnosis not present

## 2018-10-26 MED ORDER — DENOSUMAB 60 MG/ML ~~LOC~~ SOSY
60.0000 mg | PREFILLED_SYRINGE | Freq: Once | SUBCUTANEOUS | Status: AC
  Administered 2018-10-26: 60 mg via SUBCUTANEOUS

## 2018-10-26 MED ORDER — DENOSUMAB 60 MG/ML ~~LOC~~ SOSY
PREFILLED_SYRINGE | SUBCUTANEOUS | Status: AC
  Administered 2018-10-26: 13:00:00 60 mg via SUBCUTANEOUS
  Filled 2018-10-26: qty 1

## 2018-12-20 ENCOUNTER — Ambulatory Visit: Payer: Medicare Other | Admitting: Podiatry

## 2018-12-20 ENCOUNTER — Encounter: Payer: Self-pay | Admitting: Podiatry

## 2018-12-20 DIAGNOSIS — M79674 Pain in right toe(s): Secondary | ICD-10-CM

## 2018-12-20 DIAGNOSIS — B351 Tinea unguium: Secondary | ICD-10-CM

## 2018-12-20 DIAGNOSIS — M79675 Pain in left toe(s): Secondary | ICD-10-CM | POA: Diagnosis not present

## 2018-12-20 NOTE — Progress Notes (Signed)
   Subjective:    Patient ID: Madison Maxwell, female    DOB: Nov 30, 1920, 85 y.o.   MRN: [id number]  HPI 85 year old female presents the office today for concerns of thick, discolored toenails.  She states that because irritation inside shoes.  Denies any redness or drainage or any swelling.  No recent treatment.  No other concerns.   Review of Systems  All other systems reviewed and are negative.  Past Medical History:  Diagnosis Date  . Allergic rhinitis   . Anxiety   . Duodenal ulcer   . External hemorrhoid   . Hyperlipidemia   . Memory deficit   . Osteoarthritis   . Osteoporosis   . PAF (paroxysmal atrial fibrillation) (HCC)    a. diagnosed in 05/2017. Started on Xarelto for anticoagulation.   . SUI (stress urinary incontinence), female   . Vitamin D deficiency     Past Surgical History:  Procedure Laterality Date  . APPENDECTOMY    . VAGINAL HYSTERECTOMY       Current Outpatient Medications:  .  galantamine (RAZADYNE ER) 16 MG 24 hr capsule, TK ONE C PO QD WITH FOOD, Disp: , Rfl:  .  cholecalciferol (VITAMIN D) 1000 units tablet, Take 1,000 Units daily with breakfast by mouth. , Disp: , Rfl:  .  denosumab (PROLIA) 60 MG/ML SOLN injection, Inject 60 mg into the skin Maxwell 6 (six) months. Administer in upper arm, thigh, or abdomen, Disp: , Rfl:  .  metoprolol tartrate (LOPRESSOR) 25 MG tablet, Take 25 mg by mouth as directed. 1/2 tablet by mouth daily, Disp: , Rfl:  .  Multiple Vitamins-Minerals (MULTIVITAMINS THER. W/MINERALS) TABS, Take 1 tablet Maxwell evening by mouth. , Disp: , Rfl:  .  neomycin-polymyxin-hydrocortisone (CORTISPORIN) 3.5-10000-1 OTIC suspension, ADMINISTER 4 DROPS INTO LEFT EAR QID FOR 10 DAYS, Disp: , Rfl:  .  omega-3 fish oil (MAXEPA) 1000 MG CAPS capsule, Take 1 capsule daily by mouth. , Disp: , Rfl:  .  Probiotic Product (PROBIOTIC DAILY) CAPS, Take 1 capsule daily at 6 PM by mouth., Disp: , Rfl:  .  vitamin B-12 (CYANOCOBALAMIN) 1000 MCG tablet,  Take 1,000 mcg by mouth daily., Disp: , Rfl:  .  vitamin C (ASCORBIC ACID) 500 MG tablet, Take 500 mg by mouth daily., Disp: , Rfl:   No Known Allergies       Objective:   Physical Exam General: AAO x3, NAD  Dermatological: Nails are hypertrophic, dystrophic, brittle, discolored, elongated 10. No surrounding redness or drainage. Tenderness nails 1-5 bilaterally. No open lesions or pre-ulcerative lesions are identified today.  Vascular: Dorsalis Pedis artery and Posterior Tibial artery pedal pulses are full bilateral with immedate capillary fill time.  There is no pain with calf compression, swelling, warmth, erythema.   Neruologic: Grossly intact via light touch bilateral.  Sensation appears to be intact with Phoebe Perch monofilament  Musculoskeletal: No gross boney pedal deformities bilateral. No pain, crepitus, or limitation noted with foot and ankle range of motion bilateral.      Assessment & Plan:  85 year old female with symptomatic onychomycosis -Treatment options discussed including all alternatives, risks, and complications -Etiology of symptoms were discussed -Nails debrided 10 without complications or bleeding. -Daily foot inspection -Follow-up in 3 months or sooner if any problems arise. In the meantime, encouraged to call the office with any questions, concerns, change in symptoms.   Ovid Curd, DPM

## 2019-03-04 ENCOUNTER — Emergency Department (HOSPITAL_COMMUNITY): Payer: Medicare Other

## 2019-03-04 ENCOUNTER — Emergency Department (HOSPITAL_COMMUNITY)
Admission: EM | Admit: 2019-03-04 | Discharge: 2019-03-05 | Disposition: A | Discharge status: Home / Self Care | Payer: Medicare Other | Attending: Emergency Medicine | Admitting: Emergency Medicine

## 2019-03-04 ENCOUNTER — Encounter (HOSPITAL_COMMUNITY): Payer: Self-pay | Admitting: Emergency Medicine

## 2019-03-04 ENCOUNTER — Other Ambulatory Visit: Payer: Self-pay

## 2019-03-04 DIAGNOSIS — T07XXXA Unspecified multiple injuries, initial encounter: Secondary | ICD-10-CM

## 2019-03-04 DIAGNOSIS — Z8673 Personal history of transient ischemic attack (TIA), and cerebral infarction without residual deficits: Secondary | ICD-10-CM | POA: Diagnosis not present

## 2019-03-04 DIAGNOSIS — S0990XA Unspecified injury of head, initial encounter: Secondary | ICD-10-CM | POA: Diagnosis present

## 2019-03-04 DIAGNOSIS — Z79899 Other long term (current) drug therapy: Secondary | ICD-10-CM | POA: Insufficient documentation

## 2019-03-04 DIAGNOSIS — Y998 Other external cause status: Secondary | ICD-10-CM | POA: Insufficient documentation

## 2019-03-04 DIAGNOSIS — E785 Hyperlipidemia, unspecified: Secondary | ICD-10-CM | POA: Insufficient documentation

## 2019-03-04 DIAGNOSIS — T148XXA Other injury of unspecified body region, initial encounter: Secondary | ICD-10-CM | POA: Diagnosis not present

## 2019-03-04 DIAGNOSIS — I10 Essential (primary) hypertension: Secondary | ICD-10-CM | POA: Diagnosis not present

## 2019-03-04 DIAGNOSIS — Y939 Activity, unspecified: Secondary | ICD-10-CM | POA: Insufficient documentation

## 2019-03-04 DIAGNOSIS — W010XXA Fall on same level from slipping, tripping and stumbling without subsequent striking against object, initial encounter: Secondary | ICD-10-CM | POA: Diagnosis not present

## 2019-03-04 DIAGNOSIS — Y92009 Unspecified place in unspecified non-institutional (private) residence as the place of occurrence of the external cause: Secondary | ICD-10-CM | POA: Insufficient documentation

## 2019-03-04 DIAGNOSIS — I480 Paroxysmal atrial fibrillation: Secondary | ICD-10-CM | POA: Insufficient documentation

## 2019-03-04 DIAGNOSIS — W19XXXA Unspecified fall, initial encounter: Secondary | ICD-10-CM

## 2019-03-04 NOTE — ED Notes (Signed)
Patient transported to CT 

## 2019-03-04 NOTE — ED Triage Notes (Signed)
Pt brought in by daughter, reports tripping in the garage and hitting the back of her head on the garage door. Denies LOC. Pt has no other complaints at this time.

## 2019-03-05 ENCOUNTER — Other Ambulatory Visit: Payer: Self-pay

## 2019-03-05 NOTE — Discharge Instructions (Addendum)
We saw in the ER after you had a fall at home. CT scan of your brain does not reveal any evidence of brain bleed.  Based on exam, it does not appear that you have a broken neck, back, limbs, hips. We anticipate that you might have some nagging discomfort tomorrow, that can be treated with over-the-counter pain medications.  You may also consider ice therapy at the location of pain.

## 2019-03-05 NOTE — ED Notes (Signed)
Discharge instructions discussed with pt. Pt. Verbalized understanding and no questions at this time. Pt. To waiting room for family pickup.

## 2019-03-05 NOTE — ED Provider Notes (Signed)
[facility] EMERGENCY DEPARTMENT Provider Note   CSN: [id number] Arrival date & time: 03/04/19  2133    History   Chief Complaint Chief Complaint  Patient presents with   Fall    HPI Madison Maxwell is a 85 y.o. female.     HPI  85 year old female with history of paroxysmal A. Fib, hyperlipidemia comes in with chief complaint of fall. Patient is not on any anticoagulant.  According to family, patient had a mechanical fall at home.  Patient states that she tripped over her other foot eating to the fall.  Family was concerned that the way patient fell she might have injured her hips, back, head and neck.  Patient has no complaints from her side.  She denies any significant head trauma.  Patient denies any new numbness, tingling, weakness, severe headaches, nausea, vomiting.  Past Medical History:  Diagnosis Date   Allergic rhinitis    Anxiety    Duodenal ulcer    External hemorrhoid    Hyperlipidemia    Memory deficit    Osteoarthritis    Osteoporosis    PAF (paroxysmal atrial fibrillation) (HCC)    a. diagnosed in 05/2017. Started on Xarelto for anticoagulation.    SUI (stress urinary incontinence), female    Vitamin D deficiency     Patient Active Problem List   Diagnosis Date Noted   Intracranial bleeding (HCC) 10/05/2017   Fall from slip, trip, or stumble, initial encounter 10/05/2017   Contusion of face 10/05/2017   Hypertensive heart disease without CHF 06/07/2017   Current use of long term anticoagulation 06/07/2017   Leukopenia 06/06/2017   Thrombocytopenia (HCC) 06/06/2017   Paroxysmal atrial fibrillation (HCC) 06/06/2017   History of CVA (cerebrovascular accident) 06/06/2017   Gait disorder 12/23/2016   Dyslipidemia 06/17/2016   Ataxia    Memory deficit     Past Surgical History:  Procedure Laterality Date   APPENDECTOMY     VAGINAL HYSTERECTOMY       OB History   No obstetric history on file.       Home Medications    Prior to Admission medications   Medication Sig Start Date End Date Taking? Authorizing Provider  cholecalciferol (VITAMIN D) 1000 units tablet Take 1,000 Units daily with breakfast by mouth.     [provider]  denosumab (PROLIA) 60 MG/ML SOLN injection Inject 60 mg into the skin every 6 (six) months. Administer in upper arm, thigh, or abdomen    [provider]  galantamine (RAZADYNE ER) 16 MG 24 hr capsule TK ONE C PO QD WITH FOOD 10/29/18   [provider]  metoprolol tartrate (LOPRESSOR) 25 MG tablet Take 25 mg by mouth as directed. 1/2 tablet by mouth daily    [provider]  Multiple Vitamins-Minerals (MULTIVITAMINS THER. W/MINERALS) TABS Take 1 tablet every evening by mouth.     [provider]  neomycin-polymyxin-hydrocortisone (CORTISPORIN) 3.5-10000-1 OTIC suspension ADMINISTER 4 DROPS INTO LEFT EAR QID FOR 10 DAYS 11/19/18   [provider]  omega-3 fish oil (MAXEPA) 1000 MG CAPS capsule Take 1 capsule daily by mouth.     [provider]  Probiotic Product (PROBIOTIC DAILY) CAPS Take 1 capsule daily at 6 PM by mouth.    [provider]  vitamin B-12 (CYANOCOBALAMIN) 1000 MCG tablet Take 1,000 mcg by mouth daily.    [provider]  vitamin C (ASCORBIC ACID) 500 MG tablet Take 500 mg by mouth daily.    [provider]    Family History Family History  Problem Relation Age of Onset   Colon polyps Sister    Colon cancer Neg Hx     Social History Social History   Tobacco Use   Smoking status: Never Smoker   Smokeless tobacco: Never Used  Substance Use Topics   Alcohol use: No   Drug use: No     Allergies   Patient has no known allergies.   Review of Systems Review of Systems  Constitutional: Negative for activity change.  Musculoskeletal: Negative for arthralgias and myalgias.  Skin: Negative for rash and wound.  Hematological: Does not  bruise/bleed easily.     Physical Exam Updated Vital Signs BP (!) 149/63    Pulse (!) 103    Resp 16    Ht 5\' 5"  (1.651 m)    Wt 45.4 kg    SpO2 90%    BMI 16.64 kg/m   Physical Exam Vitals signs and nursing note reviewed.  Constitutional:      Appearance: She is well-developed.  HENT:     Head: Normocephalic and atraumatic.  Eyes:     Extraocular Movements: Extraocular movements intact.     Pupils: Pupils are equal, round, and reactive to light.  Neck:     Musculoskeletal: Normal range of motion and neck supple.     Comments: No midline c-spine tenderness, pt able to turn head to 45 degrees bilaterally without any pain and able to flex neck to the chest and extend without any pain or neurologic symptoms.  Cardiovascular:     Rate and Rhythm: Normal rate.  Pulmonary:     Effort: Pulmonary effort is normal.  Abdominal:     General: Bowel sounds are normal.  Musculoskeletal:     Comments: Head to toe evaluation shows no hematoma, bleeding of the scalp, no facial abrasions, no spine step offs, crepitus of the chest or neck, no tenderness to palpation of the bilateral upper and lower extremities, no gross deformities, no chest tenderness, no pelvic pain.   Skin:    General: Skin is warm and dry.  Neurological:     Mental Status: She is alert and oriented to person, place, and time.      ED Treatments / Results  Labs (all labs ordered are listed, but only abnormal results are displayed) Labs Reviewed - No data to display  EKG None  Radiology Ct Head Wo Contrast  Result Date: 03/05/2019 CLINICAL DATA:  Fall with head trauma EXAM: CT HEAD WITHOUT CONTRAST TECHNIQUE: Contiguous axial images were obtained from the base of the skull through the vertex without intravenous contrast. COMPARISON:  None. FINDINGS: Brain: There is no mass, hemorrhage or extra-axial collection. The size and configuration of the ventricles and extra-axial CSF spaces are normal. There is hypoattenuation  of the white matter, most commonly indicating chronic small vessel disease. Old right centrum semiovale lacunar infarct. Vascular: No abnormal hyperdensity of the major intracranial arteries or dural venous sinuses. No intracranial atherosclerosis. Skull: The visualized skull base, calvarium and extracranial soft tissues are normal. Sinuses/Orbits: No fluid levels or advanced mucosal thickening of the visualized paranasal sinuses. No mastoid or middle ear effusion. The orbits are normal. IMPRESSION: 1. No acute intracranial abnormality. 2. Findings of chronic small vessel disease and old right centrum semiovale lacunar infarct. Electronically Signed   By: Deatra Robinson M.D.   On: 03/05/2019 00:08    Procedures Procedures (including critical care time)  Medications Ordered in ED Medications -  No data to display   Initial Impression / Assessment and Plan / ED Course  I have reviewed the triage vital signs and the nursing notes.  Pertinent labs & imaging results that were available during my care of the patient were reviewed by me and considered in my medical decision making (see chart for details).        85 year old female comes in a chief complaint of fall. She is not on any anticoagulant.  Fall appears to be mechanical in nature. Given her age, we will get a CT scan of her head to make sure there is no brain bleed.  C-spine was cleared clinically. Additionally musculoskeletal exam does not reveal any focal deformity, tenderness of the long bones or the spine.  I spoke with patient's daughter once a CT head was resulted.  They did not have any other concerns besides the trauma.  Patient will be stable for discharge with a CT head is negative.  Final Clinical Impressions(s) / ED Diagnoses   Final diagnoses:  Fall in home, initial encounter  Multiple contusions    ED Discharge Orders    None       Derwood Kaplan, MD 03/05/19 [id number]

## 2019-03-21 ENCOUNTER — Ambulatory Visit: Payer: Medicare Other | Admitting: Podiatry

## 2019-04-15 ENCOUNTER — Other Ambulatory Visit (HOSPITAL_COMMUNITY): Payer: Self-pay | Admitting: *Deleted

## 2019-04-18 ENCOUNTER — Ambulatory Visit (HOSPITAL_COMMUNITY)
Admission: RE | Admit: 2019-04-18 | Discharge: 2019-04-18 | Disposition: A | Discharge status: Home / Self Care | Payer: Medicare Other | Source: Ambulatory Visit | Attending: Internal Medicine | Admitting: Internal Medicine

## 2019-04-18 ENCOUNTER — Other Ambulatory Visit: Payer: Self-pay

## 2019-04-18 DIAGNOSIS — M810 Age-related osteoporosis without current pathological fracture: Secondary | ICD-10-CM | POA: Diagnosis present

## 2019-04-18 MED ORDER — DENOSUMAB 60 MG/ML ~~LOC~~ SOSY
PREFILLED_SYRINGE | SUBCUTANEOUS | Status: AC
  Administered 2019-04-18: 60 mg via SUBCUTANEOUS
  Filled 2019-04-18: qty 1

## 2019-04-18 MED ORDER — DENOSUMAB 60 MG/ML ~~LOC~~ SOSY
60.0000 mg | PREFILLED_SYRINGE | Freq: Once | SUBCUTANEOUS | Status: AC
  Administered 2019-04-18: 60 mg via SUBCUTANEOUS

## 2019-05-31 ENCOUNTER — Other Ambulatory Visit: Payer: Self-pay

## 2019-05-31 ENCOUNTER — Ambulatory Visit (INDEPENDENT_AMBULATORY_CARE_PROVIDER_SITE_OTHER): Payer: Medicare Other | Admitting: Podiatry

## 2019-05-31 ENCOUNTER — Encounter: Payer: Self-pay | Admitting: Podiatry

## 2019-05-31 DIAGNOSIS — M79675 Pain in left toe(s): Secondary | ICD-10-CM | POA: Diagnosis not present

## 2019-05-31 DIAGNOSIS — M79674 Pain in right toe(s): Secondary | ICD-10-CM

## 2019-05-31 DIAGNOSIS — L89891 Pressure ulcer of other site, stage 1: Secondary | ICD-10-CM | POA: Diagnosis not present

## 2019-05-31 DIAGNOSIS — B351 Tinea unguium: Secondary | ICD-10-CM

## 2019-05-31 NOTE — Patient Instructions (Signed)

## 2019-06-04 NOTE — Progress Notes (Signed)
Subjective:  Madison Maxwell presents to clinic today with cc of  painful, thick, discolored, elongated toenails 1-5 b/l that become tender and cannot cut because of thickness.  Pain is aggravated when wearing enclosed shoe gear.  Her son is present during the visit.  Crist Infante, MD is her PCP.    Current Outpatient Medications:  .  cholecalciferol (VITAMIN D) 1000 units tablet, Take 1,000 Units daily with breakfast by mouth. , Disp: , Rfl:  .  denosumab (PROLIA) 60 MG/ML SOLN injection, Inject 60 mg into the skin every 6 (six) months. Administer in upper arm, thigh, or abdomen, Disp: , Rfl:  .  galantamine (RAZADYNE ER) 16 MG 24 hr capsule, TK ONE C PO QD WITH FOOD, Disp: , Rfl:  .  metoprolol tartrate (LOPRESSOR) 25 MG tablet, Take 25 mg by mouth as directed. 1/2 tablet by mouth daily, Disp: , Rfl:  .  Multiple Vitamins-Minerals (MULTIVITAMINS THER. W/MINERALS) TABS, Take 1 tablet every evening by mouth. , Disp: , Rfl:  .  neomycin-polymyxin-hydrocortisone (CORTISPORIN) 3.5-10000-1 OTIC suspension, ADMINISTER 4 DROPS INTO LEFT EAR QID FOR 10 DAYS, Disp: , Rfl:  .  nitrofurantoin, macrocrystal-monohydrate, (MACROBID) 100 MG capsule, TK 1 C PO BID, Disp: , Rfl:  .  omega-3 fish oil (MAXEPA) 1000 MG CAPS capsule, Take 1 capsule daily by mouth. , Disp: , Rfl:  .  Probiotic Product (PROBIOTIC DAILY) CAPS, Take 1 capsule daily at 6 PM by mouth., Disp: , Rfl:  .  vitamin B-12 (CYANOCOBALAMIN) 1000 MCG tablet, Take 1,000 mcg by mouth daily., Disp: , Rfl:  .  vitamin C (ASCORBIC ACID) 500 MG tablet, Take 500 mg by mouth daily., Disp: , Rfl:    No Known Allergies   Objective:  Physical Examination:  Vascular Examination: Capillary refill time immediate x 10 digits.  Palpable DP/PT pulses b/l.  Digital hair present b/l.  No edema noted b/l.  Skin temperature gradient WNL b/l.  Dermatological Examination: Skin with normal turgor, texture and tone b/l.  No open wounds b/l.  No  interdigital macerations noted b/l.  Early pressure spot noted distal tip left hallux. No edema, no blistering.   Elongated, thick, discolored brittle toenails with subungual debris and pain on dorsal palpation of nailbeds 1-5 b/l.  Musculoskeletal Examination: Muscle strength 5/5 to all muscle groups b/l.  No pain, crepitus or joint discomfort with active/passive ROM.  Neurological Examination: Sensation intact 5/5 b/l with 10 gram monofilament.  Vibratory sensation intact b/l.  Proprioceptive sensation intact b/l.  Assessment: Mycotic nail infection with pain 1-5 b/l Early pressure spot left hallux, stable  Plan: 1. Toenails 1-5 b/l were debrided in length and girth without iatrogenic laceration. 2. Dispensed silicone toe cap for bilateral hallux. 3. Continue soft, supportive shoe gear daily. 4. Report any pedal injuries to medical professional. 5.  Follow up 3 months. 6.  Patient/POA to call should there be a question/concern in there interim.

## 2019-08-04 ENCOUNTER — Emergency Department (HOSPITAL_COMMUNITY): Payer: Medicare Other

## 2019-08-04 ENCOUNTER — Emergency Department (HOSPITAL_COMMUNITY)
Admission: EM | Admit: 2019-08-04 | Discharge: 2019-08-05 | Disposition: A | Discharge status: Home / Self Care | Payer: Medicare Other | Attending: Emergency Medicine | Admitting: Emergency Medicine

## 2019-08-04 ENCOUNTER — Other Ambulatory Visit: Payer: Self-pay

## 2019-08-04 ENCOUNTER — Encounter (HOSPITAL_COMMUNITY): Payer: Self-pay | Admitting: *Deleted

## 2019-08-04 DIAGNOSIS — Y9301 Activity, walking, marching and hiking: Secondary | ICD-10-CM | POA: Diagnosis not present

## 2019-08-04 DIAGNOSIS — W01198A Fall on same level from slipping, tripping and stumbling with subsequent striking against other object, initial encounter: Secondary | ICD-10-CM | POA: Insufficient documentation

## 2019-08-04 DIAGNOSIS — Z79899 Other long term (current) drug therapy: Secondary | ICD-10-CM | POA: Insufficient documentation

## 2019-08-04 DIAGNOSIS — Y999 Unspecified external cause status: Secondary | ICD-10-CM | POA: Diagnosis not present

## 2019-08-04 DIAGNOSIS — S0990XA Unspecified injury of head, initial encounter: Secondary | ICD-10-CM

## 2019-08-04 DIAGNOSIS — Y92009 Unspecified place in unspecified non-institutional (private) residence as the place of occurrence of the external cause: Secondary | ICD-10-CM | POA: Insufficient documentation

## 2019-08-04 DIAGNOSIS — Z23 Encounter for immunization: Secondary | ICD-10-CM | POA: Insufficient documentation

## 2019-08-04 DIAGNOSIS — I10 Essential (primary) hypertension: Secondary | ICD-10-CM | POA: Diagnosis not present

## 2019-08-04 DIAGNOSIS — S0101XA Laceration without foreign body of scalp, initial encounter: Secondary | ICD-10-CM | POA: Diagnosis not present

## 2019-08-04 DIAGNOSIS — W19XXXA Unspecified fall, initial encounter: Secondary | ICD-10-CM

## 2019-08-04 MED ORDER — LIDOCAINE-EPINEPHRINE (PF) 2 %-1:200000 IJ SOLN
10.0000 mL | Freq: Once | INTRAMUSCULAR | Status: AC
  Administered 2019-08-04: 10 mL
  Filled 2019-08-04: qty 20

## 2019-08-04 MED ORDER — TETANUS-DIPHTH-ACELL PERTUSSIS 5-2.5-18.5 LF-MCG/0.5 IM SUSP
0.5000 mL | Freq: Once | INTRAMUSCULAR | Status: AC
  Administered 2019-08-05: 0.5 mL via INTRAMUSCULAR
  Filled 2019-08-04: qty 0.5

## 2019-08-04 NOTE — ED Provider Notes (Signed)
[facility] EMERGENCY DEPARTMENT Provider Note   CSN: [id number] Arrival date & time: 08/04/19  2047     History   Chief Complaint Chief Complaint  Patient presents with   Fall   Head Laceration    HPI Madison Maxwell is a 85 y.o. female with a hx of anxiety, hyperlipidemia, paroxysmal a-fib (not currently anticoagulated) presents to the Emergency Department complaining of acute, persistent, laceration onset 7:30PM.  Pt reports she was walking the house without her walker when she tripped on a chair and fell, hitting her head on the door frame.  No LOC.  Pt denies headache, neck pain, vision changes, numbness, tingling, weakness.  Pt reports no pain at this time.  Unknown last tetanus.  No aggravating or alleviating factors.  Son, at bedside, provides additional history however was not present during her fall.      The history is provided by the patient and medical records. No language interpreter was used.    Past Medical History:  Diagnosis Date   Allergic rhinitis    Anxiety    Duodenal ulcer    External hemorrhoid    Hyperlipidemia    Memory deficit    Osteoarthritis    Osteoporosis    PAF (paroxysmal atrial fibrillation) (HCC)    a. diagnosed in 05/2017. Started on Xarelto for anticoagulation.    SUI (stress urinary incontinence), female    Vitamin D deficiency     Patient Active Problem List   Diagnosis Date Noted   Intracranial bleeding (HCC) 10/05/2017   Fall from slip, trip, or stumble, initial encounter 10/05/2017   Contusion of face 10/05/2017   Hypertensive heart disease without CHF 06/07/2017   Current use of long term anticoagulation 06/07/2017   Leukopenia 06/06/2017   Thrombocytopenia (HCC) 06/06/2017   Paroxysmal atrial fibrillation (HCC) 06/06/2017   History of CVA (cerebrovascular accident) 06/06/2017   Gait disorder 12/23/2016   Dyslipidemia 06/17/2016   Ataxia    Memory deficit     Past Surgical  History:  Procedure Laterality Date   APPENDECTOMY     VAGINAL HYSTERECTOMY       OB History   No obstetric history on file.      Home Medications    Prior to Admission medications   Medication Sig Start Date End Date Taking? Authorizing Provider  cholecalciferol (VITAMIN D) 1000 units tablet Take 1,000 Units daily with breakfast by mouth.     [provider]  denosumab (PROLIA) 60 MG/ML SOLN injection Inject 60 mg into the skin every 6 (six) months. Administer in upper arm, thigh, or abdomen    [provider]  galantamine (RAZADYNE ER) 16 MG 24 hr capsule TK ONE C PO QD WITH FOOD 10/29/18   [provider]  metoprolol tartrate (LOPRESSOR) 25 MG tablet Take 25 mg by mouth as directed. 1/2 tablet by mouth daily    [provider]  Multiple Vitamins-Minerals (MULTIVITAMINS THER. W/MINERALS) TABS Take 1 tablet every evening by mouth.     [provider]  neomycin-polymyxin-hydrocortisone (CORTISPORIN) 3.5-10000-1 OTIC suspension ADMINISTER 4 DROPS INTO LEFT EAR QID FOR 10 DAYS 11/19/18   [provider]  nitrofurantoin, macrocrystal-monohydrate, (MACROBID) 100 MG capsule TK 1 C PO BID 01/10/19   [provider]  omega-3 fish oil (MAXEPA) 1000 MG CAPS capsule Take 1 capsule daily by mouth.     [provider]  Probiotic Product (PROBIOTIC DAILY) CAPS Take 1 capsule daily at 6 PM by mouth.  [provider]  vitamin B-12 (CYANOCOBALAMIN) 1000 MCG tablet Take 1,000 mcg by mouth daily.    [provider]  vitamin C (ASCORBIC ACID) 500 MG tablet Take 500 mg by mouth daily.    [provider]    Family History Family History  Problem Relation Age of Onset   Colon polyps Sister    Colon cancer Neg Hx     Social History Social History   Tobacco Use   Smoking status: Never Smoker   Smokeless tobacco: Never Used  Substance Use Topics   Alcohol use: No   Drug use: No      Allergies   Patient has no known allergies.   Review of Systems Review of Systems  Constitutional: Negative for fever.  HENT: Negative for dental problem and facial swelling.   Eyes: Negative for visual disturbance.  Respiratory: Negative for shortness of breath.   Cardiovascular: Negative for chest pain.  Gastrointestinal: Negative for nausea and vomiting.  Musculoskeletal: Negative for myalgias and neck pain.  Skin: Positive for wound.  Allergic/Immunologic: Negative for immunocompromised state.  Neurological: Negative for syncope and headaches.  Hematological: Does not bruise/bleed easily.  Psychiatric/Behavioral: The patient is not nervous/anxious.      Physical Exam Updated Vital Signs BP (!) 167/69    Pulse 81    Temp 97.9 F (36.6 C) (Oral)    Resp 16    SpO2 99%   Physical Exam Vitals signs and nursing note reviewed.  Constitutional:      General: She is not in acute distress.    Appearance: She is not diaphoretic.  HENT:     Head: Normocephalic. Laceration present.     Comments: 3 cm laceration to the top of the scalp Eyes:     General: No scleral icterus.    Conjunctiva/sclera: Conjunctivae normal.  Neck:     Musculoskeletal: Normal range of motion.  Cardiovascular:     Rate and Rhythm: Normal rate and regular rhythm.     Pulses: Normal pulses.          Radial pulses are 2+ on the right side and 2+ on the left side.  Pulmonary:     Effort: No tachypnea, accessory muscle usage, prolonged expiration, respiratory distress or retractions.     Breath sounds: No stridor.     Comments: Equal chest rise. No increased work of breathing. Abdominal:     General: There is no distension.     Palpations: Abdomen is soft.     Tenderness: There is no abdominal tenderness. There is no guarding or rebound.  Musculoskeletal:     Comments: Moves all extremities equally and without difficulty.  Skin:    General: Skin is warm and dry.     Capillary Refill:  Capillary refill takes less than 2 seconds.  Neurological:     Mental Status: She is alert.     GCS: GCS eye subscore is 4. GCS verbal subscore is 5. GCS motor subscore is 6.     Cranial Nerves: No dysarthria or facial asymmetry.     Motor: No weakness.     Comments: Speech is clear and goal oriented. Normal gait. Strong grip strength.  Psychiatric:        Mood and Affect: Mood normal.      ED Treatments / Results   Radiology Ct Head Wo Contrast  Result Date: 08/04/2019 CLINICAL DATA:  85 year old post trip and fall. Head trauma, minor, GCS>=13, high clinical risk, initial exam; C-spine trauma,  high clinical risk (NEXUS/CCR) EXAM: CT HEAD WITHOUT CONTRAST CT CERVICAL SPINE WITHOUT CONTRAST TECHNIQUE: Multidetector CT imaging of the head and cervical spine was performed following the standard protocol without intravenous contrast. Multiplanar CT image reconstructions of the cervical spine were also generated. COMPARISON:  Head CT 03/04/2019. Head and cervical spine CT 10/05/2017 FINDINGS: CT HEAD FINDINGS Brain: No intracranial hemorrhage, mass effect, or midline shift. Age related atrophy. No hydrocephalus. The basilar cisterns are patent. Similar chronic small vessel ischemia to prior. No evidence of territorial infarct or acute ischemia. No extra-axial or intracranial fluid collection. Vascular: Atherosclerosis of skullbase vasculature without hyperdense vessel or abnormal calcification. Skull: No fracture or focal lesion. Sinuses/Orbits: Paranasal sinuses and mastoid air cells are clear. The visualized orbits are unremarkable. Bilateral cataract resection. Other: None. CT CERVICAL SPINE FINDINGS Alignment: No traumatic subluxation. Degenerative anterolisthesis of C4 on C5 appears unchanged from prior exam. Skull base and vertebrae: No acute fracture. Vertebral body heights are maintained. The dens and skull base are intact. Soft tissues and spinal canal: No prevertebral fluid or swelling. No  visible canal hematoma. Disc levels: Advanced degenerative disc disease C5-C6 through C7-T1. Prominent multilevel facet hypertrophy. Degenerative changes are stable from prior exam. Upper chest: Exophytic hypodense nodule rising from the right thyroid lobe, unchanged. Biapical pleuroparenchymal scarring. Other: None.  Carotid calcifications. IMPRESSION: 1. No acute intracranial abnormality. No skull fracture. 2. Stable degenerative change in the cervical spine without acute fracture or subluxation. Electronically Signed   By: Narda RutherfordMelanie  Sanford M.D.   On: 08/04/2019 23:36   Ct Cervical Spine Wo Contrast  Result Date: 08/04/2019 CLINICAL DATA:  85 year old post trip and fall. Head trauma, minor, GCS>=13, high clinical risk, initial exam; C-spine trauma, high clinical risk (NEXUS/CCR) EXAM: CT HEAD WITHOUT CONTRAST CT CERVICAL SPINE WITHOUT CONTRAST TECHNIQUE: Multidetector CT imaging of the head and cervical spine was performed following the standard protocol without intravenous contrast. Multiplanar CT image reconstructions of the cervical spine were also generated. COMPARISON:  Head CT 03/04/2019. Head and cervical spine CT 10/05/2017 FINDINGS: CT HEAD FINDINGS Brain: No intracranial hemorrhage, mass effect, or midline shift. Age related atrophy. No hydrocephalus. The basilar cisterns are patent. Similar chronic small vessel ischemia to prior. No evidence of territorial infarct or acute ischemia. No extra-axial or intracranial fluid collection. Vascular: Atherosclerosis of skullbase vasculature without hyperdense vessel or abnormal calcification. Skull: No fracture or focal lesion. Sinuses/Orbits: Paranasal sinuses and mastoid air cells are clear. The visualized orbits are unremarkable. Bilateral cataract resection. Other: None. CT CERVICAL SPINE FINDINGS Alignment: No traumatic subluxation. Degenerative anterolisthesis of C4 on C5 appears unchanged from prior exam. Skull base and vertebrae: No acute fracture.  Vertebral body heights are maintained. The dens and skull base are intact. Soft tissues and spinal canal: No prevertebral fluid or swelling. No visible canal hematoma. Disc levels: Advanced degenerative disc disease C5-C6 through C7-T1. Prominent multilevel facet hypertrophy. Degenerative changes are stable from prior exam. Upper chest: Exophytic hypodense nodule rising from the right thyroid lobe, unchanged. Biapical pleuroparenchymal scarring. Other: None.  Carotid calcifications. IMPRESSION: 1. No acute intracranial abnormality. No skull fracture. 2. Stable degenerative change in the cervical spine without acute fracture or subluxation. Electronically Signed   By: Narda RutherfordMelanie  Sanford M.D.   On: 08/04/2019 23:36    Procedures .Marland Kitchen.Laceration Repair  Date/Time: 08/05/2019 12:05 AM Performed by: Dierdre ForthMuthersbaugh, Harith Mccadden, PA-C Authorized by: Dierdre ForthMuthersbaugh, Amori Cooperman, PA-C   Consent:    Consent obtained:  Verbal   Consent given by:  Patient   Risks discussed:  Infection, need for additional repair, pain, poor cosmetic result and poor wound healing   Alternatives discussed:  No treatment and delayed treatment Universal protocol:    Procedure explained and questions answered to patient or proxy's satisfaction: yes     Relevant documents present and verified: yes     Test results available and properly labeled: yes     Imaging studies available: yes     Required blood products, implants, devices, and special equipment available: yes     Site/side marked: yes     Immediately prior to procedure, a time out was called: yes     Patient identity confirmed:  Verbally with patient Anesthesia (see MAR for exact dosages):    Anesthesia method:  Local infiltration   Local anesthetic:  Lidocaine 2% WITH epi Laceration details:    Location:  Scalp   Scalp location:  Crown   Length (cm):  3 Repair type:    Repair type:  Simple Pre-procedure details:    Preparation:  Patient was prepped and draped in usual sterile  fashion and imaging obtained to evaluate for foreign bodies Exploration:    Hemostasis achieved with:  Epinephrine and direct pressure   Wound exploration: entire depth of wound probed and visualized   Treatment:    Area cleansed with:  Saline   Amount of cleaning:  Standard   Irrigation solution:  Sterile water   Irrigation volume:  250ml   Irrigation method:  Syringe Skin repair:    Repair method:  Staples   Number of staples:  2 Approximation:    Approximation:  Close Post-procedure details:    Dressing:  Open (no dressing)   Patient tolerance of procedure:  Tolerated well, no immediate complications   (including critical care time)  Medications Ordered in ED Medications  lidocaine-EPINEPHrine (XYLOCAINE W/EPI) 2 %-1:200000 (PF) injection 10 mL (has no administration in time range)  Tdap (BOOSTRIX) injection 0.5 mL (has no administration in time range)     Initial Impression / Assessment and Plan / ED Course  I have reviewed the triage vital signs and the nursing notes.  Pertinent labs & imaging results that were available during my care of the patient were reviewed by me and considered in my medical decision making (see chart for details).        Patient presents after mechanical fall.  No loss of consciousness, vision changes.  Gross neurologic exam is normal.  Small laceration to the top of the scalp.  Repaired with staples.  Tetanus updated.  CT scan without evidence of skull fracture, intracranial hemorrhage or cervical fracture.  Patient ambulatory here in the emergency department without assistance and with steady gait.  She wishes for discharge home.  Discussed wound care and reasons to return immediately to the emergency department with both patient and son.  Patient states understanding and is in agreement with the plan.  The patient was discussed with and seen by Dr. Preston FleetingGlick who agrees with the treatment plan.   Final Clinical Impressions(s) / ED Diagnoses    Final diagnoses:  Injury of head, initial encounter  Laceration of scalp, initial encounter  Fall, initial encounter    ED Discharge Orders    None       Banesa Tristan, Boyd KerbsHannah, PA-C 08/05/19 0007    Dione BoozeGlick, David, MD 08/05/19 [phone]

## 2019-08-04 NOTE — ED Triage Notes (Signed)
To ED via POV for treatment after tripping and falling. Accompanied by her son who states he was at the store when the pt fell. Pt normally uses a walker but did not at the time of the fall. Pt alert and oriented to person and event. With noted 1/2 inch lac to top of scalp. No loc. Pt was with her husband at time of fall - pt states she did not have loc. No neck pain. No face pain. No extremity pain.

## 2019-08-05 NOTE — Discharge Instructions (Addendum)
1. Medications: Tylenol or ibuprofen for pain, usual home medications 2. Treatment: ice for swelling, keep wound clean with warm soap and water and keep bandage dry, do not submerge in water for 24 hours 3. Follow Up: Please return in 5 days to have your stitches/staples removed or sooner if you have concerns. Return to the emergency department for increased redness, drainage of pus from the wound.  Please also return for confusion, seizures, vision changes, severe headaches or other concerns.   WOUND CARE  Keep area clean and dry for 24 hours. Do not remove bandage, if applied.  After 24 hours, remove bandage and wash wound gently with mild soap and warm water. Reapply a new bandage after cleaning wound, if directed.   Continue daily cleansing with soap and water until stitches/staples are removed.  Do not apply any ointments or creams to the wound while stitches/staples are in place, as this may cause delayed healing. Return if you experience any of the following signs of infection: Swelling, redness, pus drainage, streaking, fever >101.0 F  Return if you experience excessive bleeding that does not stop after 15-20 minutes of constant, firm pressure.

## 2019-09-05 ENCOUNTER — Ambulatory Visit (INDEPENDENT_AMBULATORY_CARE_PROVIDER_SITE_OTHER): Payer: Medicare Other | Admitting: Podiatry

## 2019-09-05 ENCOUNTER — Other Ambulatory Visit: Payer: Self-pay

## 2019-09-05 ENCOUNTER — Encounter: Payer: Self-pay | Admitting: Podiatry

## 2019-09-05 DIAGNOSIS — M79675 Pain in left toe(s): Secondary | ICD-10-CM

## 2019-09-05 DIAGNOSIS — B351 Tinea unguium: Secondary | ICD-10-CM | POA: Diagnosis not present

## 2019-09-05 DIAGNOSIS — M79674 Pain in right toe(s): Secondary | ICD-10-CM

## 2019-09-05 DIAGNOSIS — L84 Corns and callosities: Secondary | ICD-10-CM

## 2019-09-05 NOTE — Patient Instructions (Signed)

## 2019-09-05 NOTE — Progress Notes (Signed)
Subjective: Madison Maxwell is seen today for follow up painful, elongated, thickened toenails 1-5 b/l feet that she cannot cut. Pain interferes with daily activities. Aggravating factor includes wearing enclosed shoe gear and relieved with periodic debridement.  Her daughter is present during the visit. Mrs. Sferrazza is noted to have memory loss.   Current Outpatient Medications on File Prior to Visit  Medication Sig  . cholecalciferol (VITAMIN D) 1000 units tablet Take 1,000 Units daily with breakfast by mouth.   . denosumab (PROLIA) 60 MG/ML SOLN injection Inject 60 mg into the skin every 6 (six) months. Administer in upper arm, thigh, or abdomen  . galantamine (RAZADYNE ER) 16 MG 24 hr capsule TK ONE C PO QD WITH FOOD  . metoprolol tartrate (LOPRESSOR) 25 MG tablet Take 25 mg by mouth as directed. 1/2 tablet by mouth daily  . Multiple Vitamins-Minerals (MULTIVITAMINS THER. W/MINERALS) TABS Take 1 tablet every evening by mouth.   . neomycin-polymyxin-hydrocortisone (CORTISPORIN) 3.5-10000-1 OTIC suspension ADMINISTER 4 DROPS INTO LEFT EAR QID FOR 10 DAYS  . nitrofurantoin, macrocrystal-monohydrate, (MACROBID) 100 MG capsule TK 1 C PO BID  . omega-3 fish oil (MAXEPA) 1000 MG CAPS capsule Take 1 capsule daily by mouth.   . Probiotic Product (PROBIOTIC DAILY) CAPS Take 1 capsule daily at 6 PM by mouth.  . vitamin B-12 (CYANOCOBALAMIN) 1000 MCG tablet Take 1,000 mcg by mouth daily.  . vitamin C (ASCORBIC ACID) 500 MG tablet Take 500 mg by mouth daily.   No current facility-administered medications on file prior to visit.     No Known Allergies   Objective:  Vascular Examination: Capillary refill time immediate x 10 digits.  Dorsalis pedis present b/l.  Posterior tibial pulses present b/l.  Digital hair  present x 10 digits.  Skin temperature gradient WNL b/l.   Dermatological Examination: Skin with normal turgor, texture and tone b/l.  Toenails 1-5 b/l discolored, thick,  dystrophic with subungual debris and pain with palpation to nailbeds due to thickness of nails.  Early pressure spot distal tip left hallux resolved.  Hyperkeratotic lesion submet head 3 left foot.  No edema, no erythema, no drainage, no flocculence.  Musculoskeletal: Muscle strength 5/5 to all LE muscle groups.  HAV with bunion deformity b/l.   No pain, crepitus or joint limitation noted with ROM.   Neurological Examination: Protective sensation intact with 10 gram monofilament bilaterally.  Vibratory sensation intact bilaterally.   Assessment: Painful onychomycosis toenails 1-5 b/l  Callus submet head 3 left foot  Plan: 1. Toenails 1-5 b/l were debrided in length and girth without iatrogenic bleeding. Calluses pared submetatarsal head(s) 3 left foot utilizing sterile scalpel blade without incident. 2. Patient to continue soft, supportive shoe gear. 3. Patient to report any pedal injuries to medical professional immediately. 4. Follow up 3 months.  5. Patient/POA to call should there be a concern in the interim.

## 2019-11-10 ENCOUNTER — Other Ambulatory Visit (HOSPITAL_COMMUNITY): Payer: Self-pay | Admitting: *Deleted

## 2019-11-11 ENCOUNTER — Ambulatory Visit (HOSPITAL_COMMUNITY)
Admission: RE | Admit: 2019-11-11 | Discharge: 2019-11-11 | Disposition: A | Discharge status: Home / Self Care | Payer: Medicare Other | Source: Ambulatory Visit | Attending: Internal Medicine | Admitting: Internal Medicine

## 2019-11-11 ENCOUNTER — Other Ambulatory Visit: Payer: Self-pay

## 2019-11-11 DIAGNOSIS — M810 Age-related osteoporosis without current pathological fracture: Secondary | ICD-10-CM | POA: Diagnosis not present

## 2019-11-11 MED ORDER — DENOSUMAB 60 MG/ML ~~LOC~~ SOSY: 60.0000 mg | PREFILLED_SYRINGE | Freq: Once | SUBCUTANEOUS | Status: AC

## 2019-11-11 MED ORDER — DENOSUMAB 60 MG/ML ~~LOC~~ SOSY
PREFILLED_SYRINGE | SUBCUTANEOUS | Status: AC
  Administered 2019-11-11: 60 mg via SUBCUTANEOUS
  Filled 2019-11-11: qty 1

## 2019-12-12 ENCOUNTER — Ambulatory Visit: Payer: Medicare Other | Admitting: Podiatry

## 2020-02-15 ENCOUNTER — Emergency Department (HOSPITAL_COMMUNITY): Payer: Medicare PPO

## 2020-02-15 ENCOUNTER — Other Ambulatory Visit: Payer: Self-pay

## 2020-02-15 ENCOUNTER — Emergency Department (HOSPITAL_COMMUNITY)
Admission: EM | Admit: 2020-02-15 | Discharge: 2020-02-15 | Disposition: A | Discharge status: Home / Self Care | Payer: Medicare PPO | Attending: Emergency Medicine | Admitting: Emergency Medicine

## 2020-02-15 DIAGNOSIS — I509 Heart failure, unspecified: Secondary | ICD-10-CM | POA: Insufficient documentation

## 2020-02-15 DIAGNOSIS — Z79899 Other long term (current) drug therapy: Secondary | ICD-10-CM | POA: Diagnosis not present

## 2020-02-15 DIAGNOSIS — R531 Weakness: Secondary | ICD-10-CM | POA: Diagnosis present

## 2020-02-15 DIAGNOSIS — I110 Hypertensive heart disease with heart failure: Secondary | ICD-10-CM | POA: Diagnosis not present

## 2020-02-15 DIAGNOSIS — N3000 Acute cystitis without hematuria: Secondary | ICD-10-CM

## 2020-02-15 LAB — BASIC METABOLIC PANEL
Anion gap: 10 (ref 5–15)
BUN: 20 mg/dL (ref 8–23)
CO2: 26 mmol/L (ref 22–32)
Calcium: 8.8 mg/dL — ABNORMAL LOW (ref 8.9–10.3)
Chloride: 105 mmol/L (ref 98–111)
Creatinine, Ser: 0.36 mg/dL — ABNORMAL LOW (ref 0.44–1.00)
GFR calc Af Amer: >60 mL/min (ref >60)
GFR calc non Af Amer: >60 mL/min (ref >60)
Glucose, Bld: 121 mg/dL — ABNORMAL HIGH (ref 70–99)
Potassium: 3.8 mmol/L (ref 3.5–5.1)
Sodium: 141 mmol/L (ref 135–145)

## 2020-02-15 LAB — CBC
HCT: 37.8 % (ref 36.0–46.0)
Hemoglobin: 12.2 g/dL (ref 12.0–15.0)
MCH: 31.8 pg (ref 26.0–34.0)
MCHC: 32.3 g/dL (ref 30.0–36.0)
MCV: 98.4 fL (ref 80.0–100.0)
Platelets: 142 10*3/uL — ABNORMAL LOW (ref 150–400)
RBC: 3.84 MIL/uL — ABNORMAL LOW (ref 3.87–5.11)
RDW: 12.3 % (ref 11.5–15.5)
WBC: 4.2 10*3/uL (ref 4.0–10.5)
nRBC: 0 % (ref 0.0–0.2)

## 2020-02-15 LAB — URINALYSIS, ROUTINE W REFLEX MICROSCOPIC
Bilirubin Urine: NEGATIVE
Glucose, UA: NEGATIVE mg/dL
Hgb urine dipstick: NEGATIVE
Ketones, ur: NEGATIVE mg/dL
Nitrite: NEGATIVE
Protein, ur: 30 mg/dL — AB
Specific Gravity, Urine: 1.024 (ref 1.005–1.030)
pH: 6 (ref 5.0–8.0)

## 2020-02-15 LAB — CBG MONITORING, ED: Glucose-Capillary: 110 mg/dL — ABNORMAL HIGH (ref 70–99)

## 2020-02-15 MED ORDER — SODIUM CHLORIDE 0.9 % IV BOLUS
500.0000 mL | Freq: Once | INTRAVENOUS | Status: AC
  Administered 2020-02-15: 500 mL via INTRAVENOUS

## 2020-02-15 MED ORDER — CEPHALEXIN 250 MG PO CAPS: 250.0000 mg | ORAL_CAPSULE | Freq: Three times a day (TID) | ORAL | 0 refills | Status: DC

## 2020-02-15 MED ORDER — SODIUM CHLORIDE 0.9% FLUSH: 3.0000 mL | Freq: Once | INTRAVENOUS | Status: DC

## 2020-02-15 MED ORDER — SODIUM CHLORIDE 0.9 % IV SOLN
1.0000 g | Freq: Once | INTRAVENOUS | Status: AC
  Administered 2020-02-15: 1 g via INTRAVENOUS
  Filled 2020-02-15: qty 10

## 2020-02-15 NOTE — Discharge Instructions (Addendum)
Take the antibiotics as prescribed, follow up with your primary care doctor to be rechecked

## 2020-02-15 NOTE — ED Triage Notes (Signed)
Pt here from [place] at [facility] for evaluation of bilateral leg weakness. Normally ambulatory but when she was walking to the dining hall she had acute onset of bilateral leg weakness, dragging her feet like she can't pick them up. Denies pain. Hx of dementia.

## 2020-02-15 NOTE — ED Provider Notes (Signed)
[facility] EMERGENCY DEPARTMENT Provider Note   CSN: [id number] Arrival date & time: 02/15/20  1852     History Weakness  Everlene C Glanz is a 86 y.o. female.  HPI   Pt complaining of weakness today. Per the report she was having trouble walking today.  SHe denies any complaints right now.   SHe denies fever or chills.  No cough.  No vomiting or diarrhea.  Patient's granddaughter who is here with her states that she was having difficulty walking where she was dragging her feet.  Unclear if it was both feet or just 1 foot.  Past Medical History:  Diagnosis Date  . Allergic rhinitis   . Anxiety   . Duodenal ulcer   . External hemorrhoid   . Hyperlipidemia   . Memory deficit   . Osteoarthritis   . Osteoporosis   . PAF (paroxysmal atrial fibrillation) ([place])    a. diagnosed in 05/2017. Started on Xarelto for anticoagulation.   . SUI (stress urinary incontinence), female   . Vitamin D deficiency     Patient Active Problem List   Diagnosis Date Noted  . Intracranial bleeding ([place]) 10/05/2017  . Fall from slip, trip, or stumble, initial encounter 10/05/2017  . Contusion of face 10/05/2017  . Hypertensive heart disease without CHF 06/07/2017  . Current use of long term anticoagulation 06/07/2017  . Leukopenia 06/06/2017  . Thrombocytopenia ([place]) 06/06/2017  . Paroxysmal atrial fibrillation ([place]) 06/06/2017  . History of CVA (cerebrovascular accident) 06/06/2017  . Gait disorder 12/23/2016  . Dyslipidemia 06/17/2016  . Ataxia   . Memory deficit     Past Surgical History:  Procedure Laterality Date  . APPENDECTOMY    . VAGINAL HYSTERECTOMY       OB History   No obstetric history on file.     Family History  Problem Relation Age of Onset  . Colon polyps Sister   . Colon cancer Neg Hx     Social History   Tobacco Use  . Smoking status: Never Smoker  . Smokeless tobacco: Never Used  Substance Use Topics  . Alcohol use: No  . Drug use: No     Home Medications Prior to Admission medications   Medication Sig Start Date End Date Taking? Authorizing Provider  cholecalciferol (VITAMIN D) 1000 units tablet Take 1,000 Units daily with breakfast by mouth.    Yes [provider]  CREON 36000 units CPEP capsule Take 8 capsules by mouth See admin instructions. Take 2 capsules with meals, and 1 capsule with snacks every day for a total of 8 capsules per day. 11/09/19  Yes [provider]  galantamine (RAZADYNE ER) 16 MG 24 hr capsule Take 16 mg by mouth daily with breakfast.  10/29/18  Yes [provider]  metoprolol tartrate (LOPRESSOR) 25 MG tablet Take 12.5 mg by mouth daily.    Yes [provider]  Multiple Vitamins-Minerals (MULTIVITAMINS THER. W/MINERALS) TABS Take 1 tablet every evening by mouth.    Yes [provider]  omega-3 fish oil (MAXEPA) 1000 MG CAPS capsule Take 1 capsule daily by mouth.    Yes [provider]  Probiotic Product (PROBIOTIC DAILY) CAPS Take 1 capsule daily at 6 PM by mouth.   Yes [provider]  vitamin B-12 (CYANOCOBALAMIN) 1000 MCG tablet Take 1,000 mcg by mouth daily.   Yes [provider]  vitamin C (ASCORBIC ACID) 500 MG tablet Take 500 mg by mouth daily.   Yes [provider]  cephALEXin (KEFLEX) 250 MG capsule Take 1 capsule (250 mg total) by mouth 3 (three) times daily. 02/15/20   Linwood Dibbles, MD  neomycin-polymyxin-hydrocortisone (CORTISPORIN) 3.5-10000-1 OTIC suspension Place 4 drops into the left ear 4 (four) times daily.  11/19/18   [provider]  nitrofurantoin, macrocrystal-monohydrate, (MACROBID) 100 MG capsule Take 100 mg by mouth 2 (two) times daily.  01/10/19   [provider]    Allergies    Patient has no known allergies.  Review of Systems   Review of Systems  All other systems reviewed and are negative.   Physical Exam Updated Vital Signs BP (!) 150/67   Pulse 75   Temp 97.8 F  (36.6 C) (Oral)   Resp 12   SpO2 99%   Physical Exam Vitals and nursing note reviewed.  Constitutional:      General: She is not in acute distress.    Appearance: She is well-developed.  HENT:     Head: Normocephalic and atraumatic.     Right Ear: External ear normal.     Left Ear: External ear normal.  Eyes:     General: No scleral icterus.       Right eye: No discharge.        Left eye: No discharge.     Conjunctiva/sclera: Conjunctivae normal.  Neck:     Trachea: No tracheal deviation.  Cardiovascular:     Rate and Rhythm: Normal rate and regular rhythm.  Pulmonary:     Effort: Pulmonary effort is normal. No respiratory distress.     Breath sounds: Normal breath sounds. No stridor. No wheezing or rales.  Abdominal:     General: Bowel sounds are normal. There is no distension.     Palpations: Abdomen is soft.     Tenderness: There is no abdominal tenderness. There is no guarding or rebound.  Musculoskeletal:        General: No tenderness.     Cervical back: Neck supple.  Skin:    General: Skin is warm and dry.     Findings: No rash.  Neurological:     Mental Status: She is alert and oriented to person, place, and time.     Cranial Nerves: No cranial nerve deficit (No facial droop, extraocular movements intact, tongue midline ).     Sensory: No sensory deficit.     Motor: No abnormal muscle tone or seizure activity.     Coordination: Coordination normal.     Comments: No pronator drift bilateral upper extrem, able to hold both legs off bed for 5 seconds, sensation intact in all extremities, no visual field cuts, no left or right sided neglect, normal finger-nose exam bilaterally, no nystagmus noted      ED Results / Procedures / Treatments   Labs (all labs ordered are listed, but only abnormal results are displayed) Labs Reviewed  BASIC METABOLIC PANEL - Abnormal; Notable for the following components:      Result Value   Glucose, Bld 121 (*)    Creatinine, Ser  0.36 (*)    Calcium 8.8 (*)    All other components within normal limits  CBC - Abnormal; Notable for the following components:   RBC 3.84 (*)    Platelets 142 (*)    All other components within normal limits  URINALYSIS, ROUTINE W REFLEX MICROSCOPIC - Abnormal; Notable for the following components:   APPearance HAZY (*)    Protein, ur 30 (*)    Leukocytes,Ua MODERATE (*)  Bacteria, UA MANY (*)    All other components within normal limits  CBG MONITORING, ED - Abnormal; Notable for the following components:   Glucose-Capillary 110 (*)    All other components within normal limits    EKG None  Radiology CT Head Wo Contrast  Result Date: 02/15/2020 CLINICAL DATA:  86 year old female with neurologic deficit. EXAM: CT HEAD WITHOUT CONTRAST TECHNIQUE: Contiguous axial images were obtained from the base of the skull through the vertex without intravenous contrast. COMPARISON:  Head CT dated 08/04/2019. FINDINGS: Brain: There is moderate age-related atrophy and chronic microvascular ischemic changes. There is no acute intracranial hemorrhage. No mass effect or midline shift. No extra-axial fluid collection. Vascular: No hyperdense vessel or unexpected calcification. Skull: Normal. Negative for fracture or focal lesion. Sinuses/Orbits: No acute finding. Other: Hearing aid device in the right external auditory canal. IMPRESSION: 1. No acute intracranial pathology. 2. Age-related atrophy and chronic microvascular ischemic changes. Electronically Signed   By: Elgie Collard M.D.   On: 02/15/2020 23:00    Procedures Procedures (including critical care time)  Medications Ordered in ED Medications  sodium chloride flush (NS) 0.9 % injection 3 mL (3 mLs Intravenous Not Given 02/15/20 2151)  cefTRIAXone (ROCEPHIN) 1 g in sodium chloride 0.9 % 100 mL IVPB (1 g Intravenous New Bag/Given 02/15/20 2237)  sodium chloride 0.9 % bolus 500 mL (500 mLs Intravenous New Bag/Given 02/15/20 2239)    ED  Course  I have reviewed the triage vital signs and the nursing notes.  Pertinent labs & imaging results that were available during my care of the patient were reviewed by me and considered in my medical decision making (see chart for details).    MDM Rules/Calculators/A&P                      Patient presented to the ED for evaluation of weakness.  In the ER she has a normal neurologic exam.  No abnormal findings noted.  Patient's laboratory tests are notable for many bacteria and 11-20 white blood cells.  This is suggestive of urinary tract infection.  Otherwise her laboratory tests are reassuring.  Head CT does not show any acute findings.  Patient otherwise appears well.  I think is reasonable to discharge her home with a course of antibiotics for urinary tract infection.  Warning signs precautions discussed.  Final Clinical Impression(s) / ED Diagnoses Final diagnoses:  Acute cystitis without hematuria    Rx / DC Orders ED Discharge Orders         Ordered    cephALEXin (KEFLEX) 250 MG capsule  3 times daily     02/15/20 2321           Linwood Dibbles, MD 02/15/20 2322

## 2020-02-15 NOTE — ED Notes (Signed)
Pt went to c-t 

## 2020-05-30 ENCOUNTER — Encounter: Payer: Self-pay | Admitting: Podiatry

## 2020-05-30 ENCOUNTER — Other Ambulatory Visit: Payer: Self-pay

## 2020-05-30 ENCOUNTER — Ambulatory Visit: Payer: Medicare PPO | Admitting: Podiatry

## 2020-05-30 DIAGNOSIS — L84 Corns and callosities: Secondary | ICD-10-CM

## 2020-05-30 DIAGNOSIS — M79675 Pain in left toe(s): Secondary | ICD-10-CM

## 2020-05-30 DIAGNOSIS — M79674 Pain in right toe(s): Secondary | ICD-10-CM | POA: Diagnosis not present

## 2020-05-30 DIAGNOSIS — B351 Tinea unguium: Secondary | ICD-10-CM

## 2020-05-30 DIAGNOSIS — L601 Onycholysis: Secondary | ICD-10-CM

## 2020-06-03 NOTE — Progress Notes (Addendum)
Subjective:  Patient ID: Madison Maxwell, female    DOB: 09-Sep-1920,  MRN: [id number]  86 y.o. female presents with painful callus(es) left foot and painful thick toenails that are difficult to trim. Pain interferes with ambulation. Aggravating factors include wearing enclosed shoe gear. Pain is relieved with periodic professional debridement.   Her son is present during today's visit.  Review of Systems: Negative except as noted in the HPI. Denies N/V/F/Ch. Past Medical History:  Diagnosis Date  . Allergic rhinitis   . Anxiety   . Duodenal ulcer   . External hemorrhoid   . Hyperlipidemia   . Memory deficit   . Osteoarthritis   . Osteoporosis   . PAF (paroxysmal atrial fibrillation) ([facility])    a. diagnosed in 05/2017. Started on Xarelto for anticoagulation.   . SUI (stress urinary incontinence), female   . Vitamin D deficiency    Past Surgical History:  Procedure Laterality Date  . APPENDECTOMY    . VAGINAL HYSTERECTOMY     Patient Active Problem List   Diagnosis Date Noted  . Intracranial bleeding ([facility]) 10/05/2017  . Fall from slip, trip, or stumble, initial encounter 10/05/2017  . Contusion of face 10/05/2017  . Hypertensive heart disease without CHF 06/07/2017  . Current use of long term anticoagulation 06/07/2017  . Leukopenia 06/06/2017  . Thrombocytopenia ([facility]) 06/06/2017  . Paroxysmal atrial fibrillation ([facility]) 06/06/2017  . History of CVA (cerebrovascular accident) 06/06/2017  . Gait disorder 12/23/2016  . Dyslipidemia 06/17/2016  . Ataxia   . Memory deficit     Current Outpatient Medications:  .  cephALEXin (KEFLEX) 250 MG capsule, Take 1 capsule (250 mg total) by mouth 3 (three) times daily., Disp: 21 capsule, Rfl: 0 .  cholecalciferol (VITAMIN D) 1000 units tablet, Take 1,000 Units daily with breakfast by mouth. , Disp: , Rfl:  .  CREON 36000 units CPEP capsule, Take 8 capsules by mouth See admin instructions. Take 2 capsules with meals, and 1 capsule with  snacks every day for a total of 8 capsules per day., Disp: , Rfl:  .  galantamine (RAZADYNE ER) 16 MG 24 hr capsule, Take 16 mg by mouth daily with breakfast. , Disp: , Rfl:  .  metoprolol tartrate (LOPRESSOR) 25 MG tablet, Take 12.5 mg by mouth daily. , Disp: , Rfl:  .  Multiple Vitamins-Minerals (MULTIVITAMINS THER. W/MINERALS) TABS, Take 1 tablet every evening by mouth. , Disp: , Rfl:  .  neomycin-polymyxin-hydrocortisone (CORTISPORIN) 3.5-10000-1 OTIC suspension, Place 4 drops into the left ear 4 (four) times daily. , Disp: , Rfl:  .  nitrofurantoin, macrocrystal-monohydrate, (MACROBID) 100 MG capsule, Take 100 mg by mouth 2 (two) times daily. , Disp: , Rfl:  .  omega-3 fish oil (MAXEPA) 1000 MG CAPS capsule, Take 1 capsule daily by mouth. , Disp: , Rfl:  .  Probiotic Product (PROBIOTIC DAILY) CAPS, Take 1 capsule daily at 6 PM by mouth., Disp: , Rfl:  .  vitamin B-12 (CYANOCOBALAMIN) 1000 MCG tablet, Take 1,000 mcg by mouth daily., Disp: , Rfl:  .  vitamin C (ASCORBIC ACID) 500 MG tablet, Take 500 mg by mouth daily., Disp: , Rfl:  No Known Allergies Social History   Tobacco Use  Smoking Status Never Smoker  Smokeless Tobacco Never Used    Objective:   Constitutional Pt is a pleasant 86 y.o. Caucasian female, WD, WN in NAD.[place] Kitchen  Vascular Neurovascular status unchanged b/l lower extremities. Capillary refill time to digits immediate b/l. Palpable pedal pulses b/l LE. Pedal  hair present. Lower extremity skin temperature gradient within normal limits. No cyanosis or clubbing noted.  Neurologic Normal speech. Oriented to person, place, and time. Protective sensation intact 5/5 intact bilaterally with 10g monofilament b/l. Vibratory sensation intact b/l. Proprioception intact bilaterally.  Dermatologic Pedal skin with normal turgor, texture and tone bilaterally. No open wounds bilaterally. No interdigital macerations bilaterally. Toenails 1-5 b/l elongated, discolored, dystrophic, thickened,  crumbly with subungual debris and tenderness to dorsal palpation. Hyperkeratotic lesion(s) submet head 3 left foot.  No erythema, no edema, no drainage, no flocculence. There is noted onchyolysis of entire nailplate of the right 2nd digit.  The nailbed remains intact. Thee is no erythema, no edema, no drainage, no underlying flocculence.  Orthopedic: Normal muscle strength 5/5 to all lower extremity muscle groups bilaterally. No pain crepitus or joint limitation noted with ROM b/l. Hallux valgus with bunion deformity noted b/l lower extremities.   Radiographs: None Assessment:  No diagnosis found. Plan:  Patient was evaluated and treated and all questions answered. Onychomycosis with pain -Nails palliatively debridement as below. -Educated on self-care  Procedure: Nail Debridement Rationale: Pain Type of Debridement: manual, sharp debridement. Instrumentation: Nail nipper, rotary burr. Number of Nails: 10  -Examined patient. -Toenails 1-5 b/l were debrided in length and girth with sterile nail nippers and dremel without iatrogenic bleeding.  -Right 2nd toe nailplate gently debrided from it's remaining attachment to digit. Nailbed cleansed with alcohol. Triple antibiotic ointment applied. No further treatment required. -Callus(es) submet head 3 left foot pared utilizing sterile scalpel blade without complication or incident. Total number debrided =1. -Patient to report any pedal injuries to medical professional immediately. -Patient to continue soft, supportive shoe gear daily. -Patient/POA to call should there be question/concern in the interim.  Return in about 3 months (around 08/30/2020) for nail and callus trim.  Madison Maxwell, DPM

## 2020-07-10 ENCOUNTER — Emergency Department (HOSPITAL_COMMUNITY)
Admission: EM | Admit: 2020-07-10 | Discharge: 2020-07-11 | Disposition: A | Discharge status: Home / Self Care | Payer: Medicare PPO | Attending: Emergency Medicine | Admitting: Emergency Medicine

## 2020-07-10 ENCOUNTER — Emergency Department (HOSPITAL_COMMUNITY): Payer: Medicare PPO

## 2020-07-10 DIAGNOSIS — I499 Cardiac arrhythmia, unspecified: Secondary | ICD-10-CM | POA: Diagnosis not present

## 2020-07-10 DIAGNOSIS — R29810 Facial weakness: Secondary | ICD-10-CM | POA: Diagnosis not present

## 2020-07-10 DIAGNOSIS — H748X3 Other specified disorders of middle ear and mastoid, bilateral: Secondary | ICD-10-CM | POA: Diagnosis not present

## 2020-07-10 DIAGNOSIS — N3000 Acute cystitis without hematuria: Secondary | ICD-10-CM | POA: Insufficient documentation

## 2020-07-10 DIAGNOSIS — G9389 Other specified disorders of brain: Secondary | ICD-10-CM | POA: Diagnosis not present

## 2020-07-10 DIAGNOSIS — R000 Tachycardia, unspecified: Secondary | ICD-10-CM | POA: Diagnosis not present

## 2020-07-10 DIAGNOSIS — Z85068 Personal history of other malignant neoplasm of small intestine: Secondary | ICD-10-CM | POA: Insufficient documentation

## 2020-07-10 DIAGNOSIS — I6389 Other cerebral infarction: Secondary | ICD-10-CM | POA: Diagnosis not present

## 2020-07-10 DIAGNOSIS — R270 Ataxia, unspecified: Secondary | ICD-10-CM | POA: Diagnosis not present

## 2020-07-10 DIAGNOSIS — R9082 White matter disease, unspecified: Secondary | ICD-10-CM | POA: Diagnosis not present

## 2020-07-10 DIAGNOSIS — F0390 Unspecified dementia without behavioral disturbance: Secondary | ICD-10-CM | POA: Diagnosis not present

## 2020-07-10 DIAGNOSIS — I10 Essential (primary) hypertension: Secondary | ICD-10-CM | POA: Diagnosis not present

## 2020-07-10 DIAGNOSIS — I7090 Unspecified atherosclerosis: Secondary | ICD-10-CM | POA: Diagnosis not present

## 2020-07-10 DIAGNOSIS — R410 Disorientation, unspecified: Secondary | ICD-10-CM | POA: Diagnosis present

## 2020-07-10 DIAGNOSIS — G319 Degenerative disease of nervous system, unspecified: Secondary | ICD-10-CM | POA: Diagnosis not present

## 2020-07-10 DIAGNOSIS — Z7901 Long term (current) use of anticoagulants: Secondary | ICD-10-CM | POA: Diagnosis not present

## 2020-07-10 DIAGNOSIS — R0902 Hypoxemia: Secondary | ICD-10-CM | POA: Diagnosis not present

## 2020-07-10 DIAGNOSIS — Z79899 Other long term (current) drug therapy: Secondary | ICD-10-CM | POA: Insufficient documentation

## 2020-07-10 DIAGNOSIS — G9340 Encephalopathy, unspecified: Secondary | ICD-10-CM | POA: Diagnosis not present

## 2020-07-10 DIAGNOSIS — I491 Atrial premature depolarization: Secondary | ICD-10-CM | POA: Diagnosis not present

## 2020-07-10 DIAGNOSIS — I639 Cerebral infarction, unspecified: Secondary | ICD-10-CM | POA: Diagnosis not present

## 2020-07-10 LAB — I-STAT CHEM 8, ED
BUN: 18 mg/dL (ref 8–23)
Calcium, Ion: 1.1 mmol/L — ABNORMAL LOW (ref 1.15–1.40)
Chloride: 103 mmol/L (ref 98–111)
Creatinine, Ser: 0.5 mg/dL (ref 0.44–1.00)
Glucose, Bld: 144 mg/dL — ABNORMAL HIGH (ref 70–99)
HCT: 36 % (ref 36.0–46.0)
Hemoglobin: 12.2 g/dL (ref 12.0–15.0)
Potassium: 3.5 mmol/L (ref 3.5–5.1)
Sodium: 140 mmol/L (ref 135–145)
TCO2: 27 mmol/L (ref 22–32)

## 2020-07-10 LAB — CBG MONITORING, ED: Glucose-Capillary: 131 mg/dL — ABNORMAL HIGH (ref 70–99)

## 2020-07-10 MED ORDER — SODIUM CHLORIDE 0.9% FLUSH
3.0000 mL | Freq: Once | INTRAVENOUS | Status: AC
  Administered 2020-07-11: 3 mL via INTRAVENOUS

## 2020-07-11 ENCOUNTER — Emergency Department (HOSPITAL_COMMUNITY): Payer: Medicare PPO

## 2020-07-11 DIAGNOSIS — I639 Cerebral infarction, unspecified: Secondary | ICD-10-CM

## 2020-07-11 DIAGNOSIS — H748X3 Other specified disorders of middle ear and mastoid, bilateral: Secondary | ICD-10-CM | POA: Diagnosis not present

## 2020-07-11 DIAGNOSIS — I4891 Unspecified atrial fibrillation: Secondary | ICD-10-CM | POA: Diagnosis not present

## 2020-07-11 DIAGNOSIS — G9389 Other specified disorders of brain: Secondary | ICD-10-CM | POA: Diagnosis not present

## 2020-07-11 DIAGNOSIS — R279 Unspecified lack of coordination: Secondary | ICD-10-CM | POA: Diagnosis not present

## 2020-07-11 DIAGNOSIS — I6389 Other cerebral infarction: Secondary | ICD-10-CM | POA: Diagnosis not present

## 2020-07-11 DIAGNOSIS — Z743 Need for continuous supervision: Secondary | ICD-10-CM | POA: Diagnosis not present

## 2020-07-11 DIAGNOSIS — R270 Ataxia, unspecified: Secondary | ICD-10-CM | POA: Diagnosis not present

## 2020-07-11 DIAGNOSIS — G319 Degenerative disease of nervous system, unspecified: Secondary | ICD-10-CM | POA: Diagnosis not present

## 2020-07-11 LAB — CBC
HCT: 37.5 % (ref 36.0–46.0)
Hemoglobin: 12.6 g/dL (ref 12.0–15.0)
MCH: 31.3 pg (ref 26.0–34.0)
MCHC: 33.6 g/dL (ref 30.0–36.0)
MCV: 93.3 fL (ref 80.0–100.0)
Platelets: 67 10*3/uL — ABNORMAL LOW (ref 150–400)
RBC: 4.02 MIL/uL (ref 3.87–5.11)
RDW: 12.4 % (ref 11.5–15.5)
WBC: 7.9 10*3/uL (ref 4.0–10.5)
nRBC: 0 % (ref 0.0–0.2)

## 2020-07-11 LAB — URINALYSIS, ROUTINE W REFLEX MICROSCOPIC
Bilirubin Urine: NEGATIVE
Glucose, UA: NEGATIVE mg/dL
Ketones, ur: NEGATIVE mg/dL
Nitrite: POSITIVE — AB
Protein, ur: NEGATIVE mg/dL
Specific Gravity, Urine: 1.013 (ref 1.005–1.030)
pH: 8 (ref 5.0–8.0)

## 2020-07-11 LAB — COMPREHENSIVE METABOLIC PANEL
ALT: 39 U/L (ref 0–44)
AST: 36 U/L (ref 15–41)
Albumin: 3.3 g/dL — ABNORMAL LOW (ref 3.5–5.0)
Alkaline Phosphatase: 70 U/L (ref 38–126)
Anion gap: 11 (ref 5–15)
BUN: 15 mg/dL (ref 8–23)
CO2: 25 mmol/L (ref 22–32)
Calcium: 9.1 mg/dL (ref 8.9–10.3)
Chloride: 102 mmol/L (ref 98–111)
Creatinine, Ser: 0.57 mg/dL (ref 0.44–1.00)
GFR calc Af Amer: >60 mL/min (ref >60)
GFR calc non Af Amer: >60 mL/min (ref >60)
Glucose, Bld: 146 mg/dL — ABNORMAL HIGH (ref 70–99)
Potassium: 3.6 mmol/L (ref 3.5–5.1)
Sodium: 138 mmol/L (ref 135–145)
Total Bilirubin: 0.7 mg/dL (ref 0.3–1.2)
Total Protein: 5.9 g/dL — ABNORMAL LOW (ref 6.5–8.1)

## 2020-07-11 LAB — PROTIME-INR
INR: 1.1 (ref 0.8–1.2)
Prothrombin Time: 13.4 seconds (ref 11.4–15.2)

## 2020-07-11 LAB — APTT: aPTT: 28 seconds (ref 24–36)

## 2020-07-11 LAB — DIFFERENTIAL
Abs Immature Granulocytes: 0.06 10*3/uL (ref 0.00–0.07)
Basophils Absolute: 0 10*3/uL (ref 0.0–0.1)
Basophils Relative: 0 %
Eosinophils Absolute: 0 10*3/uL (ref 0.0–0.5)
Eosinophils Relative: 1 %
Immature Granulocytes: 1 %
Lymphocytes Relative: 9 %
Lymphs Abs: 0.7 10*3/uL (ref 0.7–4.0)
Monocytes Absolute: 0.7 10*3/uL (ref 0.1–1.0)
Monocytes Relative: 9 %
Neutro Abs: 6.3 10*3/uL (ref 1.7–7.7)
Neutrophils Relative %: 80 %

## 2020-07-11 MED ORDER — ASPIRIN 81 MG PO CHEW
81.0000 mg | CHEWABLE_TABLET | Freq: Once | ORAL | Status: AC
  Administered 2020-07-11: 81 mg via ORAL
  Filled 2020-07-11: qty 1

## 2020-07-11 MED ORDER — LORAZEPAM 1 MG PO TABS
0.5000 mg | ORAL_TABLET | Freq: Once | ORAL | Status: AC
  Administered 2020-07-11: 0.5 mg via ORAL
  Filled 2020-07-11: qty 1

## 2020-07-11 MED ORDER — SODIUM CHLORIDE 0.9 % IV SOLN
1.0000 g | Freq: Once | INTRAVENOUS | Status: AC
  Administered 2020-07-11: 1 g via INTRAVENOUS
  Filled 2020-07-11: qty 10

## 2020-07-11 MED ORDER — CEPHALEXIN 500 MG PO CAPS
500.0000 mg | ORAL_CAPSULE | Freq: Three times a day (TID) | ORAL | 0 refills | Status: DC
Start: 2020-07-11 — End: 2020-11-02

## 2020-07-11 NOTE — ED Notes (Signed)
Samatha from [facility] has arrived for pt. Pt in NAD , as seen leaving

## 2020-07-11 NOTE — Consult Note (Signed)
NEURO HOSPITALIST CONSULT NOTE   Requestig physician: Dr. Wilkie Aye  Reason for Consult: Acute onset of gait abnormality and left sided incoordination  History obtained from:  EMS and Chart     HPI:                                                                                                                                          Madison Maxwell is an 86 y.o. female with a PMHx of dementia, HLD, PAF (not currently on anticoagulation per Pharmacy review of her meds list), osteoporosis and osteoarthritis, who presents acutely via EMS from her assisted living facility for sudden onset of abnormal gait and left sided incoordination. LKN was 2045. She was noted by staff to suddenly start leaning against a wall while standing and then was noted to have an unsteady gait. Left sided incoordination was also noted. EMS felt that her LUE and RLE were weak on their initial assessment. CBG was 164.   On arrival, the patient was alert but disoriented and able to move all 4 extremities.   Past Medical History:  Diagnosis Date   Allergic rhinitis    Anxiety    Duodenal ulcer    External hemorrhoid    Hyperlipidemia    Memory deficit    Osteoarthritis    Osteoporosis    PAF (paroxysmal atrial fibrillation) ([facility])    a. diagnosed in 05/2017. Started on Xarelto for anticoagulation.    SUI (stress urinary incontinence), female    Vitamin D deficiency     Past Surgical History:  Procedure Laterality Date   APPENDECTOMY     VAGINAL HYSTERECTOMY      Family History  Problem Relation Age of Onset   Colon polyps Sister    Colon cancer Neg Hx               Social History:  reports that she has never smoked. She has never used smokeless tobacco. She reports that she does not drink alcohol and does not use drugs.  No Known Allergies  HOME MEDICATIONS:                                                                                                                       No current facility-administered medications  on file prior to encounter.   Current Outpatient Medications on File Prior to Encounter  Medication Sig Dispense Refill   cephALEXin (KEFLEX) 250 MG capsule Take 1 capsule (250 mg total) by mouth 3 (three) times daily. 21 capsule 0   cholecalciferol (VITAMIN D) 1000 units tablet Take 1,000 Units daily with breakfast by mouth.      CREON 36000 units CPEP capsule Take 8 capsules by mouth See admin instructions. Take 2 capsules with meals, and 1 capsule with snacks every day for a total of 8 capsules per day.     galantamine (RAZADYNE ER) 16 MG 24 hr capsule Take 16 mg by mouth daily with breakfast.      metoprolol tartrate (LOPRESSOR) 25 MG tablet Take 12.5 mg by mouth daily.      Multiple Vitamins-Minerals (MULTIVITAMINS THER. W/MINERALS) TABS Take 1 tablet every evening by mouth.      neomycin-polymyxin-hydrocortisone (CORTISPORIN) 3.5-10000-1 OTIC suspension Place 4 drops into the left ear 4 (four) times daily.      nitrofurantoin, macrocrystal-monohydrate, (MACROBID) 100 MG capsule Take 100 mg by mouth 2 (two) times daily.      omega-3 fish oil (MAXEPA) 1000 MG CAPS capsule Take 1 capsule daily by mouth.      Probiotic Product (PROBIOTIC DAILY) CAPS Take 1 capsule daily at 6 PM by mouth.     vitamin B-12 (CYANOCOBALAMIN) 1000 MCG tablet Take 1,000 mcg by mouth daily.     vitamin C (ASCORBIC ACID) 500 MG tablet Take 500 mg by mouth daily.      ROS:                                                                                                                                       The patient has no current subjective complaints. Comprehensive ROS cannot be obtained due to her cognitive deficits.   Blood pressure (!) 159/94, pulse 92, temperature 99 F (37.2 C), temperature source Oral, resp. rate 16, weight 43 kg, SpO2 98 %.   General Examination:                                                                                                        Physical Exam  HEENT-  [place]/AT    Lungs- Respirations unlabored Extremities- Edema to distal BLE noted.   Neurological Examination Mental Status: Waxing and waning level of consciousness from awake and alert to eyes closed with decreased verbal responsiveness and attention to external environmental cues. Speech  fluent with intact comprehension for some basic motor commands, but had difficulty with several commands. Mild dysarthria. Not oriented to day, year or month, but was oriented to city and state. Perseverated with several motor commands and questions. Does not seem to be fully aware of the current situation/environment.    Cranial Nerves: II: Blinks to threat in bilateral visual fields. Pupils equal.   III,IV, VI: Will track to left and right with saccadic pursuits. No nystagmus.  V,VII: No facial droop. Facial temp sensation equal bilaterally VIII: hearing intact to voice IX,X: No hypophonia XI: Head is midline XII: Midline tongue extension Motor: Right : Upper extremity   5/5    Left:     Upper extremity   5/5  Lower extremity   5/5     Lower extremity   5/5 No pronator drift.  Sensory: Temp and light touch sensation subjectively intact in BUE and BLE Deep Tendon Reflexes: 2+ and symmetric throughout Plantars: Right: downgoing   Left: downgoing Cerebellar: Normal FNF on the right. Ataxic FNF on the left.  Gait: Deferred due to falls risk concerns   Lab Results: Basic Metabolic Panel: Recent Labs  Lab 07/10/20 2353  NA 140  K 3.5  CL 103  GLUCOSE 144*  BUN 18  CREATININE 0.50    CBC: Recent Labs  Lab 07/10/20 2353  HGB 12.2  HCT 36.0    Cardiac Enzymes: No results for input(s): CKTOTAL, CKMB, CKMBINDEX, TROPONINI in the last 168 hours.  Lipid Panel: No results for input(s): CHOL, TRIG, HDL, CHOLHDL, VLDL, LDLCALC in the last 168 hours.  Imaging: No results found.  Assessment: 86 year old female with atrial fibrillation (not on  anticoagulation), presenting with acute onset of unsteady gait and left sided dystaxia 1. Only focal deficit on exam is her LUE dystaxia. A small cerebellar stroke as well as toxic/metabolic/infectious precipitant for her acute gait unsteadiness are the primary components of the DDx. In the context of her advanced age and dementia, the potential benefits of tPA are felt to be significantly outweighed by the risks.  2. Exam is not consistent with LVO 3. Stroke risk factors: HLD and PAF  Recommendations: 1. MRI brain  2. Continue galantamine 3. Start ASA 81 mg po qd, first dose now. Not likely to be a good candidate for anticoagulation due to falls risk 4. Obtain a rectal temperature.  5. Assess for possible toxic/metabolic/infectious etiologies for her presentation. and plan per attending neurologist 6. Gentle IVF 7. Modified permissive HTN protocol given advanced age. Treat for SBP > 180.  Electronically signed: Dr. Caryl Pina 07/11/2020, 12:03 AM

## 2020-07-11 NOTE — ED Notes (Signed)
RN confirmed with Charge Elliot Gurney report could be given to [place], CNA at [facility] [employer] [employer] which is the only person available to receive report , CNA reports she is the only one there states no nurse works 3rd shift and that the next nurse will not come in until 9am.    Charge RN Elliot Gurney states , they normally give report to whoever is available and it has never been a problem.    RN again confirmed if this was standard practice and Charge RN states the same and gives the verbal approval to give report.  RN gave detail report to Rinaldo Cloud , CNA, all questions answered.

## 2020-07-11 NOTE — ED Triage Notes (Signed)
PT BIB EMS from [facility] , LKW 2045, 07/11/19. Altered mental but  HX, dementia , abnormal gait on right side , no arm drifts , lose of coordination on left side with dec reased sensation , no facial droop per EMS, CBG 164 , EKG PAC, slight slurred speech  walks with walker.

## 2020-07-11 NOTE — ED Provider Notes (Signed)
[facility] EMERGENCY DEPARTMENT Provider Note   CSN: [id number] Arrival date & time: 07/10/20  2340     History Chief Complaint  Patient presents with  . Code Stroke    Madison Maxwell is a 86 y.o. female.  HPI     This is a 86 year old female with a history of atrial fibrillation on Xarelto, osteoporosis, dementia who presents with reported increased confusion.  Patient came by EMS from [facility].  Last seen normal 2045.  Reported gait distubance and speech difficulty.  Hx of dementia.  Unknown baseline.  Presented as code stroke.  Patient unable to provide history.  Assessed by neurology upon arrival.  Doubt stroke and not a TPA candidate.  Level 5 caveat for dementia.  Past Medical History:  Diagnosis Date  . Allergic rhinitis   . Anxiety   . Duodenal ulcer   . External hemorrhoid   . Hyperlipidemia   . Memory deficit   . Osteoarthritis   . Osteoporosis   . PAF (paroxysmal atrial fibrillation) (HCC)    a. diagnosed in 05/2017. Started on Xarelto for anticoagulation.   . SUI (stress urinary incontinence), female   . Vitamin D deficiency     Patient Active Problem List   Diagnosis Date Noted  . Intracranial bleeding (HCC) 10/05/2017  . Fall from slip, trip, or stumble, initial encounter 10/05/2017  . Contusion of face 10/05/2017  . Hypertensive heart disease without CHF 06/07/2017  . Current use of long term anticoagulation 06/07/2017  . Leukopenia 06/06/2017  . Thrombocytopenia (HCC) 06/06/2017  . Paroxysmal atrial fibrillation (HCC) 06/06/2017  . History of CVA (cerebrovascular accident) 06/06/2017  . Gait disorder 12/23/2016  . Dyslipidemia 06/17/2016  . Ataxia   . Memory deficit     Past Surgical History:  Procedure Laterality Date  . APPENDECTOMY    . VAGINAL HYSTERECTOMY       OB History   No obstetric history on file.     Family History  Problem Relation Age of Onset  . Colon polyps Sister   . Colon cancer Neg Hx      Social History   Tobacco Use  . Smoking status: Never Smoker  . Smokeless tobacco: Never Used  Vaping Use  . Vaping Use: Never used  Substance Use Topics  . Alcohol use: No  . Drug use: No    Home Medications Prior to Admission medications   Medication Sig Start Date End Date Taking? Authorizing Provider  cephALEXin (KEFLEX) 500 MG capsule Take 1 capsule (500 mg total) by mouth 3 (three) times daily. 07/11/20   Laroy Mustard, Mayer Masker, MD  cholecalciferol (VITAMIN D) 1000 units tablet Take 1,000 Units daily with breakfast by mouth.     [provider]  CREON 36000 units CPEP capsule Take 8 capsules by mouth See admin instructions. Take 2 capsules with meals, and 1 capsule with snacks every day for a total of 8 capsules per day. 11/09/19   [provider]  galantamine (RAZADYNE ER) 16 MG 24 hr capsule Take 16 mg by mouth daily with breakfast.  10/29/18   [provider]  metoprolol tartrate (LOPRESSOR) 25 MG tablet Take 12.5 mg by mouth daily.     [provider]  Multiple Vitamins-Minerals (MULTIVITAMINS THER. W/MINERALS) TABS Take 1 tablet every evening by mouth.     [provider]  neomycin-polymyxin-hydrocortisone (CORTISPORIN) 3.5-10000-1 OTIC suspension Place 4 drops into the left ear 4 (four) times daily.  11/19/18   [provider]  nitrofurantoin, macrocrystal-monohydrate, (MACROBID) 100 MG capsule Take 100 mg by mouth 2 (two) times daily.  01/10/19   [provider]  omega-3 fish oil (MAXEPA) 1000 MG CAPS capsule Take 1 capsule daily by mouth.     [provider]  Probiotic Product (PROBIOTIC DAILY) CAPS Take 1 capsule daily at 6 PM by mouth.    [provider]  vitamin B-12 (CYANOCOBALAMIN) 1000 MCG tablet Take 1,000 mcg by mouth daily.    [provider]  vitamin C (ASCORBIC ACID) 500 MG tablet Take 500 mg by mouth daily.    [provider]    Allergies    Patient has no known  allergies.  Review of Systems   Review of Systems  Unable to perform ROS: Dementia    Physical Exam Updated Vital Signs BP (!) 162/72   Pulse 90   Temp 99 F (37.2 C) (Oral)   Resp 13   Ht 1.651 m (5\' 5" )   Wt 43 kg   SpO2 99%   BMI 15.78 kg/m   Physical Exam Vitals and nursing note reviewed.  Constitutional:      Appearance: She is well-developed.     Comments: Elderly and frail  HENT:     Head: Normocephalic and atraumatic.     Nose: Nose normal.     Mouth/Throat:     Mouth: Mucous membranes are dry.  Eyes:     Pupils: Pupils are equal, round, and reactive to light.  Cardiovascular:     Rate and Rhythm: Normal rate and regular rhythm.     Heart sounds: Normal heart sounds.  Pulmonary:     Effort: Pulmonary effort is normal. No respiratory distress.     Breath sounds: No wheezing.  Abdominal:     General: Bowel sounds are normal.     Palpations: Abdomen is soft.     Tenderness: There is no abdominal tenderness.  Musculoskeletal:     Cervical back: Neck supple.  Skin:    General: Skin is warm and dry.  Neurological:     Mental Status: She is alert.     Comments: Oriented to self, fluent speech, moves all four extremities, see neuro note for full exam  Psychiatric:     Comments: agitated     ED Results / Procedures / Treatments   Labs (all labs ordered are listed, but only abnormal results are displayed) Labs Reviewed  CBC - Abnormal; Notable for the following components:      Result Value   Platelets 67 (*)    All other components within normal limits  COMPREHENSIVE METABOLIC PANEL - Abnormal; Notable for the following components:   Glucose, Bld 146 (*)    Total Protein 5.9 (*)    Albumin 3.3 (*)    All other components within normal limits  URINALYSIS, ROUTINE W REFLEX MICROSCOPIC - Abnormal; Notable for the following components:   APPearance CLOUDY (*)    Hgb urine dipstick SMALL (*)    Nitrite POSITIVE (*)    Leukocytes,Ua SMALL (*)     Bacteria, UA FEW (*)    All other components within normal limits  I-STAT CHEM 8, ED - Abnormal; Notable for the following components:   Glucose, Bld 144 (*)    Calcium, Ion 1.10 (*)    All other components within normal limits  CBG MONITORING, ED - Abnormal; Notable for the following components:   Glucose-Capillary 131 (*)    All other components within normal limits  URINE CULTURE  DIFFERENTIAL  PROTIME-INR  APTT    EKG EKG Interpretation  Date/Time:  Wednesday July 11 2020 00:15:47 EDT Ventricular Rate:  88 PR Interval:    QRS Duration: 87 QT Interval:  359 QTC Calculation: 435 R Axis:   30 Text Interpretation: Sinus rhythm Ventricular trigeminy Probable left atrial enlargement RSR' in V1 or V2, probably normal variant Borderline ST depression, anterolateral leads Confirmed by Ross Marcus ([id number]) on 07/11/2020 2:55:01 AM   Radiology MR BRAIN WO CONTRAST  Result Date: 07/11/2020 CLINICAL DATA:  Initial evaluation for neuro deficit, stroke suspected. EXAM: MRI HEAD WITHOUT CONTRAST TECHNIQUE: Multiplanar, multiecho pulse sequences of the brain and surrounding structures were obtained without intravenous contrast. COMPARISON:  Prior CT from 07/10/2020. FINDINGS: Brain: Examination somewhat technically limited as the patient was unable to tolerate the full length of the exam. Additionally, provided images are somewhat degraded by motion artifact. Temporal lobe predominant cerebral atrophy. Patchy and confluent T2/FLAIR hyperintensity within the periventricular deep white matter both cerebral hemispheres most consistent with chronic small vessel ischemic disease. Superimposed remote lacunar infarct present at the posterior right basal ganglia/corona radiata. Mild patchy small vessel changes noted within the pons as well. No abnormal foci of restricted diffusion to suggest acute or subacute ischemia. Gray-white matter differentiation maintained. No encephalomalacia to suggest  chronic cortical infarction. No definite foci of susceptibility artifact to suggest acute or chronic intracranial hemorrhage. No mass lesion, midline shift or mass effect. Mild ventricular prominence related to global parenchymal volume loss without hydrocephalus. No extra-axial fluid collection. Pituitary gland within normal limits. Midline structures intact. Vascular: Major intracranial vascular flow voids maintained. Skull and upper cervical spine: Craniocervical junction within normal limits. Bone marrow signal intensity normal. No scalp soft tissue abnormality. Sinuses/Orbits: Patient status post bilateral ocular lens replacement. Paranasal sinuses are largely clear. Trace bilateral mastoid effusions, of doubtful significance. Inner ear structures grossly normal. Other: None. IMPRESSION: 1. No acute intracranial abnormality. 2. Generalized age-related cerebral atrophy with chronic small vessel ischemic disease. Superimposed remote lacunar infarct at the posterior right basal ganglia/corona radiata. Electronically Signed   By: Rise Mu M.D.   On: 07/11/2020 04:33   CT HEAD CODE STROKE WO CONTRAST  Result Date: 07/11/2020 CLINICAL DATA:  Code stroke. 86 year old female with left side weakness, abnormal gait. Hypertensive. EXAM: CT HEAD WITHOUT CONTRAST TECHNIQUE: Contiguous axial images were obtained from the base of the skull through the vertex without intravenous contrast. COMPARISON:  Head CT 02/15/2020 and earlier. FINDINGS: Brain: Chronic dural calcifications. No midline shift, mass effect, or evidence of intracranial mass lesion. No ventriculomegaly. No acute intracranial hemorrhage identified. Patchy and confluent bilateral cerebral white matter hypodensity appears stable since March. No cortically based acute infarct identified. Vascular: Calcified atherosclerosis at the skull base. No suspicious intracranial vascular hyperdensity. Skull: No acute osseous abnormality identified.  Sinuses/Orbits: Visualized paranasal sinuses and mastoids are stable and well pneumatized. Other: No acute orbit or scalp soft tissue finding. ASPECTS [facility] Stroke Program Early CT Score) Total score (0-10 with 10 being normal): 10 IMPRESSION: 1. No acute cortically based infarct or acute intracranial hemorrhage identified. ASPECTS 10. 2. Stable appearance of white matter disease. These results were communicated to Dr. Otelia Limes at 12:07 am on 07/11/2020 by text page via the [facility] messaging system. Electronically Signed   By: Odessa Fleming M.D.   On: 07/11/2020 00:08    Procedures Procedures (including critical care time)  Medications Ordered in ED Medications  sodium chloride flush (NS) 0.9 % injection 3 mL (  3 mLs Intravenous Given 07/11/20 0016)  aspirin chewable tablet 81 mg (81 mg Oral Given 07/11/20 0228)  cefTRIAXone (ROCEPHIN) 1 g in sodium chloride 0.9 % 100 mL IVPB (0 g Intravenous Stopped 07/11/20 0339)  LORazepam (ATIVAN) tablet 0.5 mg (0.5 mg Oral Given 07/11/20 0248)    ED Course  I have reviewed the triage vital signs and the nursing notes.  Pertinent labs & imaging results that were available during my care of the patient were reviewed by me and considered in my medical decision making (see chart for details).  Clinical Course as of Jul 12 455  Wed Jul 11, 2020  0211 Patient work-up reviewed.  No significant metabolic derangements.  She does have evidence of UTI with nitrite positive urine, 11-20 white cells and few bacteria.  Urine culture was placed and patient was given Rocephin.  Per neurology recommendations, will provide with aspirin and obtain MRI to rule out stroke.  Suspect mental status changes likely secondary to acute infection.  Patient does not appear septic at this time.   [CH]    Clinical Course User Index [CH] Allaina Brotzman, Mayer Maskerourtney F, MD   MDM Rules/Calculators/A&P                          Patient presents initially as a code stroke.  Increased confusion and reports of  gait disturbance and speech difficulty.  ABCs intact.  Patient was emergently taken to CT scan.  CT is negative for acute bleed.  Neurology evaluated the patient.  Does not feel her presentation is consistent with stroke.  Suspect more metabolic derangement or infectious source.  Will work out.  She is nontoxic and nonfocal on my exam.  She is confused and disoriented but has a history of dementia.  She is afebrile.  Lab work-up is largely unremarkable with exception of isolated thrombocytopenia.  No leukocytosis.  Urinalysis is nitrite positive with 11-20 white cells and few bacteria.  We will send urine culture.  Patient was given IV Rocephin to treat for UTI.  MRI obtained to rule out acute stroke.  MRI without evidence of acute stroke.  Patient's daughter-in-law was updated at the bedside.  Given that she has close monitoring at her facility and does not appear septic or febrile, feel it is reasonable that she be discharged with outpatient antibiotics.  Daughter-in-law is agreeable and states that she will do better in her normal surroundings.  We will plan for discharge with PCP follow-up.  After history, exam, and medical workup I feel the patient has been appropriately medically screened and is safe for discharge home. Pertinent diagnoses were discussed with the patient. Patient was given return precautions.   Final Clinical Impression(s) / ED Diagnoses Final diagnoses:  Acute cystitis without hematuria  Encephalopathy    Rx / DC Orders ED Discharge Orders         Ordered    cephALEXin (KEFLEX) 500 MG capsule  3 times daily     Discontinue  Reprint     07/11/20 0455           Wilkie AyeHorton, Mayer Maskerourtney F, MD 07/11/20 [phone]

## 2020-07-11 NOTE — Discharge Instructions (Addendum)
You were seen today for confusion.  You appear to have a urinary tract infection.  This can cause confusion especially in the elderly.  Your work-up for stroke was negative.  Neurology does recommend starting 81 mg of aspirin daily.  If you develop worsening confusion, weakness, fevers, back pain, nausea, vomiting, you should be reevaluated.

## 2020-07-11 NOTE — Code Documentation (Signed)
Responded to Code Stroke called at 2332 for L sided weakness and abnormal gait, [id number]. Pt arrived at 2340, CBG-131, NIH-4 for LOC questions, commands, and LUE ataxia , CT head-negative. Plan for metabolic workup.

## 2020-07-13 LAB — URINE CULTURE: Culture: >=100000 — AB

## 2020-08-31 DIAGNOSIS — Z85828 Personal history of other malignant neoplasm of skin: Secondary | ICD-10-CM | POA: Diagnosis not present

## 2020-08-31 DIAGNOSIS — I872 Venous insufficiency (chronic) (peripheral): Secondary | ICD-10-CM | POA: Diagnosis not present

## 2020-08-31 DIAGNOSIS — C44319 Basal cell carcinoma of skin of other parts of face: Secondary | ICD-10-CM | POA: Diagnosis not present

## 2020-08-31 DIAGNOSIS — C4401 Basal cell carcinoma of skin of lip: Secondary | ICD-10-CM | POA: Diagnosis not present

## 2020-08-31 DIAGNOSIS — I8311 Varicose veins of right lower extremity with inflammation: Secondary | ICD-10-CM | POA: Diagnosis not present

## 2020-08-31 DIAGNOSIS — I8312 Varicose veins of left lower extremity with inflammation: Secondary | ICD-10-CM | POA: Diagnosis not present

## 2020-09-03 DIAGNOSIS — Z20828 Contact with and (suspected) exposure to other viral communicable diseases: Secondary | ICD-10-CM | POA: Diagnosis not present

## 2020-09-03 DIAGNOSIS — Z1159 Encounter for screening for other viral diseases: Secondary | ICD-10-CM | POA: Diagnosis not present

## 2020-09-10 ENCOUNTER — Ambulatory Visit (INDEPENDENT_AMBULATORY_CARE_PROVIDER_SITE_OTHER): Payer: Medicare PPO | Admitting: Podiatry

## 2020-09-10 ENCOUNTER — Other Ambulatory Visit: Payer: Self-pay

## 2020-09-10 ENCOUNTER — Encounter: Payer: Self-pay | Admitting: Podiatry

## 2020-09-10 DIAGNOSIS — B351 Tinea unguium: Secondary | ICD-10-CM

## 2020-09-10 DIAGNOSIS — M79674 Pain in right toe(s): Secondary | ICD-10-CM

## 2020-09-10 DIAGNOSIS — L84 Corns and callosities: Secondary | ICD-10-CM

## 2020-09-10 DIAGNOSIS — M79675 Pain in left toe(s): Secondary | ICD-10-CM | POA: Diagnosis not present

## 2020-09-10 DIAGNOSIS — Z20828 Contact with and (suspected) exposure to other viral communicable diseases: Secondary | ICD-10-CM | POA: Diagnosis not present

## 2020-09-10 DIAGNOSIS — M79672 Pain in left foot: Secondary | ICD-10-CM | POA: Diagnosis not present

## 2020-09-10 DIAGNOSIS — Z1159 Encounter for screening for other viral diseases: Secondary | ICD-10-CM | POA: Diagnosis not present

## 2020-09-12 NOTE — Progress Notes (Signed)
Subjective:  Patient ID: Madison Maxwell, female    DOB: [birth year],  MRN: [id number]  86 y.o. female presents with painful callus(es) left foot and painful thick toenails that are difficult to trim. Pain interferes with ambulation. Aggravating factors include wearing enclosed shoe gear. Pain is relieved with periodic professional debridement.   Her son is present during today's visit. They voice no new pedal problems on today's visit. She resides at [facility] [place] with her husband. Her son assists them with doctor's appointments.  Review of Systems: Negative except as noted in the HPI. Denies N/V/F/Ch. Past Medical History:  Diagnosis Date  . Allergic rhinitis   . Anxiety   . Duodenal ulcer   . External hemorrhoid   . Hyperlipidemia   . Memory deficit   . Osteoarthritis   . Osteoporosis   . PAF (paroxysmal atrial fibrillation) (HCC)    a. diagnosed in 05/2017. Started on Xarelto for anticoagulation.   . SUI (stress urinary incontinence), female   . Vitamin D deficiency    Past Surgical History:  Procedure Laterality Date  . APPENDECTOMY    . VAGINAL HYSTERECTOMY     Patient Active Problem List   Diagnosis Date Noted  . Intracranial bleeding (HCC) 10/05/2017  . Fall from slip, trip, or stumble, initial encounter 10/05/2017  . Contusion of face 10/05/2017  . Hypertensive heart disease without CHF 06/07/2017  . Current use of long term anticoagulation 06/07/2017  . Leukopenia 06/06/2017  . Thrombocytopenia (HCC) 06/06/2017  . Paroxysmal atrial fibrillation (HCC) 06/06/2017  . History of CVA (cerebrovascular accident) 06/06/2017  . Gait disorder 12/23/2016  . Dyslipidemia 06/17/2016  . Ataxia   . Memory deficit     Current Outpatient Medications:  .  cephALEXin (KEFLEX) 500 MG capsule, Take 1 capsule (500 mg total) by mouth 3 (three) times daily., Disp: 21 capsule, Rfl: 0 .  cholecalciferol (VITAMIN D) 1000 units tablet, Take 1,000 Units daily with breakfast by mouth. ,  Disp: , Rfl:  .  CREON 36000 units CPEP capsule, Take 8 capsules by mouth See admin instructions. Take 2 capsules with meals, and 1 capsule with snacks every day for a total of 8 capsules per day., Disp: , Rfl:  .  galantamine (RAZADYNE ER) 16 MG 24 hr capsule, Take 16 mg by mouth daily with breakfast. , Disp: , Rfl:  .  metoprolol tartrate (LOPRESSOR) 25 MG tablet, Take 12.5 mg by mouth daily. , Disp: , Rfl:  .  Multiple Vitamins-Minerals (MULTIVITAMINS THER. W/MINERALS) TABS, Take 1 tablet every evening by mouth. , Disp: , Rfl:  .  omega-3 fish oil (MAXEPA) 1000 MG CAPS capsule, Take 1 capsule daily by mouth. , Disp: , Rfl:  .  Probiotic Product (PROBIOTIC DAILY) CAPS, Take 1 capsule daily at 6 PM by mouth., Disp: , Rfl:  .  triamcinolone ointment (KENALOG) 0.1 %, Apply topically at bedtime., Disp: , Rfl:  .  vitamin B-12 (CYANOCOBALAMIN) 1000 MCG tablet, Take 1,000 mcg by mouth daily., Disp: , Rfl:  .  vitamin C (ASCORBIC ACID) 500 MG tablet, Take 500 mg by mouth daily., Disp: , Rfl:  No Known Allergies Social History   Tobacco Use  Smoking Status Never Smoker  Smokeless Tobacco Never Used    Objective:   Constitutional Pt is a pleasant 86 y.o. Caucasian female, WD, WN in NAD.Marland Kitchen  Vascular Neurovascular status unchanged b/l lower extremities. Capillary refill time to digits immediate b/l. Palpable pedal pulses b/l LE. Pedal hair present. Lower extremity skin temperature  gradient within normal limits. No cyanosis or clubbing noted.  Neurologic Normal speech. Oriented to person, place, and time. Protective sensation intact 5/5 intact bilaterally with 10g monofilament b/l. Vibratory sensation intact b/l. Proprioception intact bilaterally.  Dermatologic Pedal skin with normal turgor, texture and tone bilaterally. No open wounds bilaterally. No interdigital macerations bilaterally. Toenails 1-5 b/l elongated, discolored, dystrophic, thickened, crumbly with subungual debris and tenderness to  dorsal palpation. Hyperkeratotic lesion(s) submet head 3 left foot.  No erythema, no edema, no drainage, no flocculence.   Orthopedic: Normal muscle strength 5/5 to all lower extremity muscle groups bilaterally. No pain crepitus or joint limitation noted with ROM b/l. Hallux valgus with bunion deformity noted b/l lower extremities.   Radiographs: None Assessment:   1. Pain due to onychomycosis of toenails of both feet   2. Callus   3. Pain in left foot    Plan:  Patient was evaluated and treated and all questions answered. Onychomycosis with pain -Nails palliatively debridement as below. -Educated on self-care  Procedure: Nail Debridement Rationale: Pain Type of Debridement: manual, sharp debridement. Instrumentation: Nail nipper, rotary burr. Number of Nails: 10  -Examined patient. -Toenails 1-5 b/l were debrided in length and girth with sterile nail nippers and dremel without iatrogenic bleeding.  -Right 2nd toe nailplate gently debrided from it's remaining attachment to digit. Nailbed cleansed with alcohol. Triple antibiotic ointment applied. No further treatment required. -Callus(es) submet head 3 left foot pared utilizing sterile scalpel blade without complication or incident. Total number debrided =1. -Patient to report any pedal injuries to medical professional immediately. -Patient to continue soft, supportive shoe gear daily. -Patient/POA to call should there be question/concern in the interim.  Return in about 3 months (around 12/11/2020).  Freddie Breech, DPM

## 2020-09-17 DIAGNOSIS — Z1159 Encounter for screening for other viral diseases: Secondary | ICD-10-CM | POA: Diagnosis not present

## 2020-09-17 DIAGNOSIS — Z20828 Contact with and (suspected) exposure to other viral communicable diseases: Secondary | ICD-10-CM | POA: Diagnosis not present

## 2020-09-24 DIAGNOSIS — Z1159 Encounter for screening for other viral diseases: Secondary | ICD-10-CM | POA: Diagnosis not present

## 2020-09-24 DIAGNOSIS — Z20828 Contact with and (suspected) exposure to other viral communicable diseases: Secondary | ICD-10-CM | POA: Diagnosis not present

## 2020-10-01 DIAGNOSIS — Z20828 Contact with and (suspected) exposure to other viral communicable diseases: Secondary | ICD-10-CM | POA: Diagnosis not present

## 2020-10-01 DIAGNOSIS — Z1159 Encounter for screening for other viral diseases: Secondary | ICD-10-CM | POA: Diagnosis not present

## 2020-10-02 DIAGNOSIS — I872 Venous insufficiency (chronic) (peripheral): Secondary | ICD-10-CM | POA: Diagnosis not present

## 2020-10-02 DIAGNOSIS — L821 Other seborrheic keratosis: Secondary | ICD-10-CM | POA: Diagnosis not present

## 2020-10-02 DIAGNOSIS — I8312 Varicose veins of left lower extremity with inflammation: Secondary | ICD-10-CM | POA: Diagnosis not present

## 2020-10-02 DIAGNOSIS — I8311 Varicose veins of right lower extremity with inflammation: Secondary | ICD-10-CM | POA: Diagnosis not present

## 2020-10-02 DIAGNOSIS — Z85828 Personal history of other malignant neoplasm of skin: Secondary | ICD-10-CM | POA: Diagnosis not present

## 2020-10-08 DIAGNOSIS — Z20828 Contact with and (suspected) exposure to other viral communicable diseases: Secondary | ICD-10-CM | POA: Diagnosis not present

## 2020-10-08 DIAGNOSIS — Z1159 Encounter for screening for other viral diseases: Secondary | ICD-10-CM | POA: Diagnosis not present

## 2020-10-10 DIAGNOSIS — E785 Hyperlipidemia, unspecified: Secondary | ICD-10-CM | POA: Diagnosis not present

## 2020-10-10 DIAGNOSIS — M810 Age-related osteoporosis without current pathological fracture: Secondary | ICD-10-CM | POA: Diagnosis not present

## 2020-10-10 DIAGNOSIS — R7301 Impaired fasting glucose: Secondary | ICD-10-CM | POA: Diagnosis not present

## 2020-10-15 DIAGNOSIS — Z20828 Contact with and (suspected) exposure to other viral communicable diseases: Secondary | ICD-10-CM | POA: Diagnosis not present

## 2020-10-15 DIAGNOSIS — Z1159 Encounter for screening for other viral diseases: Secondary | ICD-10-CM | POA: Diagnosis not present

## 2020-10-17 DIAGNOSIS — I6389 Other cerebral infarction: Secondary | ICD-10-CM | POA: Diagnosis not present

## 2020-10-17 DIAGNOSIS — R600 Localized edema: Secondary | ICD-10-CM | POA: Diagnosis not present

## 2020-10-17 DIAGNOSIS — M810 Age-related osteoporosis without current pathological fracture: Secondary | ICD-10-CM | POA: Diagnosis not present

## 2020-10-17 DIAGNOSIS — M25552 Pain in left hip: Secondary | ICD-10-CM | POA: Diagnosis not present

## 2020-10-17 DIAGNOSIS — F0390 Unspecified dementia without behavioral disturbance: Secondary | ICD-10-CM | POA: Diagnosis not present

## 2020-10-17 DIAGNOSIS — H6123 Impacted cerumen, bilateral: Secondary | ICD-10-CM | POA: Diagnosis not present

## 2020-10-17 DIAGNOSIS — Z0000 Encounter for general adult medical examination without abnormal findings: Secondary | ICD-10-CM | POA: Diagnosis not present

## 2020-10-17 DIAGNOSIS — R011 Cardiac murmur, unspecified: Secondary | ICD-10-CM | POA: Diagnosis not present

## 2020-10-17 DIAGNOSIS — I872 Venous insufficiency (chronic) (peripheral): Secondary | ICD-10-CM | POA: Diagnosis not present

## 2020-10-22 DIAGNOSIS — Z20828 Contact with and (suspected) exposure to other viral communicable diseases: Secondary | ICD-10-CM | POA: Diagnosis not present

## 2020-10-22 DIAGNOSIS — Z1159 Encounter for screening for other viral diseases: Secondary | ICD-10-CM | POA: Diagnosis not present

## 2020-10-29 DIAGNOSIS — H35372 Puckering of macula, left eye: Secondary | ICD-10-CM | POA: Diagnosis not present

## 2020-10-29 DIAGNOSIS — Z1159 Encounter for screening for other viral diseases: Secondary | ICD-10-CM | POA: Diagnosis not present

## 2020-10-29 DIAGNOSIS — H04123 Dry eye syndrome of bilateral lacrimal glands: Secondary | ICD-10-CM | POA: Diagnosis not present

## 2020-10-29 DIAGNOSIS — H52203 Unspecified astigmatism, bilateral: Secondary | ICD-10-CM | POA: Diagnosis not present

## 2020-10-29 DIAGNOSIS — H43813 Vitreous degeneration, bilateral: Secondary | ICD-10-CM | POA: Diagnosis not present

## 2020-10-29 DIAGNOSIS — Z20828 Contact with and (suspected) exposure to other viral communicable diseases: Secondary | ICD-10-CM | POA: Diagnosis not present

## 2020-11-02 ENCOUNTER — Encounter (HOSPITAL_COMMUNITY): Payer: Self-pay | Admitting: Emergency Medicine

## 2020-11-02 ENCOUNTER — Ambulatory Visit (HOSPITAL_COMMUNITY)
Admission: EM | Admit: 2020-11-02 | Discharge: 2020-11-02 | Disposition: A | Discharge status: Home / Self Care | Payer: Medicare PPO | Attending: Emergency Medicine | Admitting: Emergency Medicine

## 2020-11-02 ENCOUNTER — Other Ambulatory Visit: Payer: Self-pay

## 2020-11-02 DIAGNOSIS — R8281 Pyuria: Secondary | ICD-10-CM | POA: Diagnosis not present

## 2020-11-02 DIAGNOSIS — N3001 Acute cystitis with hematuria: Secondary | ICD-10-CM | POA: Diagnosis not present

## 2020-11-02 LAB — POCT URINALYSIS DIPSTICK, ED / UC
Bilirubin Urine: NEGATIVE
Glucose, UA: NEGATIVE mg/dL
Nitrite: POSITIVE — AB
Protein, ur: NEGATIVE mg/dL
Specific Gravity, Urine: >=1.03 (ref 1.005–1.030)
Urobilinogen, UA: 0.2 mg/dL (ref 0.0–1.0)
pH: 5.5 (ref 5.0–8.0)

## 2020-11-02 MED ORDER — CEPHALEXIN 500 MG PO CAPS: 500.0000 mg | ORAL_CAPSULE | Freq: Three times a day (TID) | ORAL | 0 refills | Status: AC

## 2020-11-02 NOTE — Discharge Instructions (Addendum)
Recommend start Keflex 500mg  3 times a day as directed. Continue to push fluids to help keep well hydrated. Follow-up pending urine culture results.

## 2020-11-02 NOTE — ED Triage Notes (Signed)
Pt presents with possible UTI. Son states was informed by retirement home that behavior has changed and had multiple falls in the past week.

## 2020-11-02 NOTE — ED Provider Notes (Signed)
[facility]    CSN: [id number] Arrival date & time: 11/02/20  1829      History   Chief Complaint Chief Complaint  Patient presents with  . Urinary Tract Infection    HPI Madison Maxwell is a 86 y.o. female.   86 year old female brought in by her son with concern over possible UTI. She has fallen twice in the past week at her retirement home and has had mild behavioral changes that have been present in the past when she has had a UTI. She denies any distinct fever, dysuria or hematuria. No distinct abdominal pain, back pain, nausea or vomiting. Has history of recurrent UTI- last one in August 2021. Urine culture grew out Proteus Mirabilis and was sensitive to most antibiotics including Keflex and Cipro but resistant to Nitrofurantoin. Other chronic health issues include HTN, history of CVA and Atrial fibrillation, osteoporosis, arthritis and memory deficit. Currently on Metoprolol, Galantamine, Creon and various vitamins and supplements.   The history is provided by the patient.    Past Medical History:  Diagnosis Date  . Allergic rhinitis   . Anxiety   . Duodenal ulcer   . External hemorrhoid   . Hyperlipidemia   . Memory deficit   . Osteoarthritis   . Osteoporosis   . PAF (paroxysmal atrial fibrillation) (HCC)    a. diagnosed in 05/2017. Started on Xarelto for anticoagulation.   . SUI (stress urinary incontinence), female   . Vitamin D deficiency     Patient Active Problem List   Diagnosis Date Noted  . Intracranial bleeding (HCC) 10/05/2017  . Fall from slip, trip, or stumble, initial encounter 10/05/2017  . Contusion of face 10/05/2017  . Hypertensive heart disease without CHF 06/07/2017  . Current use of long term anticoagulation 06/07/2017  . Leukopenia 06/06/2017  . Thrombocytopenia (HCC) 06/06/2017  . Paroxysmal atrial fibrillation (HCC) 06/06/2017  . History of CVA (cerebrovascular accident) 06/06/2017  . Gait disorder 12/23/2016  . Dyslipidemia  06/17/2016  . Ataxia   . Memory deficit     Past Surgical History:  Procedure Laterality Date  . APPENDECTOMY    . VAGINAL HYSTERECTOMY      OB History   No obstetric history on file.      Home Medications    Prior to Admission medications   Medication Sig Start Date End Date Taking? Authorizing Provider  cephALEXin (KEFLEX) 500 MG capsule Take 1 capsule (500 mg total) by mouth 3 (three) times daily for 7 days. 11/02/20 11/09/20  Sudie Grumbling, NP  cholecalciferol (VITAMIN D) 1000 units tablet Take 1,000 Units daily with breakfast by mouth.     [provider]  CREON 36000 units CPEP capsule Take 8 capsules by mouth See admin instructions. Take 2 capsules with meals, and 1 capsule with snacks every day for a total of 8 capsules per day. 11/09/19   [provider]  galantamine (RAZADYNE ER) 16 MG 24 hr capsule Take 16 mg by mouth daily with breakfast.  10/29/18   [provider]  metoprolol tartrate (LOPRESSOR) 25 MG tablet Take 12.5 mg by mouth daily.     [provider]  Multiple Vitamins-Minerals (MULTIVITAMINS THER. W/MINERALS) TABS Take 1 tablet every evening by mouth.     [provider]  omega-3 fish oil (MAXEPA) 1000 MG CAPS capsule Take 1 capsule daily by mouth.     [provider]  Probiotic Product (PROBIOTIC DAILY) CAPS Take 1 capsule daily at 6 PM  by mouth.    [provider]  triamcinolone ointment (KENALOG) 0.1 % Apply topically at bedtime. 09/04/20   [provider]  vitamin B-12 (CYANOCOBALAMIN) 1000 MCG tablet Take 1,000 mcg by mouth daily.    [provider]  vitamin C (ASCORBIC ACID) 500 MG tablet Take 500 mg by mouth daily.    [provider]    Family History Family History  Problem Relation Age of Onset  . Colon polyps Sister   . Colon cancer Neg Hx     Social History Social History   Tobacco Use  . Smoking status: Never Smoker  . Smokeless tobacco: Never Used    Vaping Use  . Vaping Use: Never used  Substance Use Topics  . Alcohol use: No  . Drug use: No     Allergies   Patient has no known allergies.   Review of Systems Review of Systems  Constitutional: Negative for activity change, appetite change, chills, fatigue and fever.  Gastrointestinal: Negative for abdominal pain, nausea and vomiting.  Genitourinary: Negative for difficulty urinating, dysuria, flank pain, hematuria, pelvic pain and urgency.  Musculoskeletal: Positive for arthralgias and gait problem (uses walker for assistance). Negative for back pain.  Skin: Negative for color change and rash.  Allergic/Immunologic: Positive for environmental allergies. Negative for food allergies.  Neurological: Positive for light-headedness (occasional). Negative for tremors, seizures, syncope, facial asymmetry and speech difficulty.  Hematological: Negative for adenopathy. Bruises/bleeds easily.     Physical Exam Triage Vital Signs ED Triage Vitals  Enc Vitals Group     BP 11/02/20 2003 (!) 165/58     Pulse Rate 11/02/20 2003 65     Resp 11/02/20 2003 16     Temp 11/02/20 2003 97.9 F (36.6 C)     Temp Source 11/02/20 2003 Oral     SpO2 11/02/20 2003 100 %     Weight --      Height --      Head Circumference --      Peak Flow --      Pain Score 11/02/20 2001 0     Pain Loc --      Pain Edu? --      Excl. in GC? --    No data found.  Updated Vital Signs BP (!) 165/58 (BP Location: Right Arm)   Pulse 65   Temp 97.9 F (36.6 C) (Oral)   Resp 16   SpO2 100%   Visual Acuity Right Eye Distance:   Left Eye Distance:   Bilateral Distance:    Right Eye Near:   Left Eye Near:    Bilateral Near:     Physical Exam Vitals and nursing note reviewed.  Constitutional:      General: She is awake. She is not in acute distress.    Appearance: She is well-developed and underweight.     Comments: She is sitting in the exam chair in no acute distress.   HENT:     Head:  Normocephalic and atraumatic.     Right Ear: Decreased hearing noted.     Left Ear: Decreased hearing noted.  Cardiovascular:     Rate and Rhythm: Normal rate.     Heart sounds: Normal heart sounds.  Pulmonary:     Effort: Pulmonary effort is normal. No respiratory distress.     Breath sounds: Normal breath sounds and air entry. No decreased air movement. No decreased breath sounds, wheezing, rhonchi or rales.  Abdominal:     General:  Abdomen is flat. There is no distension.     Palpations: Abdomen is soft.     Tenderness: There is no abdominal tenderness. There is no right CVA tenderness, left CVA tenderness, guarding or rebound.  Skin:    General: Skin is warm and dry.  Neurological:     General: No focal deficit present.     Mental Status: She is alert.     Comments: Patient has difficulty hearing, especially with face masks on other people which muffles sounds. She appears alert but has brief moments of confusion.   Psychiatric:        Mood and Affect: Mood normal.        Behavior: Behavior normal. Behavior is cooperative.        Thought Content: Thought content normal.        Judgment: Judgment normal.      UC Treatments / Results  Labs (all labs ordered are listed, but only abnormal results are displayed) Labs Reviewed  POCT URINALYSIS DIPSTICK, ED / UC - Abnormal; Notable for the following components:      Result Value   Ketones, ur TRACE (*)    Hgb urine dipstick TRACE (*)    Nitrite POSITIVE (*)    Leukocytes,Ua SMALL (*)    All other components within normal limits  URINE CULTURE    EKG   Radiology No results found.  Procedures Procedures (including critical care time)  Medications Ordered in UC Medications - No data to display  Initial Impression / Assessment and Plan / UC Course  I have reviewed the triage vital signs and the nursing notes.  Pertinent labs & imaging results that were available during my care of the patient were reviewed by me and  considered in my medical decision making (see chart for details).    Reviewed urinalysis results with patient and son. Positive WBC's, nitrites, blood and ketones. Discussed that she appears to have another UTI. Urine sent for culture. Since she responded well to Keflex in the past without side effects, will start Keflex 500mg  3 times a day for 7 days. Encouraged to continue to push fluids to keep hydrated. Follow-up pending urine culture results.  Final Clinical Impressions(s) / UC Diagnoses   Final diagnoses:  Acute cystitis with hematuria  Pyuria     Discharge Instructions     Recommend start Keflex 500mg  3 times a day as directed. Continue to push fluids to help keep well hydrated. Follow-up pending urine culture results.     ED Prescriptions    Medication Sig Dispense Auth. Provider   cephALEXin (KEFLEX) 500 MG capsule Take 1 capsule (500 mg total) by mouth 3 (three) times daily for 7 days. 21 capsule Hollan Philipp, , NP     PDMP not reviewed this encounter.   , NP 11/02/20 2300

## 2020-11-04 LAB — URINE CULTURE: Special Requests: NORMAL

## 2020-11-12 DIAGNOSIS — H6123 Impacted cerumen, bilateral: Secondary | ICD-10-CM | POA: Insufficient documentation

## 2020-11-12 DIAGNOSIS — H903 Sensorineural hearing loss, bilateral: Secondary | ICD-10-CM | POA: Insufficient documentation

## 2020-11-12 DIAGNOSIS — H61892 Other specified disorders of left external ear: Secondary | ICD-10-CM | POA: Diagnosis not present

## 2020-11-19 DIAGNOSIS — Z1159 Encounter for screening for other viral diseases: Secondary | ICD-10-CM | POA: Diagnosis not present

## 2020-11-19 DIAGNOSIS — Z20828 Contact with and (suspected) exposure to other viral communicable diseases: Secondary | ICD-10-CM | POA: Diagnosis not present

## 2020-11-20 ENCOUNTER — Telehealth: Payer: Self-pay

## 2020-11-20 ENCOUNTER — Other Ambulatory Visit (HOSPITAL_COMMUNITY): Payer: Self-pay | Admitting: *Deleted

## 2020-11-20 NOTE — Telephone Encounter (Signed)
Spoke with patients son Smitty Cords and scheduled an in-person Palliative Consult for 11/27/20 @ 12:30PM. Son requested afternoon appointment.    COVID screening was negative. No pets in home. Patient lives with husband. Husband is current Hospice patient.   Consent obtained; updated Outlook/Netsmart/Team List and Epic.

## 2020-11-21 ENCOUNTER — Other Ambulatory Visit: Payer: Self-pay

## 2020-11-21 ENCOUNTER — Ambulatory Visit (HOSPITAL_COMMUNITY)
Admission: RE | Admit: 2020-11-21 | Discharge: 2020-11-21 | Disposition: A | Discharge status: Home / Self Care | Payer: Medicare PPO | Source: Ambulatory Visit | Attending: Internal Medicine | Admitting: Internal Medicine

## 2020-11-21 DIAGNOSIS — M810 Age-related osteoporosis without current pathological fracture: Secondary | ICD-10-CM | POA: Diagnosis not present

## 2020-11-21 MED ORDER — DENOSUMAB 60 MG/ML ~~LOC~~ SOSY
60.0000 mg | PREFILLED_SYRINGE | Freq: Once | SUBCUTANEOUS | Status: AC
  Administered 2020-11-21: 14:00:00 60 mg via SUBCUTANEOUS

## 2020-11-21 MED ORDER — DENOSUMAB 60 MG/ML ~~LOC~~ SOSY
PREFILLED_SYRINGE | SUBCUTANEOUS | Status: AC
  Filled 2020-11-21: qty 1

## 2020-11-27 ENCOUNTER — Other Ambulatory Visit: Payer: Self-pay

## 2020-11-27 ENCOUNTER — Non-Acute Institutional Stay: Payer: Self-pay | Admitting: Hospice

## 2020-11-27 DIAGNOSIS — Z515 Encounter for palliative care: Secondary | ICD-10-CM

## 2020-11-27 DIAGNOSIS — I119 Hypertensive heart disease without heart failure: Secondary | ICD-10-CM

## 2020-11-27 NOTE — Progress Notes (Signed)
[facility] Consult Note Telephone: [phone]  Fax: [phone]  PATIENT NAME: Madison Maxwell [place] [address] [phone] (home)  DOB: 17-Jul-1920 MRN: [id number]  PRIMARY CARE PROVIDER:    Crist Infante, MD,  [place] [address] [phone]  REFERRING PROVIDER:   Crist Infante, MD [address] [place],  [place] [address] [phone]  RESPONSIBLE PARTY:   Extended Emergency Contact Information Primary Emergency Contact: [facility] Address: [address]          [place], [place] [address] Johnnette Litter of [place] Phone: [phone] Mobile Phone: [phone] Relation: Son Secondary Emergency Contact: [facility] Address: [place], [place] [address] Johnnette Litter of [place] Phone: [phone] Relation: Daughter Mylani Gentry, Synthia Innocent wife [phone]. Couple lives in [place]; call [facility] during or after visit  I met face to face with patient and family in home/facility.  RECOMMENDATIONS/PLAN:    Visit at the request of Dr. Crist Infante  for palliative consult. Visit consisted of building trust and discussions on Palliative Medicine as specialized medical care for people living with serious illness, aimed at facilitating better quality of life through symptoms relief, assisting with advance care plan and establishing complex decision making.  Discussion on the difference between Palliative and Hospice care. Palliative care and hospice have similar goals of managing symptoms, promoting comfort, improving quality of life, and maintaining a person's dignity. However, palliative care may be offered during any phase of a serious illness, while hospice care is usually offered when a person is expected to live for 6 months or less.  Visit consisted of counseling and education dealing with the complex and emotionally intense issues of symptom  management and palliative care in the setting of serious and potentially life-threatening illness. Palliative care team will continue to support patient, patient's family, and medical team.  Advance Care Planning/CODE STATUS: CODE STATUS reviewed.  Patient is a DO NOT RESUSCITATE.  Signed DNR uploaded to epic today.  GOALS OF CARE: Goals of care include to maximize quality of life and symptom management.  Goals of care clarification discussion today using medical orders for scope of treatment.  Most selections include DO NOT RESUSCITATE, limited additional intervention, IV fluids as indicated, antibiotics as indicated, feeding tube for defined trial.. Follow up Palliative Care Visit: Palliative care will continue to follow for goals of care clarification and symptom management.  Symptom Management: Patient is cooperative, in no acute distress. Memory loss/confusion related to Dementia is ongoing, sometimes agitated and refusing help, per son. Education provided on the terminal and progressive nature of dementia and what to expect. Continue Galatamine as ordered.  Redirection and distraction encouraged. Patient is incontinent of urine, ambulatory with her rolling walker.  Creon for digestive issues; effective.  Palliative will continue to monitor for symptom management/decline and make recommendations as needed. Family /Caregiver/Community Supports: Patient lives in [place] with her husband who is currently on Hospice care. Patient gets personal care from facility [facility]- medication management/reminders I spent 1 hour and 46 minutes providing this initial consultation; time includes time spent with patient/family, chart review, provider coordination,  and documentation. More than 50% of the time in this consultation was spent on counseling patient and coordinating communication.   CHIEF COMPLAIN/HISTORY OF PRESENT ILLNESS:  Madison Maxwell is a 86 y.o. female with multiple medical problems including   Dementia, Hypertensive heart disease, Afib, hx of stroke. History  obtained from review of EMR, discussion with Hilbert Odor wife and interview with patient.   Palliative Care was asked to follow this patient by consultation request of Crist Infante, MD to help address advance care planning and complex decision making. Thank you for the opportunity to participate in the care of Madison Maxwell    CODE STATUS: DNR  PPS: 60%  HOSPICE ELIGIBILITY/DIAGNOSIS: TBD  PAST MEDICAL HISTORY:  Past Medical History:  Diagnosis Date  . Allergic rhinitis   . Anxiety   . Duodenal ulcer   . External hemorrhoid   . Hyperlipidemia   . Memory deficit   . Osteoarthritis   . Osteoporosis   . PAF (paroxysmal atrial fibrillation) ([facility])    a. diagnosed in 05/2017. Started on Xarelto for anticoagulation.   . SUI (stress urinary incontinence), female   . Vitamin D deficiency     SOCIAL HX:  Social History   Tobacco Use  . Smoking status: Never Smoker  . Smokeless tobacco: Never Used  Substance Use Topics  . Alcohol use: No   FAMILY HX:  Family History  Problem Relation Age of Onset  . Colon polyps Sister   . Colon cancer Neg Hx     ALLERGIES: No Known Allergies   PERTINENT MEDICATIONS:  Outpatient Encounter Medications as of 11/27/2020  Medication Sig  . cholecalciferol (VITAMIN D) 1000 units tablet Take 1,000 Units daily with breakfast by mouth.   . CREON 36000 units CPEP capsule Take 8 capsules by mouth See admin instructions. Take 2 capsules with meals, and 1 capsule with snacks every day for a total of 8 capsules per day.  . galantamine (RAZADYNE ER) 16 MG 24 hr capsule Take 16 mg by mouth daily with breakfast.   . metoprolol tartrate (LOPRESSOR) 25 MG tablet Take 12.5 mg by mouth daily.   . Multiple Vitamins-Minerals (MULTIVITAMINS THER. W/MINERALS) TABS Take 1 tablet every evening by mouth.   . omega-3 fish oil (MAXEPA) 1000 MG CAPS capsule Take 1 capsule daily by mouth.   . Probiotic  Product (PROBIOTIC DAILY) CAPS Take 1 capsule daily at 6 PM by mouth.  . triamcinolone ointment (KENALOG) 0.1 % Apply topically at bedtime.  . vitamin B-12 (CYANOCOBALAMIN) 1000 MCG tablet Take 1,000 mcg by mouth daily.  . vitamin C (ASCORBIC ACID) 500 MG tablet Take 500 mg by mouth daily.   No facility-administered encounter medications on file as of 11/27/2020.   Physical Exam/ROS: General: NAD, frail appearing Cardiovascular: regular rate and rhythm; denies chest pain Pulmonary: clear ant/post fields; normal respiratory effort Abdomen: soft, nontender, + bowel sounds; no constipation GU: no suprapubic tenderness Extremities: no edema, no joint deformities Skin: Dry skin on bilateral lower legs, encouraged the use of triamcinolone as ordered; encouraged the use of moisturizer CeraVe Neurological: Weakness but otherwise nonfocal  Note:  Portions of this note were generated with Dragon dictation software. Dictation errors may occur despite attempts at proofreading.   Teodoro Spray, NP

## 2020-12-03 DIAGNOSIS — Z1159 Encounter for screening for other viral diseases: Secondary | ICD-10-CM | POA: Diagnosis not present

## 2020-12-03 DIAGNOSIS — Z20828 Contact with and (suspected) exposure to other viral communicable diseases: Secondary | ICD-10-CM | POA: Diagnosis not present

## 2020-12-10 DIAGNOSIS — Z20828 Contact with and (suspected) exposure to other viral communicable diseases: Secondary | ICD-10-CM | POA: Diagnosis not present

## 2020-12-10 DIAGNOSIS — Z1159 Encounter for screening for other viral diseases: Secondary | ICD-10-CM | POA: Diagnosis not present

## 2020-12-17 ENCOUNTER — Ambulatory Visit: Payer: Medicare PPO | Admitting: Podiatry

## 2020-12-24 DIAGNOSIS — Z20828 Contact with and (suspected) exposure to other viral communicable diseases: Secondary | ICD-10-CM | POA: Diagnosis not present

## 2020-12-24 DIAGNOSIS — Z1159 Encounter for screening for other viral diseases: Secondary | ICD-10-CM | POA: Diagnosis not present

## 2021-01-14 DIAGNOSIS — Z20828 Contact with and (suspected) exposure to other viral communicable diseases: Secondary | ICD-10-CM | POA: Diagnosis not present

## 2021-01-14 DIAGNOSIS — Z1159 Encounter for screening for other viral diseases: Secondary | ICD-10-CM | POA: Diagnosis not present

## 2021-01-21 DIAGNOSIS — Z20828 Contact with and (suspected) exposure to other viral communicable diseases: Secondary | ICD-10-CM | POA: Diagnosis not present

## 2021-01-21 DIAGNOSIS — Z1159 Encounter for screening for other viral diseases: Secondary | ICD-10-CM | POA: Diagnosis not present

## 2021-01-25 ENCOUNTER — Other Ambulatory Visit: Payer: Self-pay

## 2021-01-25 ENCOUNTER — Ambulatory Visit: Payer: Medicare PPO | Admitting: Podiatry

## 2021-01-25 DIAGNOSIS — M79674 Pain in right toe(s): Secondary | ICD-10-CM

## 2021-01-25 DIAGNOSIS — M79672 Pain in left foot: Secondary | ICD-10-CM

## 2021-01-25 DIAGNOSIS — B351 Tinea unguium: Secondary | ICD-10-CM | POA: Diagnosis not present

## 2021-01-25 DIAGNOSIS — M79675 Pain in left toe(s): Secondary | ICD-10-CM | POA: Diagnosis not present

## 2021-01-25 DIAGNOSIS — L84 Corns and callosities: Secondary | ICD-10-CM

## 2021-01-27 ENCOUNTER — Encounter: Payer: Self-pay | Admitting: Podiatry

## 2021-01-27 NOTE — Progress Notes (Signed)
  Subjective:  Patient ID: Madison Maxwell, female    DOB: [birth year],  MRN: [id number]  87 y.o. female presents with painful callus(es) left foot and painful thick toenails that are difficult to trim. Pain interferes with ambulation. Aggravating factors include wearing enclosed shoe gear. Pain is relieved with periodic professional debridement.   Her daughter, Larene Beach, is present during today's visit.. They voice no new pedal problems on today's visit. She still resides at [facility] with her husband.   Review of Systems: Negative except as noted in the HPI. Denies N/V/F/Ch  .No Known Allergies  Objective:   Constitutional Pt is a pleasant 87 y.o. Caucasian female, WD, WN in NAD.Marland Kitchen  Vascular Neurovascular status unchanged b/l lower extremities. Capillary refill time to digits immediate b/l. Palpable pedal pulses b/l LE. Pedal hair present. Lower extremity skin temperature gradient within normal limits. No cyanosis or clubbing noted.  Neurologic Normal speech. Oriented to person, place, and time. Protective sensation intact 5/5 intact bilaterally with 10g monofilament b/l. Vibratory sensation intact b/l. Proprioception intact bilaterally.  Dermatologic Pedal skin with normal turgor, texture and tone bilaterally. No open wounds bilaterally. No interdigital macerations bilaterally. Toenails 1-5 b/l elongated, discolored, dystrophic, thickened, crumbly with subungual debris and tenderness to dorsal palpation. Hyperkeratotic lesion(s) submet head 3 left foot.  No erythema, no edema, no drainage, no flocculence.   Orthopedic: Normal muscle strength 5/5 to all lower extremity muscle groups bilaterally. No pain crepitus or joint limitation noted with ROM b/l. Hallux valgus with bunion deformity noted b/l lower extremities.   Radiographs: None Assessment:   1. Pain due to onychomycosis of toenails of both feet   2. Callus   3. Pain in left foot    Plan:  Patient was evaluated and treated and all  questions answered. Onychomycosis with pain -Nails palliatively debridement as below. -Educated on self-care  Procedure: Nail Debridement Rationale: Pain Type of Debridement: manual, sharp debridement. Instrumentation: Nail nipper, rotary burr. Number of Nails: 10  -Examined patient. -Toenails 1-5 b/l were debrided in length and girth with sterile nail nippers and dremel without iatrogenic bleeding.  -Callus(es) submet head 3 left foot pared utilizing sterile scalpel blade without complication or incident. Total number debrided =1. -Patient to report any pedal injuries to medical professional immediately. -Patient to continue soft, supportive shoe gear daily. -Patient/POA to call should there be question/concern in the interim.  Return in about 3 months (around 04/24/2021).  Freddie Breech, DPM

## 2021-01-28 DIAGNOSIS — Z1159 Encounter for screening for other viral diseases: Secondary | ICD-10-CM | POA: Diagnosis not present

## 2021-01-28 DIAGNOSIS — Z20828 Contact with and (suspected) exposure to other viral communicable diseases: Secondary | ICD-10-CM | POA: Diagnosis not present

## 2021-01-29 ENCOUNTER — Other Ambulatory Visit: Payer: Self-pay

## 2021-01-29 ENCOUNTER — Other Ambulatory Visit: Payer: Self-pay | Admitting: Hospice

## 2021-01-29 DIAGNOSIS — Z515 Encounter for palliative care: Secondary | ICD-10-CM | POA: Diagnosis not present

## 2021-01-29 DIAGNOSIS — R413 Other amnesia: Secondary | ICD-10-CM

## 2021-01-29 DIAGNOSIS — F0391 Unspecified dementia with behavioral disturbance: Secondary | ICD-10-CM | POA: Diagnosis not present

## 2021-01-29 NOTE — Progress Notes (Signed)
Therapist, nutritional Palliative Care Consult Note Telephone: [phone]  Fax: [phone]  PATIENT NAME: Madison Maxwell DOB: Aug 29, 1920 MRN: [id number]  PRIMARY CARE PROVIDER:   Rodrigo Ran, MD Rodrigo Ran, MD [address] [place],  [place] [address]  REFERRING PROVIDER: Rodrigo Ran, MD Rodrigo Ran, MD [address] [place],  [place] [address]  RESPONSIBLE PARTY:   Extended Emergency Contact Information Primary Emergency Contact: [facility] Address: [address]          [place], [place] [address] Darden Amber of [place] Home Phone: [phone] Mobile Phone: [phone] Relation: Son Secondary Emergency Contact: [facility] Address: [address] [place], [place] [address] [place] of [place] Home Phone: [phone] Relation: Daughter Jamel Dunton, Bruce's wife [phone]. Couple lives in [place]; call [place] during or after visit  Visit is to build trust and highlight Palliative Medicine as specialized medical care for people living with serious illness, aimed at facilitating better quality of life through symptoms relief, assisting with advance care plan and establishing goals of care. NP called Bruce and updated him on visit. He expressed appreciation for the call.   CHIEF COMPLAINT: Palliative visit/memory loss  RECOMMENDATIONS/PLAN:  Advance Care Planning/CODE STATUS: CODE STATUS reviewed.  Patient is a DO NOT RESUSCITATE.  Signed DNR uploaded to epic.  GOALS OF CARE: Goals of care include to maximize quality of life and symptom management.  MOST selections include DO NOT RESUSCITATE, limited additional intervention, IV fluids as indicated, antibiotics as indicated, feeding tube for defined trial.  Visit consisted of counseling and education dealing with the complex and emotionally intense issues of symptom management and palliative care in the setting of serious and potentially life-threatening illness.  Palliative care team will continue to support patient, patient's family, and medical team.  I spent 46  minutes providing this consultation. More than 50% of the time in this consultation was spent on coordinating communication.  -------------------------------------------------------------------------------------------------------------------------------------------------- 3. Symptom management/Plan:  Memory loss: Related to worsening dementia in line with disease trajectory.  Encourage reminiscence, word search/puzzles, cueing for recollection.  Promote calm approach and engaging environment.  Continue Galantamine as currently ordered.  Continue ongoing supportive care. Palliative will continue to monitor for symptom management/decline and make recommendations as needed. Return 2 months or prn. Encouraged to call provider sooner with any concerns.   HISTORY OF PRESENT ILLNESS:  Madison Maxwell is a 87 y.o. female with multiple medical problems including memory loss related to Alzheimer dementia.  Memory loss is chronic, worsening in line with dementia disease trajectory; this affects patient's quality of life and independence.  Nursing supervisor Philmore Pali  reports memory loss is worse as the day progresses; cues for recollection sometimes helpful. History of  Hypertensive heart disease, Afib, hx of stroke. History obtained from review of EMR, discussion with patient/family. Review and summarization of Epic records shows history from other than patient. Rest of 10 point ROS asked and negative. Palliative Care was asked to follow this patient by consultation request of Rodrigo Ran, MD to help address complex decision making in the context of goals of care.   CODE STATUS: DNR  PPS: 60%  HOSPICE ELIGIBILITY/DIAGNOSIS: TBD  PAST MEDICAL HISTORY:  Past Medical History:  Diagnosis Date  . Allergic rhinitis   . Anxiety   . Duodenal ulcer   . External hemorrhoid   . Hyperlipidemia   . Memory deficit    . Osteoarthritis   . Osteoporosis   .  PAF (paroxysmal atrial fibrillation) (HCC)    a. diagnosed in 05/2017. Started on Xarelto for anticoagulation.   . SUI (stress urinary incontinence), female   . Vitamin D deficiency     SOCIAL HX: @SOCX  Patient at home with spouse  for ongoing care   FAMILY HX:  Family History  Problem Relation Age of Onset  . Colon polyps Sister   . Colon cancer Neg Hx     Review lab tests/diagnostics No results for input(s): WBC, HGB, HCT, PLT, MCV in the last 168 hours. No results for input(s): NA, K, CL, CO2, BUN, CREATININE, GLUCOSE in the last 168 hours. Latest GFR by Cockcroft Gault (not valid in AKI or ESRD) CrCl cannot be calculated (Patient's most recent lab result is older than the maximum 21 days allowed.). No results for input(s): AST, ALT, ALKPHOS, GGT in the last 168 hours.  Invalid input(s): TBILI, CONJBILI, ALB, TOTALPROTEIN No components found for: ALB No results for input(s): APTT, INR in the last 168 hours.  Invalid input(s): PTPATIENT No results for input(s): BNP, PROBNP in the last 168 hours.  ALLERGIES: No Known Allergies    PERTINENT MEDICATIONS:  Outpatient Encounter Medications as of 01/29/2021  Medication Sig  . cholecalciferol (VITAMIN D) 1000 units tablet Take 1,000 Units daily with breakfast by mouth.   . CREON 36000 units CPEP capsule Take 8 capsules by mouth See admin instructions. Take 2 capsules with meals, and 1 capsule with snacks every day for a total of 8 capsules per day.  . galantamine (RAZADYNE ER) 16 MG 24 hr capsule Take 16 mg by mouth daily with breakfast.   . metoprolol tartrate (LOPRESSOR) 25 MG tablet Take 12.5 mg by mouth daily.   . Multiple Vitamins-Iron (MULTIVITAMINS WITH IRON) TABS tablet Take by mouth.  . Multiple Vitamins-Minerals (MULTIVITAMINS THER. W/MINERALS) TABS Take 1 tablet every evening by mouth.   . omega-3 fish oil (MAXEPA) 1000 MG CAPS capsule Take 1 capsule daily by mouth.   . Probiotic  Product (PROBIOTIC DAILY) CAPS Take 1 capsule daily at 6 PM by mouth.  . triamcinolone ointment (KENALOG) 0.1 % Apply topically at bedtime.  . vitamin B-12 (CYANOCOBALAMIN) 1000 MCG tablet Take 1,000 mcg by mouth daily.  . vitamin C (ASCORBIC ACID) 500 MG tablet Take 500 mg by mouth daily.   No facility-administered encounter medications on file as of 01/29/2021.    ROS  General: NAD EYES: denies vision changes ENMT: denies dysphagia no xerostomia Cardiovascular: denies chest pain Pulmonary: denies  cough, denies SOB  Abdomen: endorses fair appetite, no constipation or diarrhea GU: denies dysuria or urinary frquency MSK:  endorses ROM limitations, no falls reported Skin: denies rashes or wounds Neurological: endorses weakness, denies pain, denies insomnia Psych: Endorses positive mood Heme/lymph/immuno: denies bruises, abnormal bleeding   PHYSICAL EXAM  General: In no acute distress, cooperative Cardiovascular: regular rate and rhythm;no edema in BLE Pulmonary: no cough, no increased work of breathing, normal respiratory effort Abdomen: soft, non tender, positive bowel sounds in all quadrants GU:  no suprapubic tenderness Eyes: Normal lids, no discharge, sclera anicteric ENMT: Moist mucous membranes Musculoskeletal:  Ambulatory with assistive device Skin: no rash to visible skin, warm without cyanosis Psych: non-anxious affect Neurological: Weakness but otherwise non focal Heme/lymph/immuno: no bruises, no bleeding  Thank you for the opportunity to participate in the care of Madison Maxwell Please call our office at [phone] if we can be of additional assistance.  Note: Portions of this note were generated with [phone]  dictation software. Dictation errors may occur despite best attempts at proofreading.  Rosaura Carpenter, NP

## 2021-02-04 DIAGNOSIS — Z20828 Contact with and (suspected) exposure to other viral communicable diseases: Secondary | ICD-10-CM | POA: Diagnosis not present

## 2021-02-04 DIAGNOSIS — Z1159 Encounter for screening for other viral diseases: Secondary | ICD-10-CM | POA: Diagnosis not present

## 2021-02-11 DIAGNOSIS — Z1159 Encounter for screening for other viral diseases: Secondary | ICD-10-CM | POA: Diagnosis not present

## 2021-02-11 DIAGNOSIS — Z20828 Contact with and (suspected) exposure to other viral communicable diseases: Secondary | ICD-10-CM | POA: Diagnosis not present

## 2021-02-18 DIAGNOSIS — Z20828 Contact with and (suspected) exposure to other viral communicable diseases: Secondary | ICD-10-CM | POA: Diagnosis not present

## 2021-02-18 DIAGNOSIS — Z1159 Encounter for screening for other viral diseases: Secondary | ICD-10-CM | POA: Diagnosis not present

## 2021-02-20 DIAGNOSIS — I480 Paroxysmal atrial fibrillation: Secondary | ICD-10-CM | POA: Diagnosis not present

## 2021-02-20 DIAGNOSIS — I6389 Other cerebral infarction: Secondary | ICD-10-CM | POA: Diagnosis not present

## 2021-02-20 DIAGNOSIS — R7301 Impaired fasting glucose: Secondary | ICD-10-CM | POA: Diagnosis not present

## 2021-02-20 DIAGNOSIS — D6869 Other thrombophilia: Secondary | ICD-10-CM | POA: Diagnosis not present

## 2021-02-20 DIAGNOSIS — E441 Mild protein-calorie malnutrition: Secondary | ICD-10-CM | POA: Diagnosis not present

## 2021-02-20 DIAGNOSIS — E785 Hyperlipidemia, unspecified: Secondary | ICD-10-CM | POA: Diagnosis not present

## 2021-02-20 DIAGNOSIS — R600 Localized edema: Secondary | ICD-10-CM | POA: Diagnosis not present

## 2021-02-20 DIAGNOSIS — M810 Age-related osteoporosis without current pathological fracture: Secondary | ICD-10-CM | POA: Diagnosis not present

## 2021-02-20 DIAGNOSIS — F0390 Unspecified dementia without behavioral disturbance: Secondary | ICD-10-CM | POA: Diagnosis not present

## 2021-02-25 DIAGNOSIS — Z20828 Contact with and (suspected) exposure to other viral communicable diseases: Secondary | ICD-10-CM | POA: Diagnosis not present

## 2021-02-25 DIAGNOSIS — Z1159 Encounter for screening for other viral diseases: Secondary | ICD-10-CM | POA: Diagnosis not present

## 2021-03-04 DIAGNOSIS — Z20828 Contact with and (suspected) exposure to other viral communicable diseases: Secondary | ICD-10-CM | POA: Diagnosis not present

## 2021-03-04 DIAGNOSIS — Z1159 Encounter for screening for other viral diseases: Secondary | ICD-10-CM | POA: Diagnosis not present

## 2021-03-09 ENCOUNTER — Other Ambulatory Visit: Payer: Self-pay

## 2021-03-09 ENCOUNTER — Encounter (HOSPITAL_COMMUNITY): Payer: Self-pay

## 2021-03-09 ENCOUNTER — Emergency Department (HOSPITAL_COMMUNITY): Payer: Medicare PPO

## 2021-03-09 ENCOUNTER — Emergency Department (HOSPITAL_COMMUNITY)
Admission: EM | Admit: 2021-03-09 | Discharge: 2021-03-09 | Disposition: A | Discharge status: Home / Self Care | Payer: Medicare PPO | Attending: Emergency Medicine | Admitting: Emergency Medicine

## 2021-03-09 DIAGNOSIS — I119 Hypertensive heart disease without heart failure: Secondary | ICD-10-CM | POA: Insufficient documentation

## 2021-03-09 DIAGNOSIS — Z79899 Other long term (current) drug therapy: Secondary | ICD-10-CM | POA: Diagnosis not present

## 2021-03-09 DIAGNOSIS — M2550 Pain in unspecified joint: Secondary | ICD-10-CM | POA: Diagnosis not present

## 2021-03-09 DIAGNOSIS — M545 Low back pain, unspecified: Secondary | ICD-10-CM | POA: Diagnosis not present

## 2021-03-09 DIAGNOSIS — Z7401 Bed confinement status: Secondary | ICD-10-CM | POA: Diagnosis not present

## 2021-03-09 DIAGNOSIS — I10 Essential (primary) hypertension: Secondary | ICD-10-CM | POA: Diagnosis not present

## 2021-03-09 DIAGNOSIS — W19XXXA Unspecified fall, initial encounter: Secondary | ICD-10-CM

## 2021-03-09 DIAGNOSIS — F0390 Unspecified dementia without behavioral disturbance: Secondary | ICD-10-CM | POA: Insufficient documentation

## 2021-03-09 DIAGNOSIS — W010XXA Fall on same level from slipping, tripping and stumbling without subsequent striking against object, initial encounter: Secondary | ICD-10-CM | POA: Insufficient documentation

## 2021-03-09 DIAGNOSIS — M549 Dorsalgia, unspecified: Secondary | ICD-10-CM | POA: Diagnosis not present

## 2021-03-09 NOTE — Discharge Instructions (Addendum)
No clear injury was found from the fall

## 2021-03-09 NOTE — ED Triage Notes (Signed)
EMS reports from [facility], satff stated Pt slid down wall to floor landing on her bottom, Pt returned to standing on her own. No LOC, blood thinners, head strike, or obvious injuries. Pt c/o lower back pain.  BP 124/66 HR 81 RR 18 Sp02 99 RA Temp 97.7

## 2021-03-09 NOTE — ED Provider Notes (Signed)
[facility]-EMERGENCY DEPT Provider Note   CSN: [id number] Arrival date & time: 03/09/21  1141     History Chief Complaint  Patient presents with  . Fall  . Back Pain    Madison Maxwell is a 87 y.o. female.  HPI Level 5 caveat due to dementia.  Comes from nursing home.  Baseline history of dementia.  Reportedly has a mechanical fall.  Reportedly was sitting against a wall and slid down onto her buttocks.  Stood up on her own and was reportedly then complaining of back pain.  Right now patient does not remember what happened and can really not tell me the events.    Past Medical History:  Diagnosis Date  . Allergic rhinitis   . Anxiety   . Duodenal ulcer   . External hemorrhoid   . Hyperlipidemia   . Memory deficit   . Osteoarthritis   . Osteoporosis   . PAF (paroxysmal atrial fibrillation) (HCC)    a. diagnosed in 05/2017. Started on Xarelto for anticoagulation.   . SUI (stress urinary incontinence), female   . Vitamin D deficiency     Patient Active Problem List   Diagnosis Date Noted  . Bilateral impacted cerumen 11/12/2020  . Sensorineural hearing loss (SNHL), bilateral 11/12/2020  . Intracranial bleeding (HCC) 10/05/2017  . Fall from slip, trip, or stumble, initial encounter 10/05/2017  . Contusion of face 10/05/2017  . Hypertensive heart disease without CHF 06/07/2017  . Current use of long term anticoagulation 06/07/2017  . Leukopenia 06/06/2017  . Thrombocytopenia (HCC) 06/06/2017  . Paroxysmal atrial fibrillation (HCC) 06/06/2017  . History of CVA (cerebrovascular accident) 06/06/2017  . Gait disorder 12/23/2016  . Dyslipidemia 06/17/2016  . Ataxia   . Memory deficit     Past Surgical History:  Procedure Laterality Date  . APPENDECTOMY    . VAGINAL HYSTERECTOMY       OB History   No obstetric history on file.     Family History  Problem Relation Age of Onset  . Colon polyps Sister   . Colon cancer Neg Hx     Social  History   Tobacco Use  . Smoking status: Never Smoker  . Smokeless tobacco: Never Used  Vaping Use  . Vaping Use: Never used  Substance Use Topics  . Alcohol use: No  . Drug use: No    Home Medications Prior to Admission medications   Medication Sig Start Date End Date Taking? Authorizing Provider  cholecalciferol (VITAMIN D) 1000 units tablet Take 1,000 Units daily with breakfast by mouth.     [provider]  CREON 36000 units CPEP capsule Take 8 capsules by mouth See admin instructions. Take 2 capsules with meals, and 1 capsule with snacks every day for a total of 8 capsules per day. 11/09/19   [provider]  galantamine (RAZADYNE ER) 16 MG 24 hr capsule Take 16 mg by mouth daily with breakfast.  10/29/18   [provider]  metoprolol tartrate (LOPRESSOR) 25 MG tablet Take 12.5 mg by mouth daily.     [provider]  Multiple Vitamins-Iron (MULTIVITAMINS WITH IRON) TABS tablet Take by mouth.    [provider]  Multiple Vitamins-Minerals (MULTIVITAMINS THER. W/MINERALS) TABS Take 1 tablet every evening by mouth.     [provider]  omega-3 fish oil (MAXEPA) 1000 MG CAPS capsule Take 1 capsule daily by mouth.     [provider]  Probiotic Product (PROBIOTIC DAILY) CAPS Take 1  capsule daily at 6 PM by mouth.    [provider]  triamcinolone ointment (KENALOG) 0.1 % Apply topically at bedtime. 09/04/20   [provider]  vitamin B-12 (CYANOCOBALAMIN) 1000 MCG tablet Take 1,000 mcg by mouth daily.    [provider]  vitamin C (ASCORBIC ACID) 500 MG tablet Take 500 mg by mouth daily.    [provider]    Allergies    Patient has no known allergies.  Review of Systems   Review of Systems  Unable to perform ROS: Dementia    Physical Exam Updated Vital Signs BP (!) 174/72   Pulse 72   Temp 98.2 F (36.8 C) (Oral)   Resp 16   SpO2 100%   Physical Exam Vitals and nursing note  reviewed.  HENT:     Head: Atraumatic.     Mouth/Throat:     Mouth: Mucous membranes are moist.  Cardiovascular:     Rate and Rhythm: Regular rhythm.  Pulmonary:     Breath sounds: No wheezing or rhonchi.  Abdominal:     Tenderness: There is no abdominal tenderness.  Musculoskeletal:        General: No tenderness.     Cervical back: Neck supple.     Comments: No tenderness over upper lower extremities.  No lumbar tenderness.  Skin:    General: Skin is warm.     Capillary Refill: Capillary refill takes less than 2 seconds.  Neurological:     Mental Status: She is alert.     Comments: Awake and pleasant, but confused.  Moving all extremities.     ED Results / Procedures / Treatments   Labs (all labs ordered are listed, but only abnormal results are displayed) Labs Reviewed - No data to display  EKG None  Radiology DG Lumbar Spine Complete  Result Date: 03/09/2021 CLINICAL DATA:  Status post fall.  Lower back pain. EXAM: LUMBAR SPINE - COMPLETE 4+ VIEW COMPARISON:  None. FINDINGS: Limited evaluation due to rotatory scoliosis. No apparent acute vertebral body fracture. IMPRESSION: 1. Limited evaluation due to rotatory scoliosis. 2. No apparent acute vertebral body fracture. Electronically Signed   By: Ted Mcalpine M.D.   On: 03/09/2021 13:06    Procedures Procedures   Medications Ordered in ED Medications - No data to display  ED Course  I have reviewed the triage vital signs and the nursing notes.  Pertinent labs & imaging results that were available during my care of the patient were reviewed by me and considered in my medical decision making (see chart for details).    MDM Rules/Calculators/A&P                          Patient with witnessed fall.  Reportedly slid down the wall and sat on her rear end.  Had been complaining of low back pain although no tenderness.  With dementia x-ray done reassuring.  Will discharge back to home. Final Clinical  Impression(s) / ED Diagnoses Final diagnoses:  Fall, initial encounter    Rx / DC Orders ED Discharge Orders    None       Benjiman Core, MD 03/09/21 1434

## 2021-03-11 DIAGNOSIS — Z1159 Encounter for screening for other viral diseases: Secondary | ICD-10-CM | POA: Diagnosis not present

## 2021-03-11 DIAGNOSIS — Z20828 Contact with and (suspected) exposure to other viral communicable diseases: Secondary | ICD-10-CM | POA: Diagnosis not present

## 2021-03-18 DIAGNOSIS — Z1159 Encounter for screening for other viral diseases: Secondary | ICD-10-CM | POA: Diagnosis not present

## 2021-03-18 DIAGNOSIS — Z20828 Contact with and (suspected) exposure to other viral communicable diseases: Secondary | ICD-10-CM | POA: Diagnosis not present

## 2021-03-25 DIAGNOSIS — Z1159 Encounter for screening for other viral diseases: Secondary | ICD-10-CM | POA: Diagnosis not present

## 2021-03-25 DIAGNOSIS — Z20828 Contact with and (suspected) exposure to other viral communicable diseases: Secondary | ICD-10-CM | POA: Diagnosis not present

## 2021-04-01 DIAGNOSIS — Z1159 Encounter for screening for other viral diseases: Secondary | ICD-10-CM | POA: Diagnosis not present

## 2021-04-01 DIAGNOSIS — Z20828 Contact with and (suspected) exposure to other viral communicable diseases: Secondary | ICD-10-CM | POA: Diagnosis not present

## 2021-04-08 DIAGNOSIS — Z1159 Encounter for screening for other viral diseases: Secondary | ICD-10-CM | POA: Diagnosis not present

## 2021-04-08 DIAGNOSIS — Z20828 Contact with and (suspected) exposure to other viral communicable diseases: Secondary | ICD-10-CM | POA: Diagnosis not present

## 2021-04-12 ENCOUNTER — Other Ambulatory Visit: Payer: Self-pay

## 2021-04-12 ENCOUNTER — Other Ambulatory Visit: Payer: Self-pay | Admitting: Hospice

## 2021-04-12 DIAGNOSIS — R413 Other amnesia: Secondary | ICD-10-CM | POA: Diagnosis not present

## 2021-04-12 DIAGNOSIS — G8929 Other chronic pain: Secondary | ICD-10-CM | POA: Diagnosis not present

## 2021-04-12 DIAGNOSIS — M545 Low back pain, unspecified: Secondary | ICD-10-CM

## 2021-04-12 DIAGNOSIS — Z515 Encounter for palliative care: Secondary | ICD-10-CM | POA: Diagnosis not present

## 2021-04-12 DIAGNOSIS — R269 Unspecified abnormalities of gait and mobility: Secondary | ICD-10-CM

## 2021-04-12 NOTE — Progress Notes (Signed)
Therapist, nutritional Palliative Care Consult Note Telephone: [phone]  Fax: [phone]  PATIENT NAME: Madison Maxwell DOB: 01-Oct-1920 MRN: [id number]  PRIMARY CARE PROVIDER:   Rodrigo Ran, MD Rodrigo Ran, MD [address] [place],  [place] [address]  REFERRING PROVIDER: Rodrigo Ran, MD Rodrigo Ran, MD [address] [place],  [place] [address]  RESPONSIBLE PARTY:Extended Emergency Contact Information Primary Emergency Contact: [facility] Address: [address] [place], [place] [address] Darden [place] of [place] Home Phone: [phone] Mobile Phone: [phone] Relation: Son Secondary Emergency Contact: [facility] Address: [address] [place], [place] [address] Darden [place] of [place] Home Phone: [phone] Relation: Daughter Sarea Fyfe, Bruce's wife [phone]. Couple lives in [place]; call [place] during or after visit   Contact Information    Name Relation Home Work Mobile   [place] Son [phone]  [phone]   [facility] Daughter [phone]  [phone]      Visit is to build trust and highlight Palliative Medicine as specialized medical care for people living with serious illness, aimed at facilitating better quality of life through symptoms relief, assisting with advance care planning and complex medical decision making.  NP called Bruce and left him a voicemail with callback number  RECOMMENDATIONS/PLAN:   Advance Care Planning/Code Status: Patient is a DO NOT RESUSCITATE  Goals of Care: Goals of care include to maximize quality of life and symptom management.  MOST selections include limited additional intervention, IV fluids as indicated antibiotics as indicated, feeding tube for defined trial.  Visit consisted of counseling and education dealing with the complex and emotionally intense issues of symptom management and palliative care in the setting of serious and  potentially life-threatening illness. Palliative care team will continue to support patient, patient's family, and medical team.  Symptom management/Plan:  Gait disturbance: Worsening, fall precautions discussed.  Use of rolling walker for support and to help prevent fall.  Patient and spouse said physical therapy none needed at this time.  Routine CBC BMP. Memory loss: At baseline, related to dementia.  Use cueing as appropriate.  Encourage reminiscence, simple words search/puzzles.  Continue galantamine.  Low back pain: New onset following fall last month.  She denied low back pain during the visit.  Tylenol as 500 mg 3 times daily as needed for low back pain.  Follow up: Palliative care will continue to follow for complex medical decision making, advance care planning, and clarification of goals. Return 6 weeks or prn. Encouraged to call provider sooner with any concerns.  CHIEF COMPLAINT: Palliative follow up visit/gait disturbance  HISTORY OF PRESENT ILLNESS:  Madison Maxwell a 87 y.o. female with multiple medical problems including gait disturbance which is acute on chonic, recently worsening in the past month, resulting in a fall 03/09/2021 for which she was seen in the ED briefly; no injuries, complaint of low back pain.  Gait imbalance impairs mobility,  worse in the morning, improves as the day goes on.  In the ED, DG lumbar spine was negative for fracture.  PPS currently at 50%, down from 60% in March 2022.  Patient denies dizziness, pain/discomfort.  History of hypertensive heart disease, A. fib previous stroke.  History obtained from review of EMR, discussion with primary team, family, caregiver  and/or patient. Records reviewed and summarized above. All 10 point systems reviewed and are negative except as documented in history of present illness above  Review and summarization of Epic records shows history from other than patient.   Palliative Care was asked to  follow this patient by  consultation request of Rodrigo Ran, MD to help address complex decision making in the context of advance care planning and goals of care clarification.   PPS: 50% down from 60% last visit ROS  General: NAD, appropriately dressed Constitution: Denies fever/chills EYES: denies vision changes ENMT: denies Xerostomia,  dysphagia Cardiovascular: denies chest pain Pulmonary: denies  cough, denies dyspnea  Abdomen: endorses fair appetite, denies constipation or diarrhea GU: denies dysuria MSK:  endorses ROM limitations Skin: denies rashes/bruising Neurological: Denies weakness, denies pain, denies insomnia Psych: Endorses positive mood Heme/lymph/immuno: denies bruises, no abnormal bleeding   PHYSICAL EXAM BP  BP 116/66 P81 R18 02 97% RA General: In no acute distress, appropriately dressed Cardiovascular: regular rate and rhythm; no edema in BLE Pulmonary: no cough, no increased work of breathing, normal respiratory effort Abdomen: soft, non tender, no guarding, positive bowel sounds in all quadrants GU:  no suprapubic tenderness Eyes: Normal lids, no discharge, sclera anicteric ENMT: Moist mucous membranes Musculoskeletal:  weakness, sarcopenia, unsteady gait Skin: no rash to visible skin, warm without cyanosis,  Psych: non-anxious affect Neurological: Weakness but otherwise non focal Heme/lymph/immuno: no bruises, no bleeding  PERTINENT MEDICATIONS:  Outpatient Encounter Medications as of 04/12/2021  Medication Sig  . cholecalciferol (VITAMIN D) 1000 units tablet Take 1,000 Units daily with breakfast by mouth.   . CREON 36000 units CPEP capsule Take 8 capsules by mouth See admin instructions. Take 2 capsules with meals, and 1 capsule with snacks every day for a total of 8 capsules per day.  . galantamine (RAZADYNE ER) 16 MG 24 hr capsule Take 16 mg by mouth daily with breakfast.   . metoprolol tartrate (LOPRESSOR) 25 MG tablet Take 12.5 mg by mouth daily.   . Multiple  Vitamins-Iron (MULTIVITAMINS WITH IRON) TABS tablet Take by mouth.  . Multiple Vitamins-Minerals (MULTIVITAMINS THER. W/MINERALS) TABS Take 1 tablet every evening by mouth.   . omega-3 fish oil (MAXEPA) 1000 MG CAPS capsule Take 1 capsule daily by mouth.   . Probiotic Product (PROBIOTIC DAILY) CAPS Take 1 capsule daily at 6 PM by mouth.  . triamcinolone ointment (KENALOG) 0.1 % Apply topically at bedtime.  . vitamin B-12 (CYANOCOBALAMIN) 1000 MCG tablet Take 1,000 mcg by mouth daily.  . vitamin C (ASCORBIC ACID) 500 MG tablet Take 500 mg by mouth daily.   No facility-administered encounter medications on file as of 04/12/2021.    HOSPICE ELIGIBILITY/DIAGNOSIS: TBD  PAST MEDICAL HISTORY:  Past Medical History:  Diagnosis Date  . Allergic rhinitis   . Anxiety   . Duodenal ulcer   . External hemorrhoid   . Hyperlipidemia   . Memory deficit   . Osteoarthritis   . Osteoporosis   . PAF (paroxysmal atrial fibrillation) (HCC)    a. diagnosed in 05/2017. Started on Xarelto for anticoagulation.   . SUI (stress urinary incontinence), female   . Vitamin D deficiency      SOCIAL HX: @SOCX  Patient lives at home with spouse for ongoing care  FAMILY HX:  Family History  Problem Relation Age of Onset  . Colon polyps Sister   . Colon cancer Neg Hx     Review lab tests/diagnostics Results for SABRYN, PRESLAR (MRN Cheron Every) as of 04/12/2021 15:54  Ref. Range 03/09/2021 12:33  DG LUMBAR SPINE COMPLETE 4 +V Unknown Rpt  Result indicates No apparent acute vertebral body fracture.  ALLERGIES: No Known Allergies    Thank you for the opportunity to participate in the care of  Oreta C Bolding Please call our office at [phone] if we can be of additional assistance.  Note: Portions of this note were generated with Scientist, clinical (histocompatibility and immunogenetics). Dictation errors may occur despite best attempts at proofreading.  Rosaura Carpenter, NP

## 2021-04-22 DIAGNOSIS — Z20828 Contact with and (suspected) exposure to other viral communicable diseases: Secondary | ICD-10-CM | POA: Diagnosis not present

## 2021-04-22 DIAGNOSIS — Z1159 Encounter for screening for other viral diseases: Secondary | ICD-10-CM | POA: Diagnosis not present

## 2021-04-29 DIAGNOSIS — Z20828 Contact with and (suspected) exposure to other viral communicable diseases: Secondary | ICD-10-CM | POA: Diagnosis not present

## 2021-04-29 DIAGNOSIS — Z1159 Encounter for screening for other viral diseases: Secondary | ICD-10-CM | POA: Diagnosis not present

## 2021-05-06 DIAGNOSIS — Z20828 Contact with and (suspected) exposure to other viral communicable diseases: Secondary | ICD-10-CM | POA: Diagnosis not present

## 2021-05-06 DIAGNOSIS — Z1159 Encounter for screening for other viral diseases: Secondary | ICD-10-CM | POA: Diagnosis not present

## 2021-05-10 ENCOUNTER — Other Ambulatory Visit: Payer: Self-pay

## 2021-05-10 ENCOUNTER — Ambulatory Visit (INDEPENDENT_AMBULATORY_CARE_PROVIDER_SITE_OTHER): Payer: Medicare PPO | Admitting: Podiatry

## 2021-05-10 DIAGNOSIS — S90222A Contusion of left lesser toe(s) with damage to nail, initial encounter: Secondary | ICD-10-CM

## 2021-05-10 DIAGNOSIS — M79675 Pain in left toe(s): Secondary | ICD-10-CM

## 2021-05-10 DIAGNOSIS — M79674 Pain in right toe(s): Secondary | ICD-10-CM

## 2021-05-10 DIAGNOSIS — B351 Tinea unguium: Secondary | ICD-10-CM

## 2021-05-13 DIAGNOSIS — Z1159 Encounter for screening for other viral diseases: Secondary | ICD-10-CM | POA: Diagnosis not present

## 2021-05-13 DIAGNOSIS — Z20828 Contact with and (suspected) exposure to other viral communicable diseases: Secondary | ICD-10-CM | POA: Diagnosis not present

## 2021-05-15 ENCOUNTER — Encounter: Payer: Self-pay | Admitting: Podiatry

## 2021-05-15 NOTE — Progress Notes (Signed)
Subjective: Madison Maxwell is a pleasant 87 y.o. female patient seen today painful thick toenails that are difficult to trim. Pain interferes with ambulation. Aggravating factors include wearing enclosed shoe gear. Pain is relieved with periodic professional debridement.  Mrs. Zartman is a resident of [facility].  PCP is Rodrigo Ran, MD. Last visit was: 11/02/2020.  No Known Allergies  Objective: Physical Exam  General: Madison Maxwell is a pleasant 87 y.o. Caucasian female, frail, in NAD. AAO x 3.   Vascular:  Capillary refill time to digits immediate b/l. Palpable pedal pulses b/l LE. Pedal hair present. Lower extremity skin temperature gradient within normal limits.  Dermatological:  Pedal skin with normal turgor, texture and tone bilaterally. No open wounds bilaterally. No interdigital macerations bilaterally. Toenails 2-5 bilaterally and R hallux well maintained with adequate length. No erythema, no edema, no drainage, no fluctuance. There is noted subungual seroma with <1/4 cc of clear serous drainage with onycholysis of distal 1/3 of nailplate left hallux. No erythema, no edema.  Musculoskeletal:  Normal muscle strength 5/5 to all lower extremity muscle groups bilaterally. No pain crepitus or joint limitation noted with ROM b/l. Hallux valgus with bunion deformity noted b/l lower extremities.  Neurological:  Protective sensation intact 5/5 intact bilaterally with 10g monofilament b/l. Vibratory sensation intact b/l.  Assessment and Plan:  1. Pain due to onychomycosis of toenails of both feet   2. Subungual contusion of toenail of left foot, initial encounter     -Examined patient. -Subungual seroma evacuated left  hallux. Light bleeding addressed with Lumicain Hemostatic Solution. Betadine Ointment and light dressing applied. Orders written on Rx pad for facility staff to apply betadine ointment to left great toe once daily for one week. -Patient to continue soft,  supportive shoe gear daily. -Toenails 2-5 bilaterally and R hallux debrided in length and girth without iatrogenic bleeding with sterile nail nipper and dremel.  -Patient to report any pedal injuries to medical professional immediately. -Patient/POA to call should there be question/concern in the interim.  Return in about 3 months (around 08/10/2021).  Freddie Breech, DPM

## 2021-05-20 DIAGNOSIS — Z1159 Encounter for screening for other viral diseases: Secondary | ICD-10-CM | POA: Diagnosis not present

## 2021-05-20 DIAGNOSIS — Z20828 Contact with and (suspected) exposure to other viral communicable diseases: Secondary | ICD-10-CM | POA: Diagnosis not present

## 2021-05-27 DIAGNOSIS — Z1159 Encounter for screening for other viral diseases: Secondary | ICD-10-CM | POA: Diagnosis not present

## 2021-05-27 DIAGNOSIS — Z20828 Contact with and (suspected) exposure to other viral communicable diseases: Secondary | ICD-10-CM | POA: Diagnosis not present

## 2021-05-29 ENCOUNTER — Other Ambulatory Visit (HOSPITAL_COMMUNITY): Payer: Self-pay | Admitting: *Deleted

## 2021-05-30 ENCOUNTER — Ambulatory Visit (HOSPITAL_COMMUNITY)
Admission: RE | Admit: 2021-05-30 | Discharge: 2021-05-30 | Disposition: A | Discharge status: Home / Self Care | Payer: Medicare PPO | Source: Ambulatory Visit | Attending: Internal Medicine | Admitting: Internal Medicine

## 2021-05-30 ENCOUNTER — Other Ambulatory Visit: Payer: Self-pay

## 2021-05-30 DIAGNOSIS — M810 Age-related osteoporosis without current pathological fracture: Secondary | ICD-10-CM | POA: Diagnosis not present

## 2021-05-30 IMAGING — CR DG LUMBAR SPINE COMPLETE 4+V
5 series · 5 of 5 positions shown · non-contrast
Comparison: None.

CLINICAL DATA: Status post fall.  Lower back pain.

EXAM:
LUMBAR SPINE - COMPLETE 4+ VIEW

[t lumbar spine ap]
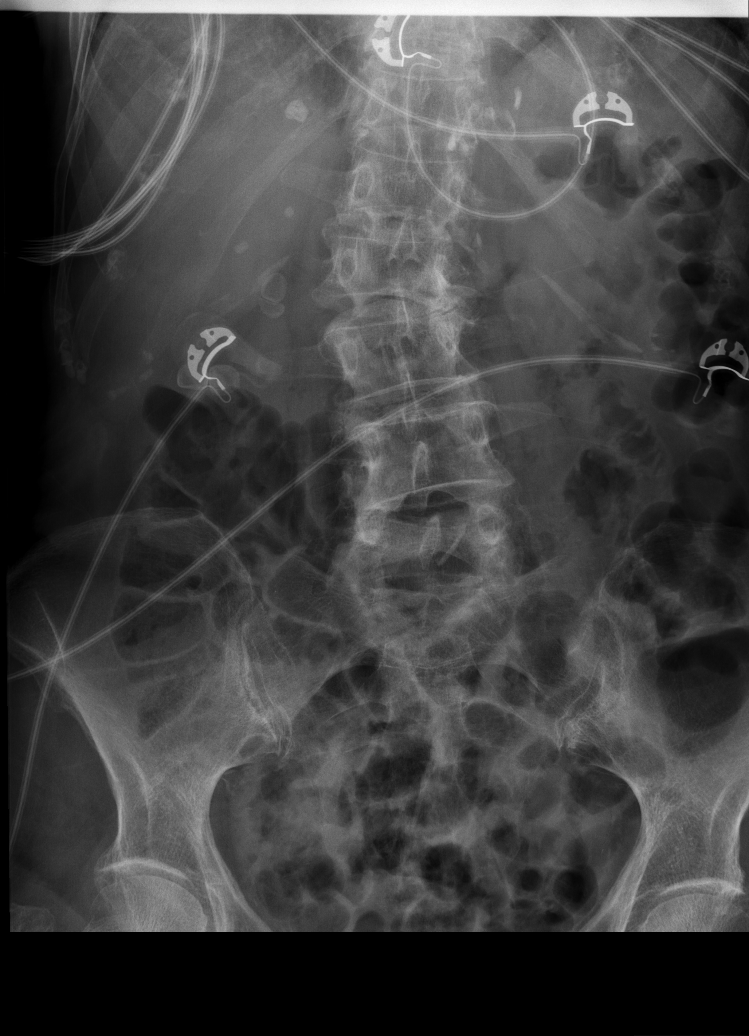

[t lumbar spine obl (1 of 2)]
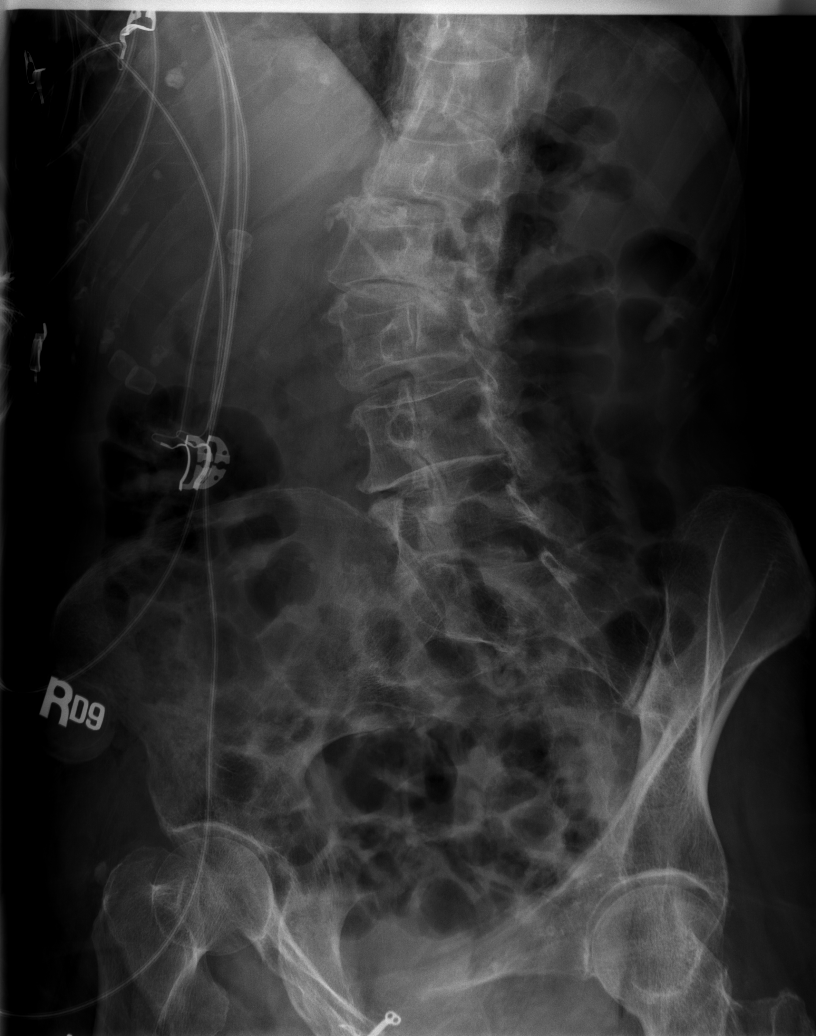

[t lumbar spine obl (2 of 2)]
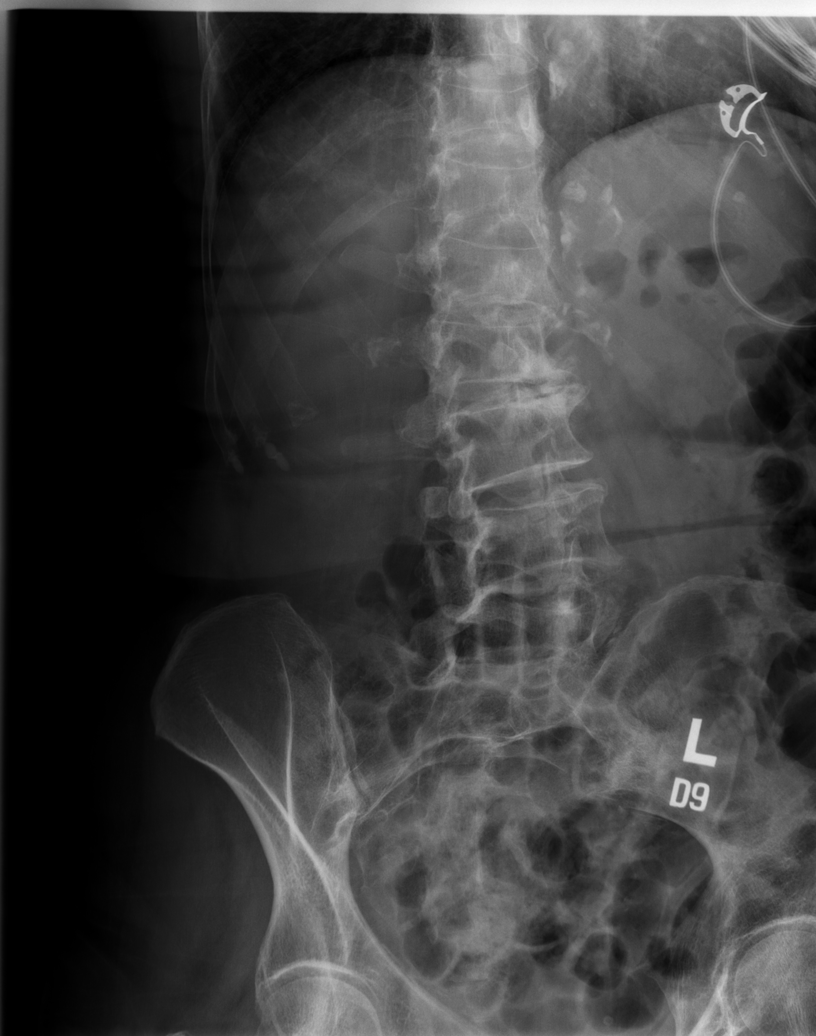

[t lumbar spine lat]
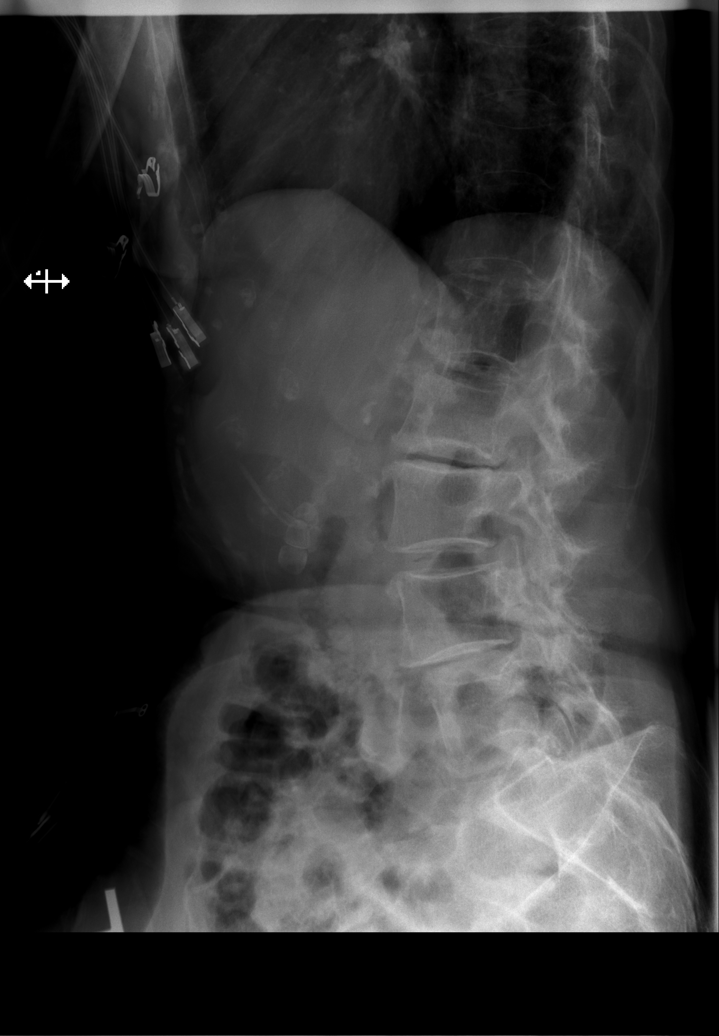

[t lumbar l-5 s-1 spot]
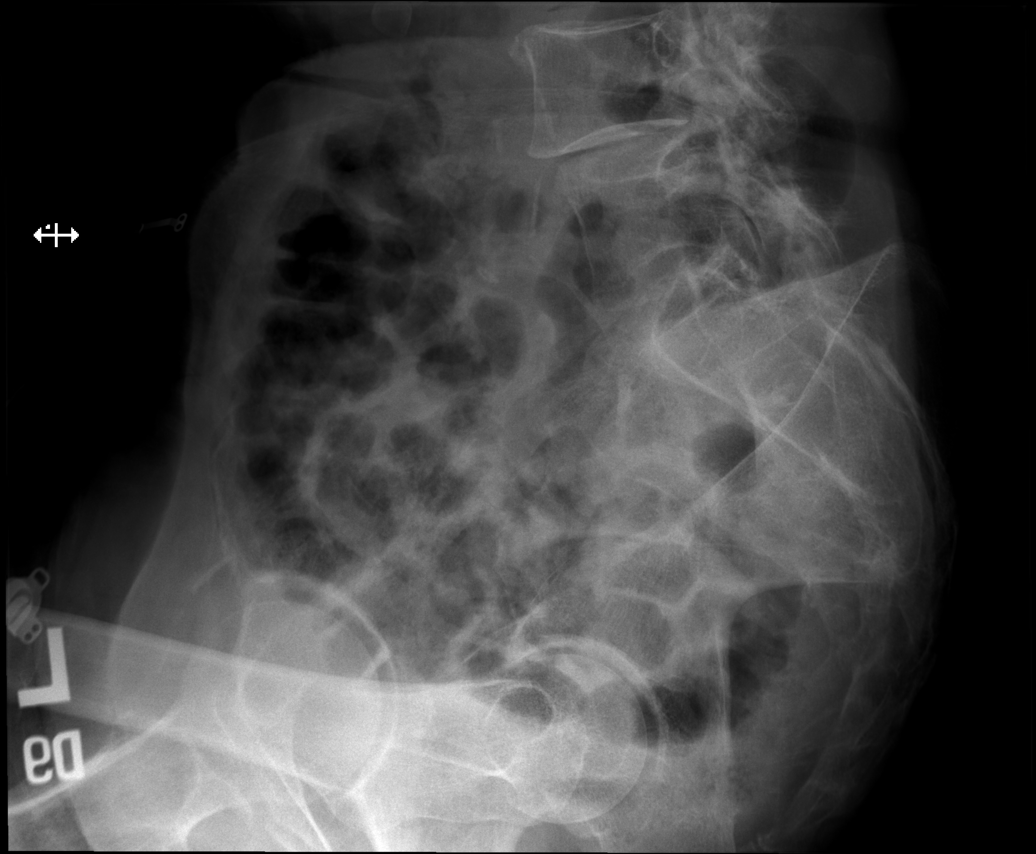

[5 of 5 positions shown; findings below may reference images not displayed]

FINDINGS: Limited evaluation due to rotatory scoliosis. No apparent acute
vertebral body fracture.
IMPRESSION: 1. Limited evaluation due to rotatory scoliosis.
2. No apparent acute vertebral body fracture.

## 2021-05-30 MED ORDER — DENOSUMAB 60 MG/ML ~~LOC~~ SOSY
PREFILLED_SYRINGE | SUBCUTANEOUS | Status: AC
  Administered 2021-05-30: 14:00:00 60 mg via SUBCUTANEOUS
  Filled 2021-05-30: qty 1

## 2021-05-30 MED ORDER — DENOSUMAB 60 MG/ML ~~LOC~~ SOSY: 60.0000 mg | PREFILLED_SYRINGE | Freq: Once | SUBCUTANEOUS | Status: AC

## 2021-06-03 DIAGNOSIS — Z1159 Encounter for screening for other viral diseases: Secondary | ICD-10-CM | POA: Diagnosis not present

## 2021-06-03 DIAGNOSIS — Z20828 Contact with and (suspected) exposure to other viral communicable diseases: Secondary | ICD-10-CM | POA: Diagnosis not present

## 2021-06-07 ENCOUNTER — Other Ambulatory Visit: Payer: Self-pay | Admitting: Hospice

## 2021-06-07 ENCOUNTER — Other Ambulatory Visit: Payer: Self-pay

## 2021-06-07 DIAGNOSIS — F0390 Unspecified dementia without behavioral disturbance: Secondary | ICD-10-CM

## 2021-06-07 DIAGNOSIS — R413 Other amnesia: Secondary | ICD-10-CM

## 2021-06-07 DIAGNOSIS — R269 Unspecified abnormalities of gait and mobility: Secondary | ICD-10-CM | POA: Diagnosis not present

## 2021-06-07 DIAGNOSIS — Z515 Encounter for palliative care: Secondary | ICD-10-CM | POA: Diagnosis not present

## 2021-06-07 NOTE — Progress Notes (Signed)
Therapist, nutritional Palliative Care Consult Note Telephone: [phone]  Fax: [phone]  PATIENT NAME: Madison Maxwell DOB: 03-23-1920 MRN: [id number]  PRIMARY CARE PROVIDER:   Rodrigo Ran, MD Rodrigo Ran, MD [address] [place],  [place] [address]  REFERRING PROVIDER: Rodrigo Ran, MD Rodrigo Ran, MD [address] [place],  [place] [address]  RESPONSIBLE PARTY:   Extended Emergency Contact Information Primary Emergency Contact: [facility] Address: [address]          [place], [place] [address] Madison Maxwell of [place] Home Phone: [phone] Mobile Phone: [phone] Relation: Son Secondary Emergency Contact: [facility] Address: [address] [place], [place] [address] Madison Maxwell of [place] Home Phone: [phone] Relation: Daughter Madison Maxwell, Bruce's wife [phone]. Couple lives in [place]; call [place] during or after visit Contact Information     Name Relation Home Work Mobile   [place] Son [phone]  [phone]   [employer] Daughter [phone]  [phone]       Visit is to build trust and highlight Palliative Medicine as specialized medical care for people living with serious illness, aimed at facilitating better quality of life through symptoms relief, assisting with advance care planning and complex medical decision making. This is a follow up visit.  RECOMMENDATIONS/PLAN:  Advance Care Planning/Code Status: Patient is a DO NOT RESUSCITATE   Goals of Care: Goals of care include to maximize quality of life and symptom management.  MOST selections include limited additional intervention, IV fluids as indicated antibiotics as indicated, feeding tube for defined trial.  Visit consisted of counseling and education dealing with the complex and emotionally intense issues of symptom management and palliative care in the setting of serious and potentially life-threatening illness.  Palliative care team will continue to support patient, patient's family, and medical team.  Symptom management/Plan:  Gait disturbance: No fall since last visit; patient is compliant with Use of rolling walker for support and to help prevent fall.   Memory loss: At baseline, related to dementia.  Use cueing as appropriate.  Encourage reminiscence, simple words search/puzzles.  Continue galantamine. FAST 6C FLACC 0 Low back pain: Managed with heating pad,  Tylenol as 500 mg 3 times daily as needed for low back pain. Edema: Compression hose and elevation discussed. Elevation demonstrated. Reduced salt intake.  Follow up: Palliative care will continue to follow for complex medical decision making, advance care planning, and clarification of goals. Return 6 weeks or prn. Encouraged to call provider sooner with any concerns.  CHIEF COMPLAINT: Palliative follow up  HISTORY OF PRESENT ILLNESS:  Ashe C Madison Maxwell a 87 y.o. female with multiple medical problems including Gait imbalance, low back pain, hypertensive heart disease, A. Fib, previous stroke.  Patient denies pain/discomfort. Spouse with no concerns. History obtained from review of EMR, discussion with primary team, family and/or patient. Records reviewed and summarized above. All 10 point systems reviewed and are negative except as documented in history of present illness above  Review and summarization of Epic records shows history from other than patient.   Palliative Care was asked to follow this patient o help address complex decision making in the context of advance care planning and goals of care clarification.   PHYSICAL EXAM BP  BP 128/70 P81 R18 02 97% RA General: In no acute distress, appropriately dressed Cardiovascular: regular rate and rhythm; trace non pitting BLE edema encouraged and demonstrated elevation Pulmonary: no cough, no increased work of breathing, normal  respiratory effort Abdomen: soft, non tender, no guarding,  positive bowel sounds in all quadrants GU:  no suprapubic tenderness Eyes: Normal lids, no discharge, sclera anicteric ENMT: Moist mucous membranes Musculoskeletal: sarcopenia, unsteady gait, uses rolling walker for support during ambulation Skin: no rash to visible skin, warm without cyanosis, Psych: non-anxious affect Neurological: Weakness but otherwise non focal Heme/lymph/immuno: no bruises, no bleeding  PERTINENT MEDICATIONS:  Outpatient Encounter Medications as of 06/07/2021  Medication Sig   cholecalciferol (VITAMIN D) 1000 units tablet Take 1,000 Units daily with breakfast by mouth.    CREON 36000 units CPEP capsule Take 8 capsules by mouth See admin instructions. Take 2 capsules with meals, and 1 capsule with snacks every day for a total of 8 capsules per day.   galantamine (RAZADYNE ER) 16 MG 24 hr capsule Take 16 mg by mouth daily with breakfast.    metoprolol tartrate (LOPRESSOR) 25 MG tablet Take 12.5 mg by mouth daily.    Multiple Vitamins-Iron (MULTIVITAMINS WITH IRON) TABS tablet Take by mouth.   Multiple Vitamins-Minerals (MULTIVITAMINS THER. W/MINERALS) TABS Take 1 tablet every evening by mouth.    omega-3 fish oil (MAXEPA) 1000 MG CAPS capsule Take 1 capsule daily by mouth.    Probiotic Product (PROBIOTIC DAILY) CAPS Take 1 capsule daily at 6 PM by mouth.   triamcinolone ointment (KENALOG) 0.1 % Apply topically at bedtime.   vitamin B-12 (CYANOCOBALAMIN) 1000 MCG tablet Take 1,000 mcg by mouth daily.   vitamin C (ASCORBIC ACID) 500 MG tablet Take 500 mg by mouth daily.   No facility-administered encounter medications on file as of 06/07/2021.    HOSPICE ELIGIBILITY/DIAGNOSIS: TBD  PAST MEDICAL HISTORY:  Past Medical History:  Diagnosis Date   Allergic rhinitis    Anxiety    Duodenal ulcer    External hemorrhoid    Hyperlipidemia    Memory deficit    Osteoarthritis    Osteoporosis    PAF (paroxysmal atrial fibrillation) (HCC)    a. diagnosed in 05/2017.  Started on Xarelto for anticoagulation.    SUI (stress urinary incontinence), female    Vitamin D deficiency     ALLERGIES: No Known Allergies    I spent  60 minutes providing this consultation; this includes time spent with patient/family, chart review and documentation. More than 50% of the time in this consultation was spent on counseling and coordinating communication   Thank you for the opportunity to participate in the care of Keltie C Bottcher Please call our office at [phone] if we can be of additional assistance.  Note: Portions of this note were generated with Scientist, clinical (histocompatibility and immunogenetics). Dictation errors may occur despite best attempts at proofreading.  Rosaura Carpenter, NP

## 2021-06-10 DIAGNOSIS — Z1159 Encounter for screening for other viral diseases: Secondary | ICD-10-CM | POA: Diagnosis not present

## 2021-06-10 DIAGNOSIS — Z20828 Contact with and (suspected) exposure to other viral communicable diseases: Secondary | ICD-10-CM | POA: Diagnosis not present

## 2021-06-17 DIAGNOSIS — Z20828 Contact with and (suspected) exposure to other viral communicable diseases: Secondary | ICD-10-CM | POA: Diagnosis not present

## 2021-06-17 DIAGNOSIS — Z1159 Encounter for screening for other viral diseases: Secondary | ICD-10-CM | POA: Diagnosis not present

## 2021-06-24 DIAGNOSIS — Z1159 Encounter for screening for other viral diseases: Secondary | ICD-10-CM | POA: Diagnosis not present

## 2021-06-24 DIAGNOSIS — Z20828 Contact with and (suspected) exposure to other viral communicable diseases: Secondary | ICD-10-CM | POA: Diagnosis not present

## 2021-07-01 DIAGNOSIS — D6869 Other thrombophilia: Secondary | ICD-10-CM | POA: Diagnosis not present

## 2021-07-01 DIAGNOSIS — E441 Mild protein-calorie malnutrition: Secondary | ICD-10-CM | POA: Diagnosis not present

## 2021-07-01 DIAGNOSIS — M810 Age-related osteoporosis without current pathological fracture: Secondary | ICD-10-CM | POA: Diagnosis not present

## 2021-07-01 DIAGNOSIS — R600 Localized edema: Secondary | ICD-10-CM | POA: Diagnosis not present

## 2021-07-01 DIAGNOSIS — E785 Hyperlipidemia, unspecified: Secondary | ICD-10-CM | POA: Diagnosis not present

## 2021-07-01 DIAGNOSIS — I6389 Other cerebral infarction: Secondary | ICD-10-CM | POA: Diagnosis not present

## 2021-07-01 DIAGNOSIS — Z1159 Encounter for screening for other viral diseases: Secondary | ICD-10-CM | POA: Diagnosis not present

## 2021-07-01 DIAGNOSIS — R7301 Impaired fasting glucose: Secondary | ICD-10-CM | POA: Diagnosis not present

## 2021-07-01 DIAGNOSIS — F0390 Unspecified dementia without behavioral disturbance: Secondary | ICD-10-CM | POA: Diagnosis not present

## 2021-07-01 DIAGNOSIS — I480 Paroxysmal atrial fibrillation: Secondary | ICD-10-CM | POA: Diagnosis not present

## 2021-07-01 DIAGNOSIS — Z20828 Contact with and (suspected) exposure to other viral communicable diseases: Secondary | ICD-10-CM | POA: Diagnosis not present

## 2021-07-08 DIAGNOSIS — Z8616 Personal history of COVID-19: Secondary | ICD-10-CM | POA: Diagnosis not present

## 2021-07-15 DIAGNOSIS — Z20828 Contact with and (suspected) exposure to other viral communicable diseases: Secondary | ICD-10-CM | POA: Diagnosis not present

## 2021-07-29 DIAGNOSIS — Z20828 Contact with and (suspected) exposure to other viral communicable diseases: Secondary | ICD-10-CM | POA: Diagnosis not present

## 2021-08-01 ENCOUNTER — Other Ambulatory Visit: Payer: Self-pay

## 2021-08-01 ENCOUNTER — Other Ambulatory Visit: Payer: Self-pay | Admitting: Hospice

## 2021-08-01 DIAGNOSIS — E43 Unspecified severe protein-calorie malnutrition: Secondary | ICD-10-CM

## 2021-08-01 DIAGNOSIS — F0390 Unspecified dementia without behavioral disturbance: Secondary | ICD-10-CM | POA: Diagnosis not present

## 2021-08-01 DIAGNOSIS — R269 Unspecified abnormalities of gait and mobility: Secondary | ICD-10-CM | POA: Diagnosis not present

## 2021-08-01 DIAGNOSIS — Z515 Encounter for palliative care: Secondary | ICD-10-CM

## 2021-08-01 NOTE — Progress Notes (Signed)
Therapist, nutritional Palliative Care Consult Note Telephone: [phone]  Fax: [phone]  PATIENT NAME: Madison Maxwell DOB: September 23, 1920 MRN: [id number]  PRIMARY CARE PROVIDER:   Rodrigo Ran, MD Rodrigo Ran, MD [address] [place],  [place] [address]  REFERRING PROVIDER: Rodrigo Ran, MD Rodrigo Ran, MD [address] [place],  [place] [address]  RESPONSIBLE PARTY:  Extended Emergency Contact Information Primary Emergency Contact: [facility] Address: [address]          [place], [place] [address] Darden Amber of [place] Home Phone: [phone] Mobile Phone: [phone] Relation: Son Secondary Emergency Contact: [facility] Address: [address] [place], [place] [address] Darden Amber of [place] Home Phone: [phone] Relation: Daughter Jessice Madill, Bruce's wife [phone]. Couple lives in [place]; call [place] during or after visit Contact Information     Name Relation Home Work Mobile   [place] Son [phone]  [phone]   [facility] Daughter [phone]  [phone]       Visit is to build trust and highlight Palliative Medicine as specialized medical care for people living with serious illness, aimed at facilitating better quality of life through symptoms relief, assisting with advance care planning and complex medical decision making. This is a follow up visit.  RECOMMENDATIONS/PLAN:   Advance Care Planning/Code Status:Patient is a DO NOT RESUSCITATE   Goals of Care: Goals of care include to maximize quality of life and symptom management.  MOST selections include limited additional intervention, IV fluids as indicated antibiotics as indicated, feeding tube for defined trial. NP spoke with Bruce who affirmed that palliative service is what is needed for patient.  Patient/family not ready for hospice service at this time.  Visit consisted of counseling and education dealing with the complex  and emotionally intense issues of symptom management and palliative care in the setting of serious and potentially life-threatening illness. Palliative care team will continue to support patient, patient's family, and medical team.  Symptom management/Plan:  Protein caloric malnutrition: Patient current weight is 98Ibs,; weight stable at 98 pounds for about 1 year. Patient reports appetite is fair. Recommendation: Ensure BID in addition to meals.  Memory loss: At baseline, related to dementia.  Use cueing as appropriate.  Encourage reminiscence, simple words search/puzzles.  Continue galantamine. FAST 6C FLACC 0 Low back pain: Well managed with heating pad,  Tylenol  500 mg 3 times daily as needed for low back pain. Edema to BLE resolved at this time. Continue reduced salt intake, elevate BLE as demonstrated.   Follow up: Palliative care will continue to follow for complex medical decision making, advance care planning, and clarification of goals. Return 6 weeks or prn. Encouraged to call provider sooner with any concerns.  CHIEF COMPLAINT: Palliative follow up  HISTORY OF PRESENT ILLNESS:  Madison Maxwell a 87 y.o. female with multiple medical problems including Dementia, protein caloric malnutrition, Gait imbalance, low back pain, hypertensive heart disease, A. Fib, previous stroke. History obtained from review of EMR, discussion with primary team, family and/or patient. Records reviewed and summarized above. All 10 point systems reviewed and are negative except as documented in history of present illness above  Review and summarization of Epic records shows history from other than patient.   Palliative Care was asked to follow this patient o help address complex decision making in the context of advance care planning and goals of care clarification.   PHYSICAL EXAM  Height/weight: 5 feet 5 inches /  98 pounds General: In no acute distress, appropriately dressed Cardiovascular: regular rate  and rhythm; no edema in BLE Pulmonary: no cough, no increased work of breathing, normal respiratory effort Abdomen: soft, non tender, no guarding, positive bowel sounds in all quadrants GU:  no suprapubic tenderness Eyes: Normal lids, no discharge ENMT: Moist mucous membranes Musculoskeletal:  weakness, severe sarcopenia Skin: no rash to visible skin, warm without cyanosis,  Psych: non-anxious affect Neurological: Weakness but otherwise non focal Heme/lymph/immuno: no bruises, no bleeding  PERTINENT MEDICATIONS:  Outpatient Encounter Medications as of 08/01/2021  Medication Sig   cholecalciferol (VITAMIN D) 1000 units tablet Take 1,000 Units daily with breakfast by mouth.    CREON 36000 units CPEP capsule Take 8 capsules by mouth See admin instructions. Take 2 capsules with meals, and 1 capsule with snacks every day for a total of 8 capsules per day.   galantamine (RAZADYNE ER) 16 MG 24 hr capsule Take 16 mg by mouth daily with breakfast.    metoprolol tartrate (LOPRESSOR) 25 MG tablet Take 12.5 mg by mouth daily.    Multiple Vitamins-Iron (MULTIVITAMINS WITH IRON) TABS tablet Take by mouth.   Multiple Vitamins-Minerals (MULTIVITAMINS THER. W/MINERALS) TABS Take 1 tablet every evening by mouth.    omega-3 fish oil (MAXEPA) 1000 MG CAPS capsule Take 1 capsule daily by mouth.    Probiotic Product (PROBIOTIC DAILY) CAPS Take 1 capsule daily at 6 PM by mouth.   triamcinolone ointment (KENALOG) 0.1 % Apply topically at bedtime.   vitamin B-12 (CYANOCOBALAMIN) 1000 MCG tablet Take 1,000 mcg by mouth daily.   vitamin C (ASCORBIC ACID) 500 MG tablet Take 500 mg by mouth daily.   No facility-administered encounter medications on file as of 08/01/2021.    HOSPICE ELIGIBILITY/DIAGNOSIS: TBD  PAST MEDICAL HISTORY:  Past Medical History:  Diagnosis Date   Allergic rhinitis    Anxiety    Duodenal ulcer    External hemorrhoid    Hyperlipidemia    Memory deficit    Osteoarthritis     Osteoporosis    PAF (paroxysmal atrial fibrillation) (HCC)    a. diagnosed in 05/2017. Started on Xarelto for anticoagulation.    SUI (stress urinary incontinence), female    Vitamin D deficiency      Review lab tests/diagnostics No results for input(s): WBC, HGB, HCT, PLT, MCV in the last 168 hours. No results for input(s): NA, K, CL, CO2, BUN, CREATININE, GLUCOSE in the last 168 hours. CrCl cannot be calculated (Patient's most recent lab result is older than the maximum 21 days allowed.).  ALLERGIES: No Known Allergies    I spent 60  minutes providing this consultation; this includes time spent with patient/family, chart review and documentation. More than 50% of the time in this consultation was spent on counseling and coordinating communication   Thank you for the opportunity to participate in the care of Madison Maxwell Please call our office at [phone] if we can be of additional assistance.  Note: Portions of this note were generated with Scientist, clinical (histocompatibility and immunogenetics). Dictation errors may occur despite best attempts at proofreading.  Rosaura Carpenter, NP

## 2021-08-05 DIAGNOSIS — Z20828 Contact with and (suspected) exposure to other viral communicable diseases: Secondary | ICD-10-CM | POA: Diagnosis not present

## 2021-08-12 DIAGNOSIS — Z20828 Contact with and (suspected) exposure to other viral communicable diseases: Secondary | ICD-10-CM | POA: Diagnosis not present

## 2021-08-16 ENCOUNTER — Encounter: Payer: Self-pay | Admitting: Podiatry

## 2021-08-16 ENCOUNTER — Ambulatory Visit: Payer: Medicare PPO | Admitting: Podiatry

## 2021-08-16 ENCOUNTER — Other Ambulatory Visit: Payer: Self-pay

## 2021-08-16 DIAGNOSIS — M79675 Pain in left toe(s): Secondary | ICD-10-CM

## 2021-08-16 DIAGNOSIS — M79674 Pain in right toe(s): Secondary | ICD-10-CM

## 2021-08-16 DIAGNOSIS — B351 Tinea unguium: Secondary | ICD-10-CM | POA: Diagnosis not present

## 2021-08-23 NOTE — Progress Notes (Signed)
  Subjective:  Patient ID: KAMIA INSALACO, female    DOB: 22-Jul-1920,  MRN: [id number]  87 y.o. female presents painful thick toenails that are difficult to trim. Pain interferes with ambulation. Aggravating factors include wearing enclosed shoe gear. Pain is relieved with periodic professional debridement.  PCP is Rodrigo Ran, MD  was seen less than 3 months ago.  No Known Allergies  Review of Systems: Negative except as noted in the HPI.   Objective:  Vascular Examination: Vascular status intact b/l with palpable pedal pulses. CFT immediate b/l. No edema. No pain with calf compression b/l. Skin temperature gradient WNL b/l.   Neurological Examination: Sensation grossly intact b/l with 10 gram monofilament. Vibratory sensation intact b/l.   Dermatological Examination: Pedal skin with normal turgor, texture and tone b/l. Toenails 1-5 b/l thick, discolored, elongated with subungual debris and pain on dorsal palpation. No hyperkeratotic lesions noted b/l.   Musculoskeletal Examination: Muscle strength 5/5 to b/l LE. HAV with bunion deformity b/l.  Radiographs: None Assessment:   1. Pain due to onychomycosis of toenails of both feet    Plan:  -No new findings. No new orders. -Patient to continue soft, supportive shoe gear daily. -Toenails 1-5 b/l were debrided in length and girth with sterile nail nippers and dremel without iatrogenic bleeding.  -Patient to report any pedal injuries to medical professional immediately. -Patient/POA to call should there be question/concern in the interim.  Return in about 3 months (around 11/15/2021).  Freddie Breech, DPM

## 2021-08-26 DIAGNOSIS — Z8616 Personal history of COVID-19: Secondary | ICD-10-CM | POA: Diagnosis not present

## 2021-09-02 DIAGNOSIS — Z8616 Personal history of COVID-19: Secondary | ICD-10-CM | POA: Diagnosis not present

## 2021-09-09 DIAGNOSIS — Z20828 Contact with and (suspected) exposure to other viral communicable diseases: Secondary | ICD-10-CM | POA: Diagnosis not present

## 2021-09-16 DIAGNOSIS — Z8616 Personal history of COVID-19: Secondary | ICD-10-CM | POA: Diagnosis not present

## 2021-09-23 DIAGNOSIS — Z8616 Personal history of COVID-19: Secondary | ICD-10-CM | POA: Diagnosis not present

## 2021-09-30 DIAGNOSIS — Z20828 Contact with and (suspected) exposure to other viral communicable diseases: Secondary | ICD-10-CM | POA: Diagnosis not present

## 2021-10-14 DIAGNOSIS — Z20822 Contact with and (suspected) exposure to covid-19: Secondary | ICD-10-CM | POA: Diagnosis not present

## 2021-10-28 DIAGNOSIS — Z1159 Encounter for screening for other viral diseases: Secondary | ICD-10-CM | POA: Diagnosis not present

## 2021-10-28 DIAGNOSIS — Z20828 Contact with and (suspected) exposure to other viral communicable diseases: Secondary | ICD-10-CM | POA: Diagnosis not present

## 2021-11-01 DIAGNOSIS — Z961 Presence of intraocular lens: Secondary | ICD-10-CM | POA: Diagnosis not present

## 2021-11-01 DIAGNOSIS — H5203 Hypermetropia, bilateral: Secondary | ICD-10-CM | POA: Diagnosis not present

## 2021-11-01 DIAGNOSIS — H353132 Nonexudative age-related macular degeneration, bilateral, intermediate dry stage: Secondary | ICD-10-CM | POA: Diagnosis not present

## 2021-11-04 DIAGNOSIS — Z1159 Encounter for screening for other viral diseases: Secondary | ICD-10-CM | POA: Diagnosis not present

## 2021-11-04 DIAGNOSIS — Z20828 Contact with and (suspected) exposure to other viral communicable diseases: Secondary | ICD-10-CM | POA: Diagnosis not present

## 2021-11-11 DIAGNOSIS — Z1159 Encounter for screening for other viral diseases: Secondary | ICD-10-CM | POA: Diagnosis not present

## 2021-11-11 DIAGNOSIS — Z20828 Contact with and (suspected) exposure to other viral communicable diseases: Secondary | ICD-10-CM | POA: Diagnosis not present

## 2021-11-13 DIAGNOSIS — Z20828 Contact with and (suspected) exposure to other viral communicable diseases: Secondary | ICD-10-CM | POA: Diagnosis not present

## 2021-11-13 DIAGNOSIS — Z1159 Encounter for screening for other viral diseases: Secondary | ICD-10-CM | POA: Diagnosis not present

## 2021-11-15 DIAGNOSIS — Z1159 Encounter for screening for other viral diseases: Secondary | ICD-10-CM | POA: Diagnosis not present

## 2021-11-15 DIAGNOSIS — Z20828 Contact with and (suspected) exposure to other viral communicable diseases: Secondary | ICD-10-CM | POA: Diagnosis not present

## 2021-11-18 DIAGNOSIS — Z20828 Contact with and (suspected) exposure to other viral communicable diseases: Secondary | ICD-10-CM | POA: Diagnosis not present

## 2021-11-18 DIAGNOSIS — Z1159 Encounter for screening for other viral diseases: Secondary | ICD-10-CM | POA: Diagnosis not present

## 2021-11-19 ENCOUNTER — Ambulatory Visit: Payer: Medicare PPO | Admitting: Podiatry

## 2021-11-19 ENCOUNTER — Other Ambulatory Visit: Payer: Self-pay

## 2021-11-19 ENCOUNTER — Encounter: Payer: Self-pay | Admitting: Podiatry

## 2021-11-19 DIAGNOSIS — M79674 Pain in right toe(s): Secondary | ICD-10-CM | POA: Diagnosis not present

## 2021-11-19 DIAGNOSIS — B351 Tinea unguium: Secondary | ICD-10-CM

## 2021-11-19 DIAGNOSIS — M79675 Pain in left toe(s): Secondary | ICD-10-CM | POA: Diagnosis not present

## 2021-11-20 DIAGNOSIS — Z20828 Contact with and (suspected) exposure to other viral communicable diseases: Secondary | ICD-10-CM | POA: Diagnosis not present

## 2021-11-20 DIAGNOSIS — Z1159 Encounter for screening for other viral diseases: Secondary | ICD-10-CM | POA: Diagnosis not present

## 2021-11-23 ENCOUNTER — Emergency Department (HOSPITAL_COMMUNITY): Payer: Medicare PPO

## 2021-11-23 ENCOUNTER — Inpatient Hospital Stay (HOSPITAL_COMMUNITY)
Admission: EM | Admit: 2021-11-23 | Discharge: 2021-11-26 | DRG: 871 | Disposition: A | Discharge status: Hospice | Payer: Medicare PPO | Source: Skilled Nursing Facility | Attending: Internal Medicine | Admitting: Internal Medicine

## 2021-11-23 DIAGNOSIS — N3000 Acute cystitis without hematuria: Secondary | ICD-10-CM | POA: Diagnosis present

## 2021-11-23 DIAGNOSIS — Z8371 Family history of colonic polyps: Secondary | ICD-10-CM

## 2021-11-23 DIAGNOSIS — Z043 Encounter for examination and observation following other accident: Secondary | ICD-10-CM | POA: Diagnosis not present

## 2021-11-23 DIAGNOSIS — A4159 Other Gram-negative sepsis: Secondary | ICD-10-CM | POA: Diagnosis not present

## 2021-11-23 DIAGNOSIS — N136 Pyonephrosis: Secondary | ICD-10-CM | POA: Diagnosis present

## 2021-11-23 DIAGNOSIS — R52 Pain, unspecified: Secondary | ICD-10-CM | POA: Diagnosis not present

## 2021-11-23 DIAGNOSIS — R739 Hyperglycemia, unspecified: Secondary | ICD-10-CM | POA: Diagnosis not present

## 2021-11-23 DIAGNOSIS — E785 Hyperlipidemia, unspecified: Secondary | ICD-10-CM | POA: Diagnosis present

## 2021-11-23 DIAGNOSIS — Z8673 Personal history of transient ischemic attack (TIA), and cerebral infarction without residual deficits: Secondary | ICD-10-CM

## 2021-11-23 DIAGNOSIS — N1330 Unspecified hydronephrosis: Secondary | ICD-10-CM | POA: Diagnosis not present

## 2021-11-23 DIAGNOSIS — M810 Age-related osteoporosis without current pathological fracture: Secondary | ICD-10-CM | POA: Diagnosis present

## 2021-11-23 DIAGNOSIS — Z20822 Contact with and (suspected) exposure to covid-19: Secondary | ICD-10-CM | POA: Diagnosis present

## 2021-11-23 DIAGNOSIS — G9341 Metabolic encephalopathy: Secondary | ICD-10-CM | POA: Diagnosis present

## 2021-11-23 DIAGNOSIS — N10 Acute tubulo-interstitial nephritis: Secondary | ICD-10-CM | POA: Diagnosis not present

## 2021-11-23 DIAGNOSIS — Z515 Encounter for palliative care: Secondary | ICD-10-CM | POA: Diagnosis not present

## 2021-11-23 DIAGNOSIS — M1990 Unspecified osteoarthritis, unspecified site: Secondary | ICD-10-CM | POA: Diagnosis present

## 2021-11-23 DIAGNOSIS — R0600 Dyspnea, unspecified: Secondary | ICD-10-CM | POA: Diagnosis not present

## 2021-11-23 DIAGNOSIS — L89611 Pressure ulcer of right heel, stage 1: Secondary | ICD-10-CM | POA: Diagnosis present

## 2021-11-23 DIAGNOSIS — Z90710 Acquired absence of both cervix and uterus: Secondary | ICD-10-CM | POA: Diagnosis not present

## 2021-11-23 DIAGNOSIS — N1339 Other hydronephrosis: Secondary | ICD-10-CM | POA: Diagnosis not present

## 2021-11-23 DIAGNOSIS — S0990XA Unspecified injury of head, initial encounter: Secondary | ICD-10-CM | POA: Diagnosis not present

## 2021-11-23 DIAGNOSIS — I480 Paroxysmal atrial fibrillation: Secondary | ICD-10-CM | POA: Diagnosis present

## 2021-11-23 DIAGNOSIS — N201 Calculus of ureter: Secondary | ICD-10-CM | POA: Diagnosis not present

## 2021-11-23 DIAGNOSIS — I10 Essential (primary) hypertension: Secondary | ICD-10-CM | POA: Diagnosis not present

## 2021-11-23 DIAGNOSIS — A419 Sepsis, unspecified organism: Secondary | ICD-10-CM | POA: Diagnosis present

## 2021-11-23 DIAGNOSIS — D696 Thrombocytopenia, unspecified: Secondary | ICD-10-CM | POA: Diagnosis not present

## 2021-11-23 DIAGNOSIS — E872 Acidosis, unspecified: Secondary | ICD-10-CM | POA: Diagnosis present

## 2021-11-23 DIAGNOSIS — L89621 Pressure ulcer of left heel, stage 1: Secondary | ICD-10-CM | POA: Diagnosis present

## 2021-11-23 DIAGNOSIS — R627 Adult failure to thrive: Secondary | ICD-10-CM | POA: Diagnosis present

## 2021-11-23 DIAGNOSIS — Z681 Body mass index (BMI) 19 or less, adult: Secondary | ICD-10-CM | POA: Diagnosis not present

## 2021-11-23 DIAGNOSIS — I672 Cerebral atherosclerosis: Secondary | ICD-10-CM | POA: Diagnosis not present

## 2021-11-23 DIAGNOSIS — E041 Nontoxic single thyroid nodule: Secondary | ICD-10-CM | POA: Diagnosis not present

## 2021-11-23 DIAGNOSIS — F0390 Unspecified dementia without behavioral disturbance: Secondary | ICD-10-CM | POA: Diagnosis present

## 2021-11-23 DIAGNOSIS — W19XXXA Unspecified fall, initial encounter: Secondary | ICD-10-CM | POA: Diagnosis present

## 2021-11-23 DIAGNOSIS — Z8711 Personal history of peptic ulcer disease: Secondary | ICD-10-CM | POA: Diagnosis not present

## 2021-11-23 DIAGNOSIS — R6521 Severe sepsis with septic shock: Secondary | ICD-10-CM | POA: Diagnosis present

## 2021-11-23 DIAGNOSIS — N179 Acute kidney failure, unspecified: Secondary | ICD-10-CM | POA: Diagnosis present

## 2021-11-23 DIAGNOSIS — R000 Tachycardia, unspecified: Secondary | ICD-10-CM | POA: Diagnosis not present

## 2021-11-23 DIAGNOSIS — I6523 Occlusion and stenosis of bilateral carotid arteries: Secondary | ICD-10-CM | POA: Diagnosis not present

## 2021-11-23 DIAGNOSIS — N200 Calculus of kidney: Secondary | ICD-10-CM | POA: Diagnosis not present

## 2021-11-23 DIAGNOSIS — Z79899 Other long term (current) drug therapy: Secondary | ICD-10-CM | POA: Diagnosis not present

## 2021-11-23 DIAGNOSIS — R6520 Severe sepsis without septic shock: Secondary | ICD-10-CM | POA: Diagnosis not present

## 2021-11-23 DIAGNOSIS — M25552 Pain in left hip: Secondary | ICD-10-CM | POA: Diagnosis not present

## 2021-11-23 DIAGNOSIS — K8020 Calculus of gallbladder without cholecystitis without obstruction: Secondary | ICD-10-CM | POA: Diagnosis not present

## 2021-11-23 DIAGNOSIS — I493 Ventricular premature depolarization: Secondary | ICD-10-CM | POA: Diagnosis present

## 2021-11-23 DIAGNOSIS — I739 Peripheral vascular disease, unspecified: Secondary | ICD-10-CM | POA: Diagnosis not present

## 2021-11-23 DIAGNOSIS — S199XXA Unspecified injury of neck, initial encounter: Secondary | ICD-10-CM | POA: Diagnosis not present

## 2021-11-23 DIAGNOSIS — Z66 Do not resuscitate: Secondary | ICD-10-CM | POA: Diagnosis present

## 2021-11-23 LAB — RESP PANEL BY RT-PCR (FLU A&B, COVID) ARPGX2
Influenza A by PCR: NEGATIVE
Influenza B by PCR: NEGATIVE
SARS Coronavirus 2 by RT PCR: NEGATIVE

## 2021-11-23 LAB — URINALYSIS, ROUTINE W REFLEX MICROSCOPIC
Bilirubin Urine: NEGATIVE
Glucose, UA: >=500 mg/dL — AB
Ketones, ur: NEGATIVE mg/dL
Nitrite: POSITIVE — AB
Protein, ur: 100 mg/dL — AB
Specific Gravity, Urine: >1.03 — ABNORMAL HIGH (ref 1.005–1.030)
pH: 6 (ref 5.0–8.0)

## 2021-11-23 LAB — CBG MONITORING, ED: Glucose-Capillary: 239 mg/dL — ABNORMAL HIGH (ref 70–99)

## 2021-11-23 LAB — COMPREHENSIVE METABOLIC PANEL
ALT: 38 U/L (ref 0–44)
AST: 54 U/L — ABNORMAL HIGH (ref 15–41)
Albumin: 3.5 g/dL (ref 3.5–5.0)
Alkaline Phosphatase: 74 U/L (ref 38–126)
Anion gap: 17 — ABNORMAL HIGH (ref 5–15)
BUN: 28 mg/dL — ABNORMAL HIGH (ref 8–23)
CO2: 18 mmol/L — ABNORMAL LOW (ref 22–32)
Calcium: 8.9 mg/dL (ref 8.9–10.3)
Chloride: 102 mmol/L (ref 98–111)
Creatinine, Ser: 1.24 mg/dL — ABNORMAL HIGH (ref 0.44–1.00)
GFR, Estimated: 39 mL/min — ABNORMAL LOW (ref >60)
Glucose, Bld: 234 mg/dL — ABNORMAL HIGH (ref 70–99)
Potassium: 4.2 mmol/L (ref 3.5–5.1)
Sodium: 137 mmol/L (ref 135–145)
Total Bilirubin: 0.9 mg/dL (ref 0.3–1.2)
Total Protein: 6.5 g/dL (ref 6.5–8.1)

## 2021-11-23 LAB — LACTIC ACID, PLASMA: Lactic Acid, Venous: >9 mmol/L (ref 0.5–1.9)

## 2021-11-23 LAB — CK: Total CK: 65 U/L (ref 38–234)

## 2021-11-23 LAB — URINALYSIS, MICROSCOPIC (REFLEX)

## 2021-11-23 MED ORDER — SODIUM CHLORIDE 0.9 % IV SOLN
2.0000 g | INTRAVENOUS | Status: DC
  Administered 2021-11-23 – 2021-11-24 (×2): 2 g via INTRAVENOUS
  Filled 2021-11-23 (×2): qty 20

## 2021-11-23 MED ORDER — LACTATED RINGERS IV BOLUS (SEPSIS)
1000.0000 mL | Freq: Once | INTRAVENOUS | Status: AC
  Administered 2021-11-24: 1000 mL via INTRAVENOUS

## 2021-11-23 MED ORDER — LACTATED RINGERS IV BOLUS (SEPSIS)
1000.0000 mL | Freq: Once | INTRAVENOUS | Status: AC
  Administered 2021-11-23: 23:00:00 1000 mL via INTRAVENOUS

## 2021-11-23 MED ORDER — ACETAMINOPHEN 650 MG RE SUPP
650.0000 mg | Freq: Once | RECTAL | Status: AC
  Administered 2021-11-23: 23:00:00 650 mg via RECTAL
  Filled 2021-11-23: qty 1

## 2021-11-23 MED ORDER — LACTATED RINGERS IV SOLN: INTRAVENOUS | Status: DC

## 2021-11-23 NOTE — ED Provider Notes (Addendum)
[facility] EMERGENCY DEPARTMENT Provider Note   CSN: [id number] Arrival date & time: 11/23/21  2135     History Chief Complaint  Patient presents with   Fall   Altered Mental Status    LEVEL 5 CAVEAT 2/2 DEMENTIA  Madison Maxwell is a 87 y.o. female.  Patient is a 87 year old female with a history of hyperlipidemia, PAF (no longer anticoagulated), and dementia who presents to the emergency department from her skilled nursing facility after an unwitnessed fall.  She is presumed to have been down for no longer than 2 hours.  Fall is estimated to have occurred at approximately 1800 tonight.  Facility reports that patient has had more trouble communicating since the fall.  They note she is ANO x1 at baseline.  Patient complaining of back pain and left hip pain in route to the ED with EMS, but does not express any pain or answer any questions during my encounter at bedside.  C-collar placed by EMS prior to transport.  As of July 2022, patient's code status is DNR. MOST selections include limited additional intervention, IV fluids as indicated antibiotics as indicated, feeding tube for defined trial.   Fall  Altered Mental Status     Past Medical History:  Diagnosis Date   Allergic rhinitis    Anxiety    Duodenal ulcer    External hemorrhoid    Hyperlipidemia    Memory deficit    Osteoarthritis    Osteoporosis    PAF (paroxysmal atrial fibrillation) ([place])    a. diagnosed in 05/2017. Started on Xarelto for anticoagulation.    SUI (stress urinary incontinence), female    Vitamin D deficiency     Patient Active Problem List   Diagnosis Date Noted   Sepsis ([place]) 11/24/2021   Bilateral impacted cerumen 11/12/2020   Sensorineural hearing loss (SNHL), bilateral 11/12/2020   Intracranial bleeding ([place]) 10/05/2017   Fall from slip, trip, or stumble, initial encounter 10/05/2017   Contusion of face 10/05/2017   Hypertensive heart disease without CHF 06/07/2017    Current use of long term anticoagulation 06/07/2017   Leukopenia 06/06/2017   Thrombocytopenia ([place]) 06/06/2017   Paroxysmal atrial fibrillation ([place]) 06/06/2017   History of CVA (cerebrovascular accident) 06/06/2017   Gait disorder 12/23/2016   Dyslipidemia 06/17/2016   Ataxia    Memory deficit     Past Surgical History:  Procedure Laterality Date   APPENDECTOMY     VAGINAL HYSTERECTOMY       OB History   No obstetric history on file.     Family History  Problem Relation Age of Onset   Colon polyps Sister    Colon cancer Neg Hx     Social History   Tobacco Use   Smoking status: Never   Smokeless tobacco: Never  Vaping Use   Vaping Use: Never used  Substance Use Topics   Alcohol use: No   Drug use: No    Home Medications Prior to Admission medications   Medication Sig Start Date End Date Taking? Authorizing Provider  cholecalciferol (VITAMIN D) 25 MCG (1000 UNIT) tablet Take 1,000 Units by mouth daily with breakfast.   Yes [provider]  Denosumab (PROLIA [place]) Biannually as needed IM   Yes [provider]  galantamine (RAZADYNE ER) 16 MG 24 hr capsule Take 16 mg by mouth daily with breakfast.  10/29/18  Yes [provider]  lipase/protease/amylase (CREON) 36000 UNITS CPEP capsule Take [id number] Units by mouth See admin instructions.  Take 2 capsules (72000 units) by mouth with meals and 1 capsule (36000 units) with snacks- total 8 capsules daily. 7am, 11am, 6pm, 9pm   Yes [provider]  metoprolol tartrate (LOPRESSOR) 25 MG tablet Take 12.5 mg by mouth every morning.   Yes [provider]  Multiple Vitamin (MULTIVITAMIN WITH MINERALS) TABS tablet Take 1 tablet by mouth every evening.   Yes [provider]  Omega-3 1000 MG CAPS Take 1,000 mg by mouth daily.   Yes [provider]  Probiotic Product (PROBIOTIC DAILY) CAPS Take 1 capsule by mouth every evening.   Yes [provider]   vitamin B-12 (CYANOCOBALAMIN) 1000 MCG tablet Take 1,000 mcg by mouth every evening.   Yes [provider]  vitamin C (ASCORBIC ACID) 500 MG tablet Take 500 mg by mouth every evening.   Yes [provider]    Allergies    Patient has no known allergies.  Review of Systems   Review of Systems  Unable to perform ROS: Dementia    Physical Exam Updated Vital Signs BP (!) 143/61    Pulse (!) 103    Temp (!) 101.9 F (38.8 C) (Rectal)    Resp 20    SpO2 97%   Physical Exam Vitals and nursing note reviewed.  Constitutional:      Appearance: She is well-developed. She is not diaphoretic.     Comments: Chronically frail appearing. Agitated.   HENT:     Head: Normocephalic and atraumatic.     Comments: No battle sign or raccoon's eyes.  Unable to appreciate any hematoma or contusion to scalp.    Right Ear: External ear normal.     Left Ear: External ear normal.     Mouth/Throat:     Mouth: Mucous membranes are dry.  Eyes:     General: No scleral icterus.    Extraocular Movements: Extraocular movements intact.     Conjunctiva/sclera: Conjunctivae normal.     Pupils: Pupils are equal, round, and reactive to light.  Cardiovascular:     Rate and Rhythm: Regular rhythm. Tachycardia present.     Pulses: Normal pulses.     Comments: Appears sinus tach on monitor Pulmonary:     Effort: Pulmonary effort is normal. No respiratory distress.     Breath sounds: No stridor. No wheezing.     Comments: Respirations even and unlabored Abdominal:     General: There is no distension.     Palpations: Abdomen is soft.     Comments: Abdomen is soft, nondistended. No palpable masses or involuntary guarding.  Musculoskeletal:        General: Normal range of motion.     Cervical back: Normal range of motion.     Comments: No appreciable leg shortening or malrotation.  Skin:    General: Skin is warm and dry.     Coloration: Skin is not pale.     Findings: No erythema or rash.   Neurological:     Mental Status: She is alert.     Comments: Alert. Moving all extremities spontaneously, symmetrically.  Psychiatric:        Speech: She is noncommunicative.        Behavior: Behavior is agitated.    ED Results / Procedures / Treatments   Labs (all labs ordered are listed, but only abnormal results are displayed) Labs Reviewed  LACTIC ACID, PLASMA - Abnormal; Notable for the following components:      Result Value   Lactic Acid,  Venous >9.0 (*)    All other components within normal limits  LACTIC ACID, PLASMA - Abnormal; Notable for the following components:   Lactic Acid, Venous 8.3 (*)    All other components within normal limits  COMPREHENSIVE METABOLIC PANEL - Abnormal; Notable for the following components:   CO2 18 (*)    Glucose, Bld 234 (*)    BUN 28 (*)    Creatinine, Ser 1.24 (*)    AST 54 (*)    GFR, Estimated 39 (*)    Anion gap 17 (*)    All other components within normal limits  CBC WITH DIFFERENTIAL/PLATELET - Abnormal; Notable for the following components:   Platelets 147 (*)    Neutro Abs 8.9 (*)    Lymphs Abs 0.3 (*)    Monocytes Absolute 0.0 (*)    All other components within normal limits  URINALYSIS, ROUTINE W REFLEX MICROSCOPIC - Abnormal; Notable for the following components:   Specific Gravity, Urine >1.030 (*)    Glucose, UA >=500 (*)    Hgb urine dipstick TRACE (*)    Protein, ur 100 (*)    Nitrite POSITIVE (*)    Leukocytes,Ua TRACE (*)    All other components within normal limits  URINALYSIS, MICROSCOPIC (REFLEX) - Abnormal; Notable for the following components:   Bacteria, UA MANY (*)    All other components within normal limits  CBG MONITORING, ED - Abnormal; Notable for the following components:   Glucose-Capillary 239 (*)    All other components within normal limits  RESP PANEL BY RT-PCR (FLU A&B, COVID) ARPGX2  CULTURE, BLOOD (ROUTINE X 2)  CULTURE, BLOOD (ROUTINE X 2)  URINE CULTURE  CK  PROTIME-INR     EKG EKG Interpretation  Date/Time:  Saturday November 23 2021 22:38:23 EST Ventricular Rate:  123 PR Interval:  149 QRS Duration: 92 QT Interval:  308 QTC Calculation: 441 R Axis:   40 Text Interpretation: Sinus tachycardia Atrial premature complex Consider right atrial enlargement Repolarization abnormality, prob rate related No acute changes No significant change since last tracing Confirmed by Reta, Norgren [phone]) on 11/23/2021 11:44:01 PM  Radiology CT HEAD WO CONTRAST ( )  Result Date: 11/23/2021 CLINICAL DATA:  Head trauma.  Fall. EXAM: CT HEAD WITHOUT CONTRAST CT CERVICAL SPINE WITHOUT CONTRAST TECHNIQUE: Multidetector CT imaging of the head and cervical spine was performed following the standard protocol without intravenous contrast. Multiplanar CT image reconstructions of the cervical spine were also generated. COMPARISON:  None. FINDINGS: CT HEAD FINDINGS Brain: There is no mass, hemorrhage or extra-axial collection. There is generalized atrophy without lobar predilection. There is hypoattenuation of the periventricular white matter, most commonly indicating chronic ischemic microangiopathy. Vascular: Atherosclerotic calcification of the internal carotid arteries at the skull base. No abnormal hyperdensity of the major intracranial arteries or dural venous sinuses. Skull: The visualized skull base, calvarium and extracranial soft tissues are normal. Sinuses/Orbits: No fluid levels or advanced mucosal thickening of the visualized paranasal sinuses. No mastoid or middle ear effusion. The orbits are normal. CT CERVICAL SPINE FINDINGS Alignment: Grade 1 anterolisthesis at C3-4 and C4-5. Skull base and vertebrae: No acute fracture. Soft tissues and spinal canal: No prevertebral fluid or swelling. No visible canal hematoma. Disc levels: No advanced spinal canal or neural foraminal stenosis. Upper chest: No pneumothorax, pulmonary nodule or pleural effusion. Other: Unchanged appearance  of exophytic right thyroid nodule measuring 1.4 cm. IMPRESSION: 1. Chronic ischemic microangiopathy and generalized atrophy without acute intracranial abnormality. 2. No acute fracture or  static subluxation of the cervical spine. 3. 1.4 cm right thyroid nodule, unchanged. Not clinically significant; no follow-up imaging recommended (ref: J Am Coll Radiol. 2015 Feb;12(2): 143-50). Electronically Signed   By: Ulyses Jarred M.D.   On: 11/23/2021 23:52   CT Cervical Spine Wo Contrast  Result Date: 11/23/2021 CLINICAL DATA:  Head trauma.  Fall. EXAM: CT HEAD WITHOUT CONTRAST CT CERVICAL SPINE WITHOUT CONTRAST TECHNIQUE: Multidetector CT imaging of the head and cervical spine was performed following the standard protocol without intravenous contrast. Multiplanar CT image reconstructions of the cervical spine were also generated. COMPARISON:  None. FINDINGS: CT HEAD FINDINGS Brain: There is no mass, hemorrhage or extra-axial collection. There is generalized atrophy without lobar predilection. There is hypoattenuation of the periventricular white matter, most commonly indicating chronic ischemic microangiopathy. Vascular: Atherosclerotic calcification of the internal carotid arteries at the skull base. No abnormal hyperdensity of the major intracranial arteries or dural venous sinuses. Skull: The visualized skull base, calvarium and extracranial soft tissues are normal. Sinuses/Orbits: No fluid levels or advanced mucosal thickening of the visualized paranasal sinuses. No mastoid or middle ear effusion. The orbits are normal. CT CERVICAL SPINE FINDINGS Alignment: Grade 1 anterolisthesis at C3-4 and C4-5. Skull base and vertebrae: No acute fracture. Soft tissues and spinal canal: No prevertebral fluid or swelling. No visible canal hematoma. Disc levels: No advanced spinal canal or neural foraminal stenosis. Upper chest: No pneumothorax, pulmonary nodule or pleural effusion. Other: Unchanged appearance of exophytic right  thyroid nodule measuring 1.4 cm. IMPRESSION: 1. Chronic ischemic microangiopathy and generalized atrophy without acute intracranial abnormality. 2. No acute fracture or static subluxation of the cervical spine. 3. 1.4 cm right thyroid nodule, unchanged. Not clinically significant; no follow-up imaging recommended (ref: J Am Coll Radiol. 2015 Feb;12(2): 143-50). Electronically Signed   By: Ulyses Jarred M.D.   On: 11/23/2021 23:52   DG Pelvis Portable  Result Date: 11/24/2021 CLINICAL DATA:  Fall EXAM: PORTABLE CHEST 1 VIEW PORTABLE PELVIS 1 VIEW COMPARISON:  None. FINDINGS: The heart size and mediastinal contours are within normal limits. Both lungs are clear. The visualized skeletal structures are unremarkable. No pelvic fracture. IMPRESSION: No active disease. No pelvic fracture. Electronically Signed   By: Ulyses Jarred M.D.   On: 11/24/2021 00:43   DG Chest Port 1 View  Result Date: 11/24/2021 CLINICAL DATA:  Fall EXAM: PORTABLE CHEST 1 VIEW PORTABLE PELVIS 1 VIEW COMPARISON:  None. FINDINGS: The heart size and mediastinal contours are within normal limits. Both lungs are clear. The visualized skeletal structures are unremarkable. No pelvic fracture. IMPRESSION: No active disease. No pelvic fracture. Electronically Signed   By: Ulyses Jarred M.D.   On: 11/24/2021 00:43    Procedures .Critical Care Performed by: Antonietta Breach, PA-C Authorized by: Antonietta Breach, PA-C   Critical care provider statement:    Critical care time (minutes):  50   Critical care was necessary to treat or prevent imminent or life-threatening deterioration of the following conditions:  Sepsis   Critical care was time spent personally by me on the following activities:  Development of treatment plan with patient or surrogate, discussions with consultants, evaluation of patient's response to treatment, examination of patient, ordering and review of laboratory studies, ordering and review of radiographic studies, ordering and  performing treatments and interventions, pulse oximetry, re-evaluation of patient's condition, review of old charts and obtaining history from patient or surrogate   I assumed direction of critical care for this patient from another provider in my specialty:  no     Care discussed with: admitting provider     Medications Ordered in ED Medications  lactated ringers infusion (0 mLs Intravenous Paused 11/24/21 0014)  cefTRIAXone (ROCEPHIN) 2 g in sodium chloride 0.9 % 100 mL IVPB (0 g Intravenous Stopped 11/24/21 0029)  lactated ringers bolus 1,000 mL (1,000 mLs Intravenous New Bag/Given 11/24/21 0029)  lactated ringers bolus 1,000 mL (0 mLs Intravenous Stopped 11/23/21 2354)  acetaminophen (TYLENOL) suppository 650 mg (650 mg Rectal Given 11/23/21 2239)    ED Course  I have reviewed the triage vital signs and the nursing notes.  Pertinent labs & imaging results that were available during my care of the patient were reviewed by me and considered in my medical decision making (see chart for details).  Clinical Course as of 11/24/21 0126  Sat Nov 23, 2021  2234 Son notified of patient arrival. He is en route to the ED. Will discuss patient's presentation more in person upon his arrival. [KH]  2235 Spoke with son at bedside who is in agreement with pending work up. States that patient was more lethargic this afternoon when they were visiting her around 1600. Patient c/o hip and flank pain during this visit with son as well.  [KH]  2312 UA c/w UTI. Rocephin ordered per sepsis order set. [KH]  Sun Nov 24, 2021  [id number] Imaging without evidence of secondary infectious source or traumatic pathology.  Hospitalist paged for admission.  Heart rate is improving with fluids.  Lactate is downtrending. [KH]  0110 Dr. Hal Hope of [facility] to admit [KH]    Clinical Course User Index [KH] Antonietta Breach, PA-C    Sepsis - Repeat Assessment  Performed at:    0100  Vitals     Blood pressure (!) 143/61, pulse (!)  103, temperature (!) 101.9 F (38.8 C), temperature source Rectal, resp. rate 20, SpO2 97 %.  Heart:     Tachycardic  Lungs:    CTA  Capillary Refill:   <2 sec  Peripheral Pulse:   Radial pulse palpable   MDM Rules/Calculators/A&P                          87 year old female to be admitted to the hospital for sepsis picture.  This appears to be secondary to a urinary tract infection.  Initially tachycardic and febrile.  Vital signs improving with antipyretics and IV fluids.  Blood pressure has remained stable.  Patient receiving IV Rocephin for management of UTI.  Will admit to the hospitalist service.  Son at bedside agreeable with plan.   Final Clinical Impression(s) / ED Diagnoses Final diagnoses:  Sepsis, due to unspecified organism, unspecified whether acute organ dysfunction present [facility])  Acute cystitis without hematuria  AKI (acute kidney injury) ([place])  Fall, initial encounter    Rx / DC Orders ED Discharge Orders     None        Antonietta Breach, PA-C 11/24/21 0125    Antonietta Breach, PA-C 11/24/21 0126    Antonietta Breach, PA-C 11/24/21 2344    Varney Biles, MD 11/24/21 2348

## 2021-11-23 NOTE — ED Triage Notes (Signed)
Per [facility], pt from [facility] for fall at approx 6pm. Unknown down time - less than 2 hours per facility. Pt with hx of dementia and A&Ox1 at baseline but facility states she has been having more trouble communicating since the fall. No deficits. Pt c/o back pain and L hip pain - hips stable per ems. C collar in place on arrival.   BP 138/96, P 82, RR 16, CBG 287

## 2021-11-23 NOTE — Sepsis Progress Note (Signed)
Elink following Code Sepsis. 

## 2021-11-24 ENCOUNTER — Inpatient Hospital Stay (HOSPITAL_COMMUNITY): Payer: Medicare PPO

## 2021-11-24 ENCOUNTER — Encounter (HOSPITAL_COMMUNITY): Payer: Self-pay | Admitting: Internal Medicine

## 2021-11-24 ENCOUNTER — Emergency Department (HOSPITAL_COMMUNITY): Payer: Medicare PPO

## 2021-11-24 DIAGNOSIS — Z20822 Contact with and (suspected) exposure to covid-19: Secondary | ICD-10-CM | POA: Diagnosis present

## 2021-11-24 DIAGNOSIS — N3000 Acute cystitis without hematuria: Secondary | ICD-10-CM | POA: Diagnosis present

## 2021-11-24 DIAGNOSIS — A419 Sepsis, unspecified organism: Secondary | ICD-10-CM | POA: Diagnosis not present

## 2021-11-24 DIAGNOSIS — Z66 Do not resuscitate: Secondary | ICD-10-CM | POA: Diagnosis present

## 2021-11-24 DIAGNOSIS — Z90710 Acquired absence of both cervix and uterus: Secondary | ICD-10-CM | POA: Diagnosis not present

## 2021-11-24 DIAGNOSIS — Z8371 Family history of colonic polyps: Secondary | ICD-10-CM | POA: Diagnosis not present

## 2021-11-24 DIAGNOSIS — A4159 Other Gram-negative sepsis: Secondary | ICD-10-CM | POA: Diagnosis present

## 2021-11-24 DIAGNOSIS — E785 Hyperlipidemia, unspecified: Secondary | ICD-10-CM | POA: Diagnosis present

## 2021-11-24 DIAGNOSIS — Z8673 Personal history of transient ischemic attack (TIA), and cerebral infarction without residual deficits: Secondary | ICD-10-CM | POA: Diagnosis not present

## 2021-11-24 DIAGNOSIS — N136 Pyonephrosis: Secondary | ICD-10-CM | POA: Diagnosis present

## 2021-11-24 DIAGNOSIS — F0390 Unspecified dementia without behavioral disturbance: Secondary | ICD-10-CM | POA: Diagnosis present

## 2021-11-24 DIAGNOSIS — W19XXXA Unspecified fall, initial encounter: Secondary | ICD-10-CM | POA: Diagnosis present

## 2021-11-24 DIAGNOSIS — R627 Adult failure to thrive: Secondary | ICD-10-CM | POA: Diagnosis present

## 2021-11-24 DIAGNOSIS — Z79899 Other long term (current) drug therapy: Secondary | ICD-10-CM | POA: Diagnosis not present

## 2021-11-24 DIAGNOSIS — R52 Pain, unspecified: Secondary | ICD-10-CM | POA: Diagnosis not present

## 2021-11-24 DIAGNOSIS — L89611 Pressure ulcer of right heel, stage 1: Secondary | ICD-10-CM | POA: Diagnosis present

## 2021-11-24 DIAGNOSIS — R6521 Severe sepsis with septic shock: Secondary | ICD-10-CM | POA: Diagnosis present

## 2021-11-24 DIAGNOSIS — G9341 Metabolic encephalopathy: Secondary | ICD-10-CM | POA: Diagnosis present

## 2021-11-24 DIAGNOSIS — Z681 Body mass index (BMI) 19 or less, adult: Secondary | ICD-10-CM | POA: Diagnosis not present

## 2021-11-24 DIAGNOSIS — M810 Age-related osteoporosis without current pathological fracture: Secondary | ICD-10-CM | POA: Diagnosis present

## 2021-11-24 DIAGNOSIS — E872 Acidosis, unspecified: Secondary | ICD-10-CM | POA: Diagnosis present

## 2021-11-24 DIAGNOSIS — N10 Acute tubulo-interstitial nephritis: Secondary | ICD-10-CM | POA: Diagnosis not present

## 2021-11-24 DIAGNOSIS — Z8711 Personal history of peptic ulcer disease: Secondary | ICD-10-CM | POA: Diagnosis not present

## 2021-11-24 DIAGNOSIS — L89621 Pressure ulcer of left heel, stage 1: Secondary | ICD-10-CM | POA: Diagnosis present

## 2021-11-24 DIAGNOSIS — I480 Paroxysmal atrial fibrillation: Secondary | ICD-10-CM | POA: Diagnosis present

## 2021-11-24 DIAGNOSIS — D696 Thrombocytopenia, unspecified: Secondary | ICD-10-CM | POA: Diagnosis present

## 2021-11-24 DIAGNOSIS — Z515 Encounter for palliative care: Secondary | ICD-10-CM | POA: Diagnosis not present

## 2021-11-24 DIAGNOSIS — M1990 Unspecified osteoarthritis, unspecified site: Secondary | ICD-10-CM | POA: Diagnosis present

## 2021-11-24 DIAGNOSIS — N179 Acute kidney failure, unspecified: Secondary | ICD-10-CM | POA: Diagnosis present

## 2021-11-24 LAB — CBC
HCT: 35.8 % — ABNORMAL LOW (ref 36.0–46.0)
Hemoglobin: 12.1 g/dL (ref 12.0–15.0)
MCH: 31.6 pg (ref 26.0–34.0)
MCHC: 33.8 g/dL (ref 30.0–36.0)
MCV: 93.5 fL (ref 80.0–100.0)
Platelets: UNDETERMINED 10*3/uL (ref 150–400)
RBC: 3.83 MIL/uL — ABNORMAL LOW (ref 3.87–5.11)
RDW: 13 % (ref 11.5–15.5)
WBC: 12.7 10*3/uL — ABNORMAL HIGH (ref 4.0–10.5)
nRBC: 0 % (ref 0.0–0.2)

## 2021-11-24 LAB — BLOOD CULTURE ID PANEL (REFLEXED) - BCID2

## 2021-11-24 LAB — HEPARIN LEVEL (UNFRACTIONATED): Heparin Unfractionated: <0.1 IU/mL — ABNORMAL LOW (ref 0.30–0.70)

## 2021-11-24 LAB — CREATININE, SERUM
Creatinine, Ser: 1.25 mg/dL — ABNORMAL HIGH (ref 0.44–1.00)
GFR, Estimated: 39 mL/min — ABNORMAL LOW (ref >60)

## 2021-11-24 LAB — CBC WITH DIFFERENTIAL/PLATELET
Abs Immature Granulocytes: 0 10*3/uL (ref 0.00–0.07)
Band Neutrophils: 46 %
Basophils Absolute: 0 10*3/uL (ref 0.0–0.1)
Basophils Relative: 0 %
Eosinophils Absolute: 0 10*3/uL (ref 0.0–0.5)
Eosinophils Relative: 0 %
HCT: 44.2 % (ref 36.0–46.0)
Hemoglobin: 14.3 g/dL (ref 12.0–15.0)
Lymphocytes Relative: 3 %
Lymphs Abs: 0.3 10*3/uL — ABNORMAL LOW (ref 0.7–4.0)
MCH: 31.4 pg (ref 26.0–34.0)
MCHC: 32.4 g/dL (ref 30.0–36.0)
MCV: 97.1 fL (ref 80.0–100.0)
Monocytes Absolute: 0 10*3/uL — ABNORMAL LOW (ref 0.1–1.0)
Monocytes Relative: 0 %
Neutro Abs: 8.9 10*3/uL — ABNORMAL HIGH (ref 1.7–7.7)
Neutrophils Relative %: 51 %
Platelets: 147 10*3/uL — ABNORMAL LOW (ref 150–400)
RBC: 4.55 MIL/uL (ref 3.87–5.11)
RDW: 12.9 % (ref 11.5–15.5)
WBC Morphology: INCREASED
WBC: 9.2 10*3/uL (ref 4.0–10.5)
nRBC: 0 % (ref 0.0–0.2)
nRBC: 0 /100 WBC

## 2021-11-24 LAB — HEMOGLOBIN A1C
Hgb A1c MFr Bld: 5.6 % (ref 4.8–5.6)
Mean Plasma Glucose: 114.02 mg/dL

## 2021-11-24 LAB — GLUCOSE, CAPILLARY
Glucose-Capillary: 130 mg/dL — ABNORMAL HIGH (ref 70–99)
Glucose-Capillary: 154 mg/dL — ABNORMAL HIGH (ref 70–99)

## 2021-11-24 LAB — LACTIC ACID, PLASMA: Lactic Acid, Venous: 8.3 mmol/L (ref 0.5–1.9)

## 2021-11-24 LAB — TSH: TSH: 3.147 u[IU]/mL (ref 0.350–4.500)

## 2021-11-24 MED ORDER — PANCRELIPASE (LIP-PROT-AMYL) 12000-38000 UNITS PO CPEP
72000.0000 [IU] | ORAL_CAPSULE | Freq: Three times a day (TID) | ORAL | Status: DC
  Filled 2021-11-24 (×2): qty 2
  Filled 2021-11-24: qty 6

## 2021-11-24 MED ORDER — AMIODARONE LOAD VIA INFUSION
150.0000 mg | Freq: Once | INTRAVENOUS | Status: AC
  Administered 2021-11-24: 08:00:00 150 mg via INTRAVENOUS
  Filled 2021-11-24: qty 83.34

## 2021-11-24 MED ORDER — HEPARIN (PORCINE) 25000 UT/250ML-% IV SOLN
800.0000 [IU]/h | INTRAVENOUS | Status: DC
  Administered 2021-11-24: 11:00:00 650 [IU]/h via INTRAVENOUS
  Filled 2021-11-24: qty 250

## 2021-11-24 MED ORDER — ACETAMINOPHEN 325 MG PO TABS: 650.0000 mg | ORAL_TABLET | Freq: Four times a day (QID) | ORAL | Status: DC | PRN

## 2021-11-24 MED ORDER — AMIODARONE HCL IN DEXTROSE 360-4.14 MG/200ML-% IV SOLN
60.0000 mg/h | INTRAVENOUS | Status: DC
  Administered 2021-11-24 – 2021-11-25 (×4): 60 mg/h via INTRAVENOUS
  Filled 2021-11-24 (×4): qty 200

## 2021-11-24 MED ORDER — AMIODARONE HCL IN DEXTROSE 360-4.14 MG/200ML-% IV SOLN
60.0000 mg/h | INTRAVENOUS | Status: AC
  Administered 2021-11-24: 08:00:00 60 mg/h via INTRAVENOUS
  Filled 2021-11-24: qty 200

## 2021-11-24 MED ORDER — LACTATED RINGERS IV SOLN: INTRAVENOUS | Status: AC

## 2021-11-24 MED ORDER — GALANTAMINE HYDROBROMIDE ER 8 MG PO CP24
16.0000 mg | ORAL_CAPSULE | Freq: Every day | ORAL | Status: DC
  Filled 2021-11-24 (×2): qty 2

## 2021-11-24 MED ORDER — SODIUM CHLORIDE 0.9 % IV BOLUS
250.0000 mL | Freq: Once | INTRAVENOUS | Status: AC
  Administered 2021-11-24: 11:00:00 250 mL via INTRAVENOUS

## 2021-11-24 MED ORDER — ENOXAPARIN SODIUM 40 MG/0.4ML IJ SOSY: 40.0000 mg | PREFILLED_SYRINGE | INTRAMUSCULAR | Status: DC

## 2021-11-24 MED ORDER — METOPROLOL TARTRATE 12.5 MG HALF TABLET: 12.5000 mg | ORAL_TABLET | Freq: Every morning | ORAL | Status: DC

## 2021-11-24 MED ORDER — ACETAMINOPHEN 650 MG RE SUPP: 650.0000 mg | Freq: Four times a day (QID) | RECTAL | Status: DC | PRN

## 2021-11-24 MED ORDER — PANCRELIPASE (LIP-PROT-AMYL) 36000-114000 UNITS PO CPEP: 36000.0000 [IU] | ORAL_CAPSULE | ORAL | Status: DC

## 2021-11-24 MED ORDER — PANCRELIPASE (LIP-PROT-AMYL) 12000-38000 UNITS PO CPEP
36000.0000 [IU] | ORAL_CAPSULE | ORAL | Status: DC | PRN
  Filled 2021-11-24: qty 1

## 2021-11-24 MED ORDER — VITAMIN B-12 1000 MCG PO TABS: 1000.0000 ug | ORAL_TABLET | Freq: Every evening | ORAL | Status: DC

## 2021-11-24 MED ORDER — HEPARIN BOLUS VIA INFUSION
1500.0000 [IU] | Freq: Once | INTRAVENOUS | Status: AC
  Administered 2021-11-24: 20:00:00 1500 [IU] via INTRAVENOUS
  Filled 2021-11-24: qty 1500

## 2021-11-24 NOTE — Progress Notes (Signed)
ANTICOAGULATION CONSULT NOTE - Initial Consult  Pharmacy Consult for heparin Indication: atrial fibrillation  No Known Allergies  Patient Measurements: Height: 5\' 5"  (165.1 cm) Weight: 49.5 kg (109 lb 2 oz) IBW/kg (Calculated) : 57 Heparin Dosing Weight: 49.5 kg  Vital Signs: Temp: 99.3 F (37.4 C) (12/25 1700) Temp Source: Axillary (12/25 1700) BP: 150/48 (12/25 1900) Pulse Rate: 74 (12/25 1900)  Labs: Recent Labs    11/23/21 2222 11/24/21 0612 11/24/21 1800  HGB 14.3 12.1  --   HCT 44.2 35.8*  --   PLT 147* PLATELET CLUMPS NOTED ON SMEAR, UNABLE TO ESTIMATE  --   HEPARINUNFRC  --   --  <0.10*  CREATININE 1.24* 1.25*  --   CKTOTAL 65  --   --      Estimated Creatinine Clearance: 19.2 mL/min (A) (by C-G formula based on SCr of 1.25 mg/dL (H)).   Medical History: Past Medical History:  Diagnosis Date   Allergic rhinitis    Anxiety    Duodenal ulcer    External hemorrhoid    Hyperlipidemia    Memory deficit    Osteoarthritis    Osteoporosis    PAF (paroxysmal atrial fibrillation) ([facility])    a. diagnosed in 05/2017. Initially on Xarelto, d/c'd after fall w/ ICH.   SUI (stress urinary incontinence), female    Vitamin D deficiency     Medications:  Scheduled:   galantamine  16 mg Oral Q breakfast   lipase/protease/amylase  72,000 Units Oral TID WC   vitamin B-12  1,000 mcg Oral QPM   Infusions:   amiodarone 60 mg/hr (11/24/21 1703)   cefTRIAXone (ROCEPHIN)  IV Stopped (11/24/21 0029)   heparin 650 Units/hr (11/24/21 1038)   lactated ringers 75 mL/hr at 11/24/21 11/26/21    Assessment: Patient presented with sepsis, precipitating afib with rapid rate. Patient was previously on Xarelto, which was stopped in 2019 due to multiple falls. Pharmacy is consulted to initiate heparin drip while inpatient.   Patient has AKI with SCr 1.25. CBC stable. Heparin level subtherapeutic at <0.10 on 650 units/hour. Nurse reports no issues with infusion or signs of bleeding.    Goal of Therapy:  Heparin level 0.3-0.7 Monitor platelets by anticoagulation protocol: Yes   Plan:  Give heparin bolus of 1500 units IV once Increase heparin rate to 800 units/hour Check heparin level in 8 hours  Thank you for allowing pharmacy to participate in this patient's care.  2020, PharmD Pharmacy Resident 11/24/2021, 7:46 PM

## 2021-11-24 NOTE — Progress Notes (Signed)
Patient ID: Madison Maxwell, female   DOB: October 17, 1920, 87 y.o.   MRN: [id number] Patient admitted early this morning for sepsis possibly from urinary tract infection and has been started on IV fluids and antibiotics.  Patient seen and examined at bedside.  I have reviewed patient's current medical records including this morning's H&P, current vitals, labs and medications myself.  Heart rate apparently went up to the 180s as per nursing staff with blood pressure in the low 100s.  Doubt that she will be able to tolerate metoprolol or Cardizem.  We will start amiodarone drip.  I have consulted cardiology.  Follow recommendations.  Follow cultures; continue antibiotics.  Decrease IV fluids to 75 cc an hour.

## 2021-11-24 NOTE — H&P (Signed)
History and Physical    Madison Maxwell [id number] DOB: 07-28-1920 DOA: 11/23/2021  PCP: Crist Infante, MD  Patient coming from: Assisted living facility.  History obtained from patient's son.  Chief Complaint: Found on the floor.  HPI: Madison Maxwell is a 87 y.o. female with history of dementia, paroxysmal atrial fibrillation not on anticoagulation was brought to the ER after patient was found to be on the floor.  As per patient's son patient has been lethargic yesterday and not herself.  She was not eating well.  Later when he left the assisted living facility nurse call that he she had a fall.  Patient usually walks with the help of walker to the table in her room.  Otherwise she is requiring complete assist because of her dementia.  ED Course: In the ER patient had a fever of 101.9 F tachycardic with lactic acid level initially more than 9 UA is consistent with UTI chest x-ray unremarkable CT head and C-spine were unremarkable.  X-ray pelvis did not show any fracture.  On exam patient does have some diffuse abdominal tenderness.  Patient was started on fluid bolus for sepsis following which lactic acid improved and also antibiotics.  Patient admitted for sepsis.  Review of Systems: As per HPI, rest all negative.   Past Medical History:  Diagnosis Date   Allergic rhinitis    Anxiety    Duodenal ulcer    External hemorrhoid    Hyperlipidemia    Memory deficit    Osteoarthritis    Osteoporosis    PAF (paroxysmal atrial fibrillation) ([place])    a. diagnosed in 05/2017. Started on Xarelto for anticoagulation.    SUI (stress urinary incontinence), female    Vitamin D deficiency     Past Surgical History:  Procedure Laterality Date   APPENDECTOMY     VAGINAL HYSTERECTOMY       reports that she has never smoked. She has never used smokeless tobacco. She reports that she does not drink alcohol and does not use drugs.  No Known Allergies  Family History  Problem Relation  Age of Onset   Colon polyps Sister    Colon cancer Neg Hx     Prior to Admission medications   Medication Sig Start Date End Date Taking? Authorizing Provider  cholecalciferol (VITAMIN D) 25 MCG (1000 UNIT) tablet Take 1,000 Units by mouth daily with breakfast.   Yes [provider]  Denosumab (PROLIA [facility]) Biannually as needed IM   Yes [provider]  galantamine (RAZADYNE ER) 16 MG 24 hr capsule Take 16 mg by mouth daily with breakfast.  10/29/18  Yes [provider]  lipase/protease/amylase (CREON) 36000 UNITS CPEP capsule Take [id number] Units by mouth See admin instructions. Take 2 capsules (72000 units) by mouth with meals and 1 capsule (36000 units) with snacks- total 8 capsules daily. 7am, 11am, 6pm, 9pm   Yes [provider]  metoprolol tartrate (LOPRESSOR) 25 MG tablet Take 12.5 mg by mouth every morning.   Yes [provider]  Multiple Vitamin (MULTIVITAMIN WITH MINERALS) TABS tablet Take 1 tablet by mouth every evening.   Yes [provider]  Omega-3 1000 MG CAPS Take 1,000 mg by mouth daily.   Yes [provider]  Probiotic Product (PROBIOTIC DAILY) CAPS Take 1 capsule by mouth every evening.   Yes [provider]  vitamin B-12 (CYANOCOBALAMIN) 1000 MCG tablet Take 1,000 mcg by mouth every evening.   Yes [provider]  vitamin  C (ASCORBIC ACID) 500 MG tablet Take 500 mg by mouth every evening.   Yes [provider]    Physical Exam: Constitutional: Moderately built and nourished. Vitals:   11/24/21 0030 11/24/21 0130 11/24/21 0330 11/24/21 0343  BP: (!) 143/61 135/64 132/77   Pulse: (!) 103 100 (!) 110   Resp: 20     Temp:    99.8 F (37.7 C)  TempSrc:    Rectal  SpO2: 97% 98% 98%    Eyes: Anicteric no pallor. ENMT: No discharge from the ears eyes nose and mouth. Neck: No mass felt.  No neck rigidity. Respiratory: No rhonchi or crepitations. Cardiovascular: S1-S2  heard. Abdomen: Diffuse tenderness abdomen.  No guarding or rigidity. Musculoskeletal: No edema. Skin: No rash. Neurologic: Alert awake but does not communicate well.  Moving all extremities but has dementia. Psychiatric: Dementia.   Labs on Admission: I have personally reviewed following labs and imaging studies  CBC: Recent Labs  Lab 11/23/21 2222  WBC 9.2  NEUTROABS 8.9*  HGB 14.3  HCT 44.2  MCV 97.1  PLT [id number]*   Basic Metabolic Panel: Recent Labs  Lab 11/23/21 2222  NA 137  K 4.2  CL 102  CO2 18*  GLUCOSE 234*  BUN 28*  CREATININE 1.24*  CALCIUM 8.9   GFR: CrCl cannot be calculated (Unknown ideal weight.). Liver Function Tests: Recent Labs  Lab 11/23/21 2222  AST 54*  ALT 38  ALKPHOS 74  BILITOT 0.9  PROT 6.5  ALBUMIN 3.5   No results for input(s): LIPASE, AMYLASE in the last 168 hours. No results for input(s): AMMONIA in the last 168 hours. Coagulation Profile: No results for input(s): INR, PROTIME in the last 168 hours. Cardiac Enzymes: Recent Labs  Lab 11/23/21 2222  CKTOTAL 65   BNP (last 3 results) No results for input(s): PROBNP in the last 8760 hours. HbA1C: No results for input(s): HGBA1C in the last 72 hours. CBG: Recent Labs  Lab 11/23/21 2220  GLUCAP 239*   Lipid Profile: No results for input(s): CHOL, HDL, LDLCALC, TRIG, CHOLHDL, LDLDIRECT in the last 72 hours. Thyroid Function Tests: No results for input(s): TSH, T4TOTAL, FREET4, T3FREE, THYROIDAB in the last 72 hours. Anemia Panel: No results for input(s): VITAMINB12, FOLATE, FERRITIN, TIBC, IRON, RETICCTPCT in the last 72 hours. Urine analysis:    Component Value Date/Time   COLORURINE YELLOW 11/23/2021 2225   APPEARANCEUR CLEAR 11/23/2021 2225   LABSPEC >1.030 (H) 11/23/2021 2225   PHURINE 6.0 11/23/2021 2225   GLUCOSEU >=500 (A) 11/23/2021 2225   HGBUR TRACE (A) 11/23/2021 2225   BILIRUBINUR NEGATIVE 11/23/2021 2225   KETONESUR NEGATIVE 11/23/2021 2225   PROTEINUR  100 (A) 11/23/2021 2225   UROBILINOGEN 0.2 11/02/2020 2011   NITRITE POSITIVE (A) 11/23/2021 2225   LEUKOCYTESUR TRACE (A) 11/23/2021 2225   Sepsis Labs: @LABRCNTIP (procalcitonin:4,lacticidven:4) ) Recent Results (from the past 240 hour(s))  Resp Panel by RT-PCR (Flu A&B, Covid) Nasopharyngeal Swab     Status: None   Collection Time: 11/23/21 10:40 PM   Specimen: Nasopharyngeal Swab; Nasopharyngeal(NP) swabs in vial transport medium  Result Value Ref Range Status   SARS Coronavirus 2 by RT PCR NEGATIVE NEGATIVE Final    Comment: (NOTE) SARS-CoV-2 target nucleic acids are NOT DETECTED.  The SARS-CoV-2 RNA is generally detectable in upper respiratory specimens during the acute phase of infection. The lowest concentration of SARS-CoV-2 viral copies this assay can detect is 138 copies/mL. A negative result does not preclude SARS-Cov-2 infection and should  not be used as the sole basis for treatment or other patient management decisions. A negative result may occur with  improper specimen collection/handling, submission of specimen other than nasopharyngeal swab, presence of viral mutation(s) within the areas targeted by this assay, and inadequate number of viral copies(<138 copies/mL). A negative result must be combined with clinical observations, patient history, and epidemiological information. The expected result is Negative.  Fact Sheet for Patients:  BloggerCourse.com  Fact Sheet for Healthcare Providers:  SeriousBroker.it  This test is no t yet approved or cleared by the [place] FDA and  has been authorized for detection and/or diagnosis of SARS-CoV-2 by FDA under an Emergency Use Authorization (EUA). This EUA will remain  in effect (meaning this test can be used) for the duration of the COVID-19 declaration under Section 564(b)(1) of the Act, 21 U.S.C.section 360bbb-3(b)(1), unless the authorization is terminated  or  revoked sooner.       Influenza A by PCR NEGATIVE NEGATIVE Final   Influenza B by PCR NEGATIVE NEGATIVE Final    Comment: (NOTE) The Xpert Xpress SARS-CoV-2/FLU/RSV plus assay is intended as an aid in the diagnosis of influenza from Nasopharyngeal swab specimens and should not be used as a sole basis for treatment. Nasal washings and aspirates are unacceptable for Xpert Xpress SARS-CoV-2/FLU/RSV testing.  Fact Sheet for Patients: BloggerCourse.com  Fact Sheet for Healthcare Providers: SeriousBroker.it  This test is not yet approved or cleared by the [place] FDA and has been authorized for detection and/or diagnosis of SARS-CoV-2 by FDA under an Emergency Use Authorization (EUA). This EUA will remain in effect (meaning this test can be used) for the duration of the COVID-19 declaration under Section 564(b)(1) of the Act, 21 U.S.C. section 360bbb-3(b)(1), unless the authorization is terminated or revoked.  Performed at [facility], [address] [place], [place] [address]      Radiological Exams on Admission: CT HEAD WO CONTRAST ( )  Result Date: 11/23/2021 CLINICAL DATA:  Head trauma.  Fall. EXAM: CT HEAD WITHOUT CONTRAST CT CERVICAL SPINE WITHOUT CONTRAST TECHNIQUE: Multidetector CT imaging of the head and cervical spine was performed following the standard protocol without intravenous contrast. Multiplanar CT image reconstructions of the cervical spine were also generated. COMPARISON:  None. FINDINGS: CT HEAD FINDINGS Brain: There is no mass, hemorrhage or extra-axial collection. There is generalized atrophy without lobar predilection. There is hypoattenuation of the periventricular white matter, most commonly indicating chronic ischemic microangiopathy. Vascular: Atherosclerotic calcification of the internal carotid arteries at the skull base. No abnormal hyperdensity of the major intracranial arteries or dural  venous sinuses. Skull: The visualized skull base, calvarium and extracranial soft tissues are normal. Sinuses/Orbits: No fluid levels or advanced mucosal thickening of the visualized paranasal sinuses. No mastoid or middle ear effusion. The orbits are normal. CT CERVICAL SPINE FINDINGS Alignment: Grade 1 anterolisthesis at C3-4 and C4-5. Skull base and vertebrae: No acute fracture. Soft tissues and spinal canal: No prevertebral fluid or swelling. No visible canal hematoma. Disc levels: No advanced spinal canal or neural foraminal stenosis. Upper chest: No pneumothorax, pulmonary nodule or pleural effusion. Other: Unchanged appearance of exophytic right thyroid nodule measuring 1.4 cm. IMPRESSION: 1. Chronic ischemic microangiopathy and generalized atrophy without acute intracranial abnormality. 2. No acute fracture or static subluxation of the cervical spine. 3. 1.4 cm right thyroid nodule, unchanged. Not clinically significant; no follow-up imaging recommended (ref: J Am Coll Radiol. 2015 Feb;12(2): 143-50). Electronically Signed   By: Chrisandra Netters.D.  On: 11/23/2021 23:52   CT Cervical Spine Wo Contrast  Result Date: 11/23/2021 CLINICAL DATA:  Head trauma.  Fall. EXAM: CT HEAD WITHOUT CONTRAST CT CERVICAL SPINE WITHOUT CONTRAST TECHNIQUE: Multidetector CT imaging of the head and cervical spine was performed following the standard protocol without intravenous contrast. Multiplanar CT image reconstructions of the cervical spine were also generated. COMPARISON:  None. FINDINGS: CT HEAD FINDINGS Brain: There is no mass, hemorrhage or extra-axial collection. There is generalized atrophy without lobar predilection. There is hypoattenuation of the periventricular white matter, most commonly indicating chronic ischemic microangiopathy. Vascular: Atherosclerotic calcification of the internal carotid arteries at the skull base. No abnormal hyperdensity of the major intracranial arteries or dural venous sinuses.  Skull: The visualized skull base, calvarium and extracranial soft tissues are normal. Sinuses/Orbits: No fluid levels or advanced mucosal thickening of the visualized paranasal sinuses. No mastoid or middle ear effusion. The orbits are normal. CT CERVICAL SPINE FINDINGS Alignment: Grade 1 anterolisthesis at C3-4 and C4-5. Skull base and vertebrae: No acute fracture. Soft tissues and spinal canal: No prevertebral fluid or swelling. No visible canal hematoma. Disc levels: No advanced spinal canal or neural foraminal stenosis. Upper chest: No pneumothorax, pulmonary nodule or pleural effusion. Other: Unchanged appearance of exophytic right thyroid nodule measuring 1.4 cm. IMPRESSION: 1. Chronic ischemic microangiopathy and generalized atrophy without acute intracranial abnormality. 2. No acute fracture or static subluxation of the cervical spine. 3. 1.4 cm right thyroid nodule, unchanged. Not clinically significant; no follow-up imaging recommended (ref: J Am Coll Radiol. 2015 Feb;12(2): 143-50). Electronically Signed   By: Ulyses Jarred M.D.   On: 11/23/2021 23:52   DG Pelvis Portable  Result Date: 11/24/2021 CLINICAL DATA:  Fall EXAM: PORTABLE CHEST 1 VIEW PORTABLE PELVIS 1 VIEW COMPARISON:  None. FINDINGS: The heart size and mediastinal contours are within normal limits. Both lungs are clear. The visualized skeletal structures are unremarkable. No pelvic fracture. IMPRESSION: No active disease. No pelvic fracture. Electronically Signed   By: Ulyses Jarred M.D.   On: 11/24/2021 00:43   DG Chest Port 1 View  Result Date: 11/24/2021 CLINICAL DATA:  Fall EXAM: PORTABLE CHEST 1 VIEW PORTABLE PELVIS 1 VIEW COMPARISON:  None. FINDINGS: The heart size and mediastinal contours are within normal limits. Both lungs are clear. The visualized skeletal structures are unremarkable. No pelvic fracture. IMPRESSION: No active disease. No pelvic fracture. Electronically Signed   By: Ulyses Jarred M.D.   On: 11/24/2021 00:43     EKG: Independently reviewed.  Sinus tachycardia.  Assessment/Plan Principal Problem:   Sepsis ([place]) Active Problems:   Paroxysmal atrial fibrillation ([facility])    Sepsis likely from urinary tract infection for which patient is on empiric antibiotics fluids follow lactic acid levels.  Follow cultures. Abdominal discomfort on exam could be from pyelonephritis but will check a CT abdomen pelvis. History of paroxysmal atrial fibrillation presently in sinus tachycardia from sepsis.  On metoprolol.  Not on anticoagulation. History of dementia on galantamine. Acute renal failure will be from poor oral intake.  Creatinine increased from 1.5 and it is around 1.2 when compared to August 2021. Chronic thrombocytopenia follow CBC.  Since patient has severe sepsis likely from UTI will need close monitoring and inpatient status.  DVT prophylaxis: Lovenox.  May discontinue if thrombocytes decreased. Code Status: DNR confirmed with patient's son. Family Communication: Patient's son. Disposition Plan: Back to facility when stable. Consults called: None. Admission status: Inpatient.   Rise Patience MD [facility] Pager [phone].  If 7PM-7AM, please contact night-coverage [email or web address] Password [facility]  11/24/2021, 3:49 AM

## 2021-11-24 NOTE — Consult Note (Addendum)
Cardiology Consultation:   Madison Maxwell ID: ELAN MCELVAIN MRN: [id number]; DOB: 06-Dec-1919  Admit date: 11/23/2021 Date of Consult: 11/24/2021  PCP:  Rodrigo Ran, MD   [facility] Providers Cardiologist:  None        Madison Maxwell Profile:   Madison Maxwell is a 87 y.o. female with a hx of PAF no longer on anticoag 2nd fall risk, CVA, HLD, dementia, OA, who is being seen 11/24/2021 for the evaluation of rapid atrial fib, at the request of Dr Hanley Ben.  History of Present Illness:   Madison Maxwell was seen by Dr [place] last in 2019, she was off Xarelto due to fall w/ small ICH. Otherwise doing well.  Pt admitted overnight due to fall and found down on the floor. Pt noted by family to have been lethargic, poor po intake. COVID and flu A negative. T 101.9, +UTI on UA, CXR ok >> admitted for urosepsis.   Lactic acid > 9.0, CK 65, Cr 1.24, Gluc 234, CO2 18  She was initially in sinus rhythm, but when she was being taken downstairs for some CT scans, she went into rapid atrial fibrillation at 7:09 am.  Initially, her heart rate was as high as 180.  Her heart rate has improved a little, but she has had a bolus of amiodarone and is currently in the 120/130s.  Madison Maxwell has been started on IV amiodarone by IM.  Initially, her blood pressure was just above 100, now it is just below 100.  She is asymptomatic with this.  She has been given more than 2 L of IV fluids and a sepsis protocol, but still appears dry.  She responds to verbal stimulus, but is not verbal herself.  She moans at times but no understandable speech.  She withdraws from the exam and covers her self up, exam limited by this.  She does not appear to be in obvious distress.   Past Medical History:  Diagnosis Date   Allergic rhinitis    Anxiety    Duodenal ulcer    External hemorrhoid    Hyperlipidemia    Memory deficit    Osteoarthritis    Osteoporosis    PAF (paroxysmal atrial fibrillation) (HCC)    a. diagnosed in  05/2017. Initially on Xarelto, d/c'd after fall w/ ICH.   SUI (stress urinary incontinence), female    Vitamin D deficiency     Past Surgical History:  Procedure Laterality Date   APPENDECTOMY     VAGINAL HYSTERECTOMY       Home Medications:  Prior to Admission medications   Medication Sig Start Date End Date Taking? Authorizing Provider  cholecalciferol (VITAMIN D) 25 MCG (1000 UNIT) tablet Take 1,000 Units by mouth daily with breakfast.   Yes [provider]  Denosumab (PROLIA [place]) Biannually as needed IM   Yes [provider]  galantamine (RAZADYNE ER) 16 MG 24 hr capsule Take 16 mg by mouth daily with breakfast.  10/29/18  Yes [provider]  lipase/protease/amylase (CREON) 36000 UNITS CPEP capsule Take 36,000-72,000 Units by mouth See admin instructions. Take 2 capsules (72000 units) by mouth with meals and 1 capsule (36000 units) with snacks- total 8 capsules daily. 7am, 11am, 6pm, 9pm   Yes [provider]  metoprolol tartrate (LOPRESSOR) 25 MG tablet Take 12.5 mg by mouth every morning.   Yes [provider]  Multiple Vitamin (MULTIVITAMIN WITH MINERALS) TABS tablet Take 1 tablet by mouth every evening.   Yes [provider]  Omega-3 1000 MG CAPS Take 1,000 mg by mouth daily.   Yes [provider]  Probiotic Product (PROBIOTIC DAILY) CAPS Take 1 capsule by mouth every evening.   Yes [provider]  vitamin B-12 (CYANOCOBALAMIN) 1000 MCG tablet Take 1,000 mcg by mouth every evening.   Yes [provider]  vitamin C (ASCORBIC ACID) 500 MG tablet Take 500 mg by mouth every evening.   Yes [provider]    Inpatient Medications: Scheduled Meds:  enoxaparin (LOVENOX) injection  40 mg Subcutaneous Q24H   galantamine  16 mg Oral Q breakfast   lipase/protease/amylase  72,000 Units Oral TID WC   vitamin B-12  1,000 mcg Oral QPM   Continuous Infusions:  amiodarone 60 mg/hr (11/24/21 0826)    Followed by   amiodarone     cefTRIAXone (ROCEPHIN)  IV Stopped (11/24/21 0029)   lactated ringers 75 mL/hr at 11/24/21 0817   PRN Meds: acetaminophen **OR** acetaminophen, lipase/protease/amylase  Allergies:   No Known Allergies  Social History:   Social History   Socioeconomic History   Marital status: Married    Spouse name: Not on file   Number of children: 2   Years of education: Not on file   Highest education level: Not on file  Occupational History   Occupation: Architectural technologist    Comment: Retired   Tobacco Use   Smoking status: Never   Smokeless tobacco: Never  Scientific laboratory technician Use: Never used  Substance and Sexual Activity   Alcohol use: No   Drug use: No   Sexual activity: Not on file  Other Topics Concern   Not on file  Social History Narrative   Daily caffeine    Social Determinants of Health   Financial Resource Strain: Not on file  Food Insecurity: Not on file  Transportation Needs: Not on file  Physical Activity: Not on file  Stress: Not on file  Social Connections: Not on file  Intimate Partner Violence: Not on file    Family History:   Family History  Problem Relation Age of Onset   Colon polyps Sister    Colon cancer Neg Hx      ROS:  Please see the history of present illness.  All other ROS reviewed and negative.     Physical Exam/Data:   Vitals:   11/24/21 0343 11/24/21 0415 11/24/21 0549 11/24/21 0749  BP:  117/62  102/61  Pulse:  (!) 110  73  Resp:  19  20  Temp: 99.8 F (37.7 C) 98.4 F (36.9 C)  99.1 F (37.3 C)  TempSrc: Rectal Axillary  Oral  SpO2:  96%  97%  Weight:   49.5 kg   Height:   5\' 5"  (1.651 m)     Intake/Output Summary (Last 24 hours) at 11/24/2021 0913 Last data filed at 11/24/2021 0150 Gross per 24 hour  Intake 2101.06 ml  Output --  Net 2101.06 ml   Last 3 Weights 11/24/2021 07/11/2020 07/10/2020  Weight (lbs) 109 lb 2 oz 94 lb 12.8 oz 94 lb 12.8 oz  Weight (kg) 49.5 kg 43 kg 43 kg      Body mass index is 18.16 kg/m.  General: Frail elderly female, in no acute distress HEENT: normal Neck: no JVD Vascular: No carotid bruits; Distal pulses 2+ bilaterally Cardiac:  normal S1, S2; rapid and slightly irregular; no murmur  Lungs:  clear to auscultation anteriorly, no wheezing, rhonchi or rales  Abd: soft, nontender, no hepatomegaly  Ext: no edema Musculoskeletal:  No deformities, BUE and BLE strength weak, but equal Skin: warm and dry  Neuro:  no focal abnormalities noted, Madison Maxwell does not cooperate with exam   EKG:  The EKG was personally reviewed and demonstrates:   12/24 ECG is sinus tachycardia, heart rate 139, LVH, PVC 12/25 ECG is rapid atrial fibrillation, heart rate 158 Telemetry:  Telemetry was personally reviewed and demonstrates: Rapid atrial fibrillation, heart rate generally 120s-130s  Relevant CV Studies:  Echocardiogram: 06/06/2017 Study Conclusions   - Left ventricle: The cavity size was normal. Wall thickness was   increased in a pattern of moderate LVH. Systolic function was   vigorous. The estimated ejection fraction was in the range of 65%   to 70%. Wall motion was normal; there were no regional wall   motion abnormalities. - Aortic valve: Trileaflet; normal thickness, mildly calcified   leaflets. - Mitral valve: There was mild to moderate regurgitation. - Left atrium: The atrium was mildly dilated.  Laboratory Data:  High Sensitivity Troponin:  No results for input(s): TROPONINIHS in the last 720 hours.   Chemistry Recent Labs  Lab 11/23/21 2222 11/24/21 0612  NA 137  --   K 4.2  --   CL 102  --   CO2 18*  --   GLUCOSE 234*  --   BUN 28*  --   CREATININE 1.24* 1.25*  CALCIUM 8.9  --   GFRNONAA 39* 39*  ANIONGAP 17*  --     Recent Labs  Lab 11/23/21 2222  PROT 6.5  ALBUMIN 3.5  AST 54*  ALT 38  ALKPHOS 74  BILITOT 0.9   Lipids No results for input(s): CHOL, TRIG, HDL, LABVLDL, LDLCALC, CHOLHDL in the last 168 hours.   Hematology Recent Labs  Lab 11/23/21 2222 11/24/21 0612  WBC 9.2 12.7*  RBC 4.55 3.83*  HGB 14.3 12.1  HCT 44.2 35.8*  MCV 97.1 93.5  MCH 31.4 31.6  MCHC 32.4 33.8  RDW 12.9 13.0  PLT 147* PLATELET CLUMPS NOTED ON SMEAR, UNABLE TO ESTIMATE   Thyroid No results for input(s): TSH, FREET4 in the last 168 hours.  BNPNo results for input(s): BNP, PROBNP in the last 168 hours.  DDimer No results for input(s): DDIMER in the last 168 hours.   Radiology/Studies:  CT HEAD WO CONTRAST (5MM)  Result Date: 11/23/2021 CLINICAL DATA:  Head trauma.  Fall. EXAM: CT HEAD WITHOUT CONTRAST CT CERVICAL SPINE WITHOUT CONTRAST TECHNIQUE: Multidetector CT imaging of the head and cervical spine was performed following the standard protocol without intravenous contrast. Multiplanar CT image reconstructions of the cervical spine were also generated. COMPARISON:  None. FINDINGS: CT HEAD FINDINGS Brain: There is no mass, hemorrhage or extra-axial collection. There is generalized atrophy without lobar predilection. There is hypoattenuation of the periventricular white matter, most commonly indicating chronic ischemic microangiopathy. Vascular: Atherosclerotic calcification of the internal carotid arteries at the skull base. No abnormal hyperdensity of the major intracranial arteries or dural venous sinuses. Skull: The visualized skull base, calvarium and extracranial soft tissues are normal. Sinuses/Orbits: No fluid levels or advanced mucosal thickening of the visualized paranasal sinuses. No mastoid or middle ear effusion. The orbits are normal. CT CERVICAL SPINE FINDINGS Alignment: Grade 1 anterolisthesis at C3-4 and C4-5. Skull base and vertebrae: No acute fracture. Soft tissues and spinal canal: No prevertebral fluid or swelling. No visible canal hematoma. Disc levels: No advanced spinal canal or neural foraminal stenosis. Upper chest: No pneumothorax, pulmonary nodule or pleural effusion.  Other: Unchanged  appearance of exophytic right thyroid nodule measuring 1.4 cm. IMPRESSION: 1. Chronic ischemic microangiopathy and generalized atrophy without acute intracranial abnormality. 2. No acute fracture or static subluxation of the cervical spine. 3. 1.4 cm right thyroid nodule, unchanged. Not clinically significant; no follow-up imaging recommended (ref: J Am Coll Radiol. 2015 Feb;12(2): 143-50). Electronically Signed   By: Ulyses Jarred M.D.   On: 11/23/2021 23:52   CT Cervical Spine Wo Contrast  Result Date: 11/23/2021 CLINICAL DATA:  Head trauma.  Fall. EXAM: CT HEAD WITHOUT CONTRAST CT CERVICAL SPINE WITHOUT CONTRAST TECHNIQUE: Multidetector CT imaging of the head and cervical spine was performed following the standard protocol without intravenous contrast. Multiplanar CT image reconstructions of the cervical spine were also generated. COMPARISON:  None. FINDINGS: CT HEAD FINDINGS Brain: There is no mass, hemorrhage or extra-axial collection. There is generalized atrophy without lobar predilection. There is hypoattenuation of the periventricular white matter, most commonly indicating chronic ischemic microangiopathy. Vascular: Atherosclerotic calcification of the internal carotid arteries at the skull base. No abnormal hyperdensity of the major intracranial arteries or dural venous sinuses. Skull: The visualized skull base, calvarium and extracranial soft tissues are normal. Sinuses/Orbits: No fluid levels or advanced mucosal thickening of the visualized paranasal sinuses. No mastoid or middle ear effusion. The orbits are normal. CT CERVICAL SPINE FINDINGS Alignment: Grade 1 anterolisthesis at C3-4 and C4-5. Skull base and vertebrae: No acute fracture. Soft tissues and spinal canal: No prevertebral fluid or swelling. No visible canal hematoma. Disc levels: No advanced spinal canal or neural foraminal stenosis. Upper chest: No pneumothorax, pulmonary nodule or pleural effusion. Other: Unchanged appearance of  exophytic right thyroid nodule measuring 1.4 cm. IMPRESSION: 1. Chronic ischemic microangiopathy and generalized atrophy without acute intracranial abnormality. 2. No acute fracture or static subluxation of the cervical spine. 3. 1.4 cm right thyroid nodule, unchanged. Not clinically significant; no follow-up imaging recommended (ref: J Am Coll Radiol. 2015 Feb;12(2): 143-50). Electronically Signed   By: Ulyses Jarred M.D.   On: 11/23/2021 23:52   DG Pelvis Portable  Result Date: 11/24/2021 CLINICAL DATA:  Fall EXAM: PORTABLE CHEST 1 VIEW PORTABLE PELVIS 1 VIEW COMPARISON:  None. FINDINGS: The heart size and mediastinal contours are within normal limits. Both lungs are clear. The visualized skeletal structures are unremarkable. No pelvic fracture. IMPRESSION: No active disease. No pelvic fracture. Electronically Signed   By: Ulyses Jarred M.D.   On: 11/24/2021 00:43   DG Chest Port 1 View  Result Date: 11/24/2021 CLINICAL DATA:  Fall EXAM: PORTABLE CHEST 1 VIEW PORTABLE PELVIS 1 VIEW COMPARISON:  None. FINDINGS: The heart size and mediastinal contours are within normal limits. Both lungs are clear. The visualized skeletal structures are unremarkable. No pelvic fracture. IMPRESSION: No active disease. No pelvic fracture. Electronically Signed   By: Ulyses Jarred M.D.   On: 11/24/2021 00:43     Assessment and Plan:   Rapid atrial fibrillation - Pt went into the rapid Afib at 7:09 am 12/25. - no discernable sx - SBP 100s, so not able to use IV Cardizem or metoprolol - in 2018, pt had hypotension w/ metop 12.5 >> d/c'd - Tamir Wallman give 250 cc fluid bolus since SBP dropped to the 90s after the amiodarone bolus -Continue amiodarone at 60 mg/h, discussed with MD keeping it at 60 mg an hour for 24 hours and possibly cardiovert in a.m. -Add heparin   Risk Assessment/Risk Scores:        CHA2DS2-VASc Score = 5  This indicates a 7.2% annual risk of stroke. The Madison Maxwell's score is based upon: CHF  History: 0 HTN History: 0 Diabetes History: 0 Stroke History: 2 Vascular Disease History: 0 Age Score: 2 Gender Score: 1   For questions or updates, please contact [place] Please consult [email or web address] for contact info under    Signed, Rosaria Ferries, PA-C  11/24/2021 9:13 AM  I have seen and examined this Madison Maxwell with Rosaria Ferries.  Agree with above, note added to reflect my findings.  Madison Maxwell presented to the hospital after being found down on the floor.  She was noted to be lethargic.  Her only positive study was a positive urinalysis.  She was COVID and flu a negative.  She was found to have an elevated lactate of greater than 9.  Creatinine of 1.24.  She is being taken to the CT scanner, but went into rapid atrial fibrillation at 7:09 AM.  She does have a history of atrial fibrillation.  She is not chronically anticoagulated due to fall with intracranial hemorrhage.  Currently she remains lethargic.  GEN: Thin, chronically ill-appearing, lethargic HEENT: normal  Neck: no JVD, carotid bruits, or masses Cardiac: Tachycardic, irregular; no murmurs, rubs, or gallops,no edema  Respiratory:  clear to auscultation bilaterally, normal work of breathing GI: soft, nontender, nondistended, + BS MS: no deformity or atrophy  Skin: warm and dry Neuro:  Strength and sensation are intact Psych: euthymic mood, full affect   Atrial fibrillation with rapid ventricular response: Likely a consequence of her overall illness.  She is not chronically anticoagulated, though she has not had any bleeding recently.  We Jahara Dail start her on IV heparin for now.  She has been put on amiodarone.  This has been successful at somewhat controlling her heart rates.  We Claudio Mondry continue IV amiodarone.  Would hold off on further rate control.  As her overall condition recovers, may be able to add metoprolol.  Kristen Fromm M. Analina Filla MD 11/24/2021 9:16 AM

## 2021-11-24 NOTE — Progress Notes (Signed)
ANTICOAGULATION CONSULT NOTE - Initial Consult  Pharmacy Consult for heparin Indication: atrial fibrillation  No Known Allergies  Patient Measurements: Height: 5\' 5"  (165.1 cm) Weight: 49.5 kg (109 lb 2 oz) IBW/kg (Calculated) : 57 Heparin Dosing Weight: 49.5 kg  Vital Signs: Temp: 99.1 F (37.3 C) (12/25 0749) Temp Source: Oral (12/25 0749) BP: 102/61 (12/25 0749) Pulse Rate: 73 (12/25 0749)  Labs: Recent Labs    11/23/21 2222 11/24/21 0612  HGB 14.3 12.1  HCT 44.2 35.8*  PLT 147* PLATELET CLUMPS NOTED ON SMEAR, UNABLE TO ESTIMATE  CREATININE 1.24* 1.25*  CKTOTAL 65  --     Estimated Creatinine Clearance: 19.2 mL/min (A) (by C-G formula based on SCr of 1.25 mg/dL (H)).   Medical History: Past Medical History:  Diagnosis Date   Allergic rhinitis    Anxiety    Duodenal ulcer    External hemorrhoid    Hyperlipidemia    Memory deficit    Osteoarthritis    Osteoporosis    PAF (paroxysmal atrial fibrillation) (HCC)    a. diagnosed in 05/2017. Initially on Xarelto, d/c'd after fall w/ ICH.   SUI (stress urinary incontinence), female    Vitamin D deficiency     Medications:  Scheduled:   galantamine  16 mg Oral Q breakfast   lipase/protease/amylase  72,000 Units Oral TID WC   vitamin B-12  1,000 mcg Oral QPM   Infusions:   amiodarone 60 mg/hr (11/24/21 0826)   Followed by   amiodarone     cefTRIAXone (ROCEPHIN)  IV Stopped (11/24/21 0029)   heparin     lactated ringers 75 mL/hr at 11/24/21 0817   sodium chloride      Assessment: Patient presented with sepsis, precipitating afib with rapid rate. Patient was previously on Xarelto, which was stopped in 2019 due to multiple falls. Pharmacy is consulted to initiate heparin drip while inpatient. Patient has AKI. CBC stable. Will start heparin at low dose due to age and renal function.  Goal of Therapy:  Heparin level 0.3-0.7 Monitor platelets by anticoagulation protocol: Yes   Plan:  Start heparin at 650  units/hour Stop enoxaparin Check heparin level in 8 hours  Thank you for allowing pharmacy to participate in this patient's care.  2020, PharmD PGY1 Pharmacy Resident 11/24/2021 9:33 AM Check [email or web address] for unit specific pharmacy number

## 2021-11-24 NOTE — Progress Notes (Signed)
PHARMACY - PHYSICIAN COMMUNICATION CRITICAL VALUE ALERT - BLOOD CULTURE IDENTIFICATION (BCID)  Madison Maxwell is an 87 y.o. female who presented to [facility] on 11/23/2021 with a chief complaint of lethargic, found down, UTI  Assessment:   1/4 Blood cultures + GNR-proteus (no resistance) Concern for UTI on admission Improving with current antibiotics   Name of physician (or Provider) Contacted: Dr. Hanley Ben  Current antibiotics: Ceftriaxone  Changes to prescribed antibiotics recommended:  Patient is on recommended antibiotics - No changes needed  Results for orders placed or performed during the hospital encounter of 11/23/21  Blood Culture ID Panel (Reflexed) (Collected: 11/23/2021 10:25 PM)  Result Value Ref Range   Enterococcus faecalis NOT DETECTED NOT DETECTED   Enterococcus Faecium NOT DETECTED NOT DETECTED   Listeria monocytogenes NOT DETECTED NOT DETECTED   Staphylococcus species NOT DETECTED NOT DETECTED   Staphylococcus aureus (BCID) NOT DETECTED NOT DETECTED   Staphylococcus epidermidis NOT DETECTED NOT DETECTED   Staphylococcus lugdunensis NOT DETECTED NOT DETECTED   Streptococcus species NOT DETECTED NOT DETECTED   Streptococcus agalactiae NOT DETECTED NOT DETECTED   Streptococcus pneumoniae NOT DETECTED NOT DETECTED   Streptococcus pyogenes NOT DETECTED NOT DETECTED   A.calcoaceticus-baumannii NOT DETECTED NOT DETECTED   Bacteroides fragilis NOT DETECTED NOT DETECTED   Enterobacterales DETECTED (A) NOT DETECTED   Enterobacter cloacae complex NOT DETECTED NOT DETECTED   Escherichia coli NOT DETECTED NOT DETECTED   Klebsiella aerogenes NOT DETECTED NOT DETECTED   Klebsiella oxytoca NOT DETECTED NOT DETECTED   Klebsiella pneumoniae NOT DETECTED NOT DETECTED   Proteus species DETECTED (A) NOT DETECTED   Salmonella species NOT DETECTED NOT DETECTED   Serratia marcescens NOT DETECTED NOT DETECTED   Haemophilus influenzae NOT DETECTED NOT DETECTED   Neisseria  meningitidis NOT DETECTED NOT DETECTED   Pseudomonas aeruginosa NOT DETECTED NOT DETECTED   Stenotrophomonas maltophilia NOT DETECTED NOT DETECTED   Candida albicans NOT DETECTED NOT DETECTED   Candida auris NOT DETECTED NOT DETECTED   Candida glabrata NOT DETECTED NOT DETECTED   Candida krusei NOT DETECTED NOT DETECTED   Candida parapsilosis NOT DETECTED NOT DETECTED   Candida tropicalis NOT DETECTED NOT DETECTED   Cryptococcus neoformans/gattii NOT DETECTED NOT DETECTED   CTX-M ESBL NOT DETECTED NOT DETECTED   Carbapenem resistance IMP NOT DETECTED NOT DETECTED   Carbapenem resistance KPC NOT DETECTED NOT DETECTED   Carbapenem resistance NDM NOT DETECTED NOT DETECTED   Carbapenem resist OXA 48 LIKE NOT DETECTED NOT DETECTED   Carbapenem resistance VIM NOT DETECTED NOT DETECTED    Thank you for allowing pharmacy to be a part of this patients care.  Tomie China, PharmD Clinical Pharmacist  Please check AMION for all [facility] Pharmacy numbers After 10:00 PM, call Main Pharmacy [phone]

## 2021-11-24 NOTE — Progress Notes (Addendum)
Pt went into A-fib RVR while at CT with HR as high as 180.  12- lead obtained upon returning to unit.  MD notified.  Orders to start Amiodarone drip and cardiology consult.

## 2021-11-25 DIAGNOSIS — R0600 Dyspnea, unspecified: Secondary | ICD-10-CM

## 2021-11-25 DIAGNOSIS — R52 Pain, unspecified: Secondary | ICD-10-CM

## 2021-11-25 DIAGNOSIS — Z66 Do not resuscitate: Secondary | ICD-10-CM

## 2021-11-25 DIAGNOSIS — I480 Paroxysmal atrial fibrillation: Secondary | ICD-10-CM | POA: Diagnosis not present

## 2021-11-25 DIAGNOSIS — A419 Sepsis, unspecified organism: Secondary | ICD-10-CM

## 2021-11-25 DIAGNOSIS — N179 Acute kidney failure, unspecified: Secondary | ICD-10-CM

## 2021-11-25 DIAGNOSIS — N10 Acute tubulo-interstitial nephritis: Secondary | ICD-10-CM

## 2021-11-25 LAB — COMPREHENSIVE METABOLIC PANEL
ALT: 31 U/L (ref 0–44)
AST: 56 U/L — ABNORMAL HIGH (ref 15–41)
Albumin: 2.1 g/dL — ABNORMAL LOW (ref 3.5–5.0)
Alkaline Phosphatase: 75 U/L (ref 38–126)
Anion gap: 12 (ref 5–15)
BUN: 38 mg/dL — ABNORMAL HIGH (ref 8–23)
CO2: 21 mmol/L — ABNORMAL LOW (ref 22–32)
Calcium: 7.8 mg/dL — ABNORMAL LOW (ref 8.9–10.3)
Chloride: 103 mmol/L (ref 98–111)
Creatinine, Ser: 1.31 mg/dL — ABNORMAL HIGH (ref 0.44–1.00)
GFR, Estimated: 37 mL/min — ABNORMAL LOW (ref >60)
Glucose, Bld: 120 mg/dL — ABNORMAL HIGH (ref 70–99)
Potassium: 4.2 mmol/L (ref 3.5–5.1)
Sodium: 136 mmol/L (ref 135–145)
Total Bilirubin: 0.6 mg/dL (ref 0.3–1.2)
Total Protein: 4.7 g/dL — ABNORMAL LOW (ref 6.5–8.1)

## 2021-11-25 LAB — CBC
HCT: 37.1 % (ref 36.0–46.0)
HCT: 37.2 % (ref 36.0–46.0)
Hemoglobin: 12.7 g/dL (ref 12.0–15.0)
Hemoglobin: 13 g/dL (ref 12.0–15.0)
MCH: 31.4 pg (ref 26.0–34.0)
MCH: 31.9 pg (ref 26.0–34.0)
MCHC: 34.2 g/dL (ref 30.0–36.0)
MCHC: 34.9 g/dL (ref 30.0–36.0)
MCV: 91.6 fL (ref 80.0–100.0)
MCV: 91.2 fL (ref 80.0–100.0)
Platelets: 86 10*3/uL — ABNORMAL LOW (ref 150–400)
Platelets: 78 10*3/uL — ABNORMAL LOW (ref 150–400)
RBC: 4.05 MIL/uL (ref 3.87–5.11)
RBC: 4.08 MIL/uL (ref 3.87–5.11)
RDW: 13.3 % (ref 11.5–15.5)
RDW: 13.5 % (ref 11.5–15.5)
WBC: 13.1 10*3/uL — ABNORMAL HIGH (ref 4.0–10.5)
WBC: 12.8 10*3/uL — ABNORMAL HIGH (ref 4.0–10.5)
nRBC: 0 % (ref 0.0–0.2)
nRBC: 0 % (ref 0.0–0.2)

## 2021-11-25 LAB — CREATININE, SERUM
Creatinine, Ser: 1.26 mg/dL — ABNORMAL HIGH (ref 0.44–1.00)
GFR, Estimated: 38 mL/min — ABNORMAL LOW (ref >60)

## 2021-11-25 LAB — HEPARIN LEVEL (UNFRACTIONATED): Heparin Unfractionated: 0.64 IU/mL (ref 0.30–0.70)

## 2021-11-25 LAB — MAGNESIUM: Magnesium: 1.6 mg/dL — ABNORMAL LOW (ref 1.7–2.4)

## 2021-11-25 MED ORDER — MAGNESIUM SULFATE IN D5W 1-5 GM/100ML-% IV SOLN
1.0000 g | Freq: Once | INTRAVENOUS | Status: AC
  Administered 2021-11-25: 06:00:00 1 g via INTRAVENOUS
  Filled 2021-11-25: qty 100

## 2021-11-25 MED ORDER — MORPHINE SULFATE (PF) 2 MG/ML IV SOLN
1.0000 mg | INTRAVENOUS | Status: DC | PRN
  Administered 2021-11-25 – 2021-11-26 (×2): 1 mg via INTRAVENOUS
  Filled 2021-11-25 (×2): qty 1

## 2021-11-25 MED ORDER — LACTATED RINGERS IV SOLN: INTRAVENOUS | Status: AC

## 2021-11-25 MED ORDER — AMIODARONE HCL 200 MG PO TABS: 400.0000 mg | ORAL_TABLET | Freq: Every day | ORAL | Status: DC

## 2021-11-25 MED ORDER — LORAZEPAM 2 MG/ML IJ SOLN
0.5000 mg | INTRAMUSCULAR | Status: DC | PRN
  Administered 2021-11-25: 14:00:00 0.5 mg via INTRAVENOUS
  Filled 2021-11-25: qty 1

## 2021-11-25 MED ORDER — GLYCOPYRROLATE 0.2 MG/ML IJ SOLN: 0.2000 mg | Freq: Four times a day (QID) | INTRAMUSCULAR | Status: DC | PRN

## 2021-11-25 MED ORDER — HEPARIN SODIUM (PORCINE) 5000 UNIT/ML IJ SOLN: 5000.0000 [IU] | Freq: Three times a day (TID) | INTRAMUSCULAR | Status: DC

## 2021-11-25 MED ORDER — MORPHINE SULFATE (PF) 2 MG/ML IV SOLN
1.0000 mg | INTRAVENOUS | Status: DC | PRN
  Administered 2021-11-25: 12:00:00 1 mg via INTRAVENOUS
  Filled 2021-11-25 (×2): qty 1

## 2021-11-25 NOTE — Consult Note (Signed)
Consultation Note  Date: 11/25/2021   Patient Name: Madison Maxwell  DOB: 1919-12-29  MRN: [id number]  Age / Sex: 87 y.o., female  PCP: Crist Infante, MD Referring Physician: Aline August, MD  Reason for Consultation: Establishing goals of care and Psychosocial/spiritual support  HPI/Patient Profile: 87 y.o. female   admitted on 11/23/2021 with history of dementia, paroxysmal atrial fibrillation not on anticoagulation was brought to the ER after patient was found to be on the floor.  Patient lives at [facility] independent living    She was not eating well.  Later when he left the assisted living facility nurse call that he she had a fall.  Patient usually walks with the help of walker to the table in her room.  Otherwise she is requiring complete assist because of her dementia.  Patient's husband died at the beginning of [place]   ED Course: In the ER patient had a fever of 101.9 F tachycardic with lactic acid level initially more than 9 UA is consistent with UTI chest x-ray unremarkable CT head and C-spine were unremarkable.  X-ray pelvis did not show any fracture.   Patient was started on fluid bolus for sepsis following which lactic acid improved and also antibiotics.  Patient admitted for sepsis.  Work-up significant for right ureteral obstruction with associated urosepsis.  Family had in-depth conversation with Dr. Newsome/urology regarding options and family has opted for supportive care approach at this time.    Clinical Assessment and Goals of Care:  This NP Wadie Lessen reviewed medical records, received report from team, assessed the patient and then spoke to patient's son/ Madison Maxwell and daughter/Madison Maxwell  by telephone to discuss diagnosis, prognosis, GOC, EOL wishes, disposition and options.   Concept of Palliative Care was introduced as specialized medical care for people and  their families living with serious illness.  If focuses on providing relief from the symptoms and stress of a serious illness.  The goal is to improve quality of life for both the patient and the family.  Values and goals of care important to patient and family were attempted to be elicited.  Education offered on the natural trajectory and expectations of end-stage dementia, human mortality and adult failure to thrive.  Information offered on the limitations of medical interventions to prolong quality of life when the body fails to thrive.   A  discussion was had today regarding advanced directives.  Concepts specific to code status, artifical feeding and hydration, continued IV antibiotics and rehospitalization was had.    The difference between a aggressive medical intervention path  and a palliative comfort care path for this patient at this time was had.    Both son and daughter verbalizes a sense of knowing that this patient would choose a comfort approach for herself she was able to make decisions for herself at this time.     Both son and daughter verbalize a sense of peace with decision to focus on comfort, quality and dignity allowing a natural death for this  patient at this time.  Education offered on hospice benefit; philosophy and eligibility  both home versus residential.   Natural trajectory and expectations at EOL were discussed.  Questions and concerns addressed.  Family  encouraged to call with questions or concerns.     PMT will continue to support holistically.            Patient son is main contact and Management consultant.     SUMMARY OF RECOMMENDATIONS    Code Status/Advance Care Planning: DNR   Symptom Management:  Pain/dyspnea: Morphine IV every 2 hours. Agitation: Ativan 0.5 mg IV every 4 hours as needed Terminal secretions: Rubinol 0.2 mg every 6 hrs prn    Palliative Prophylaxis:  Eye Care, Frequent Pain Assessment, Oral Care, and Turn  Reposition  Additional Recommendations (Limitations, Scope, Preferences): Full Comfort Care  Psycho-social/Spiritual:  Desire for further Chaplaincy support:no- community church support Additional Recommendations: Education on Hospice  Prognosis:  < 2 weeks  Discharge Planning: Reassess patient in the morning for appropriate transition of care.  Family is interested in residential hospice.     Primary Diagnoses: Present on Admission:  Sepsis (HCC)  Paroxysmal atrial fibrillation (HCC)   I have reviewed the medical record, interviewed the patient and family, and examined the patient. The following aspects are pertinent.  Past Medical History:  Diagnosis Date   Allergic rhinitis    Anxiety    Duodenal ulcer    External hemorrhoid    Hyperlipidemia    Memory deficit    Osteoarthritis    Osteoporosis    PAF (paroxysmal atrial fibrillation) (HCC)    a. diagnosed in 05/2017. Initially on Xarelto, d/c'd after fall w/ [facility].   SUI (stress urinary incontinence), female    Vitamin D deficiency    Social History   Socioeconomic History   Marital status: Married    Spouse name: Not on file   Number of children: 2   Years of education: Not on file   Highest education level: Not on file  Occupational History   Occupation: [employer]    Comment: Retired   Tobacco Use   Smoking status: Never   Smokeless tobacco: Never  Building services engineer Use: Never used  Substance and Sexual Activity   Alcohol use: No   Drug use: No   Sexual activity: Not on file  Other Topics Concern   Not on file  Social History Narrative   Daily caffeine    Social Determinants of Health   Financial Resource Strain: Not on file  Food Insecurity: Not on file  Transportation Needs: Not on file  Physical Activity: Not on file  Stress: Not on file  Social Connections: Not on file   Family History  Problem Relation Age of Onset   Colon polyps Sister    Colon cancer Neg Hx    Scheduled  Meds:  amiodarone  400 mg Oral Daily   galantamine  16 mg Oral Q breakfast   heparin  5,000 Units Subcutaneous Q8H   lipase/protease/amylase  72,000 Units Oral TID WC   vitamin B-12  1,000 mcg Oral QPM   Continuous Infusions:  cefTRIAXone (ROCEPHIN)  IV Stopped (11/24/21 2327)   lactated ringers 75 mL/hr at 11/25/21 0606   PRN Meds:.acetaminophen **OR** acetaminophen, lipase/protease/amylase Medications Prior to Admission:  Prior to Admission medications   Medication Sig Start Date End Date Taking? Authorizing Provider  cholecalciferol (VITAMIN D) 25 MCG (1000 UNIT) tablet Take 1,000 Units by mouth daily with breakfast.  Yes [provider]  Denosumab (PROLIA [place]) Biannually as needed IM   Yes [provider]  galantamine (RAZADYNE ER) 16 MG 24 hr capsule Take 16 mg by mouth daily with breakfast.  10/29/18  Yes [provider]  lipase/protease/amylase (CREON) 36000 UNITS CPEP capsule Take [id number] Units by mouth See admin instructions. Take 2 capsules (72000 units) by mouth with meals and 1 capsule (36000 units) with snacks- total 8 capsules daily. 7am, 11am, 6pm, 9pm   Yes [provider]  metoprolol tartrate (LOPRESSOR) 25 MG tablet Take 12.5 mg by mouth every morning.   Yes [provider]  Multiple Vitamin (MULTIVITAMIN WITH MINERALS) TABS tablet Take 1 tablet by mouth every evening.   Yes [provider]  Omega-3 1000 MG CAPS Take 1,000 mg by mouth daily.   Yes [provider]  Probiotic Product (PROBIOTIC DAILY) CAPS Take 1 capsule by mouth every evening.   Yes [provider]  vitamin B-12 (CYANOCOBALAMIN) 1000 MCG tablet Take 1,000 mcg by mouth every evening.   Yes [provider]  vitamin C (ASCORBIC ACID) 500 MG tablet Take 500 mg by mouth every evening.   Yes [provider]   No Known Allergies Review of Systems  Unable to perform ROS: Dementia   Physical Exam  Vital Signs: BP  116/77    Pulse 73    Temp 98.1 F (36.7 C) (Axillary)    Resp 15    Ht 5\' 5"  (1.651 m)    Wt 50 kg    SpO2 96%    BMI 18.34 kg/m  Pain Scale: 0-10   Pain Score: 0-No pain   SpO2: SpO2: 96 % O2 Device:SpO2: 96 % O2 Flow Rate: .   IO: Intake/output summary:  Intake/Output Summary (Last 24 hours) at 11/25/2021 1012 Last data filed at 11/25/2021 0606 Gross per 24 hour  Intake 1004.95 ml  Output 170 ml  Net 834.95 ml    LBM: Last BM Date:  (UTA/PTA) Baseline Weight: Weight: 49.5 kg Most recent weight: Weight: 50 kg     Palliative Assessment/Data:  20%   Discussed with Dr Starla Link and bedside RN  Time In: 0900 Time Out: 1015  Time Total: 75 minutes Greater than 50%  of this time was spent counseling and coordinating care related to the above assessment and plan.  Signed by: Wadie Lessen, NP   Please contact Palliative Medicine Team phone at [phone] for questions and concerns.  For individual provider: See Shea Evans

## 2021-11-25 NOTE — Progress Notes (Addendum)
Pt with severe R hydronephrosis from ureteral stone most likely.  Urology will round on pt today to discuss wether shes a candidate for intervention.  Ill call son to see if this is something he would even want.  Spoke with son.  He is going to discuss stent vs no stent with his sister and be ready to provide an answer when the urologist calls him in a couple of hours.

## 2021-11-25 NOTE — Progress Notes (Signed)
Progress Note  Patient Name: Madison Maxwell Date of Encounter: 11/25/2021  [facility] Cardiologist: Slayde Brault [place], MD   Subjective   Patient states she is "OK"  Inpatient Medications    Scheduled Meds:  galantamine  16 mg Oral Q breakfast   lipase/protease/amylase  72,000 Units Oral TID WC   vitamin B-12  1,000 mcg Oral QPM   Continuous Infusions:  amiodarone 60 mg/hr (11/25/21 0606)   cefTRIAXone (ROCEPHIN)  IV Stopped (11/24/21 2327)   lactated ringers 75 mL/hr at 11/25/21 0606   PRN Meds: acetaminophen **OR** acetaminophen, lipase/protease/amylase   Vital Signs    Vitals:   11/24/21 1900 11/24/21 2300 11/25/21 0402 11/25/21 0747  BP: (!) 150/48 (!) 143/43 (!) 138/48 133/66  Pulse: 74 75 74 73  Resp: 20 18 16 16   Temp: 99.5 F (37.5 C) 99 F (37.2 C)  98.1 F (36.7 C)  TempSrc: Axillary Axillary  Oral  SpO2: 95% 96% 96% 98%  Weight:   50 kg   Height:        Intake/Output Summary (Last 24 hours) at 11/25/2021 0754 Last data filed at 11/25/2021 0606 Gross per 24 hour  Intake 1004.95 ml  Output 170 ml  Net 834.95 ml   Last 3 Weights 11/25/2021 11/24/2021 07/11/2020  Weight (lbs) 110 lb 3.7 oz 109 lb 2 oz 94 lb 12.8 oz  Weight (kg) 50 kg 49.5 kg 43 kg      Telemetry    NSR. Converted from Afib at 10:40 yesterday am - Personally Reviewed  ECG    Yesterday showed Afib with RVR and diffuse ST depression - Personally Reviewed  Physical Exam   GEN: elderly WF in hand mitts restraint  Neck: No JVD Cardiac: RRR, no murmurs, rubs, or gallops.  Respiratory: Clear to auscultation bilaterally. GI: Soft, nontender, non-distended  MS: No edema; No deformity. Neuro:  Nonfocal    Labs    High Sensitivity Troponin:  No results for input(s): TROPONINIHS in the last 720 hours.   Chemistry Recent Labs  Lab 11/23/21 2222 11/24/21 0612 11/25/21 0345  NA 137  --  136  K 4.2  --  4.2  CL 102  --  103  CO2 18*  --  21*  GLUCOSE 234*  --  120*  BUN  28*  --  38*  CREATININE 1.24* 1.25* 1.31*  CALCIUM 8.9  --  7.8*  MG  --   --  1.6*  PROT 6.5  --  4.7*  ALBUMIN 3.5  --  2.1*  AST 54*  --  56*  ALT 38  --  31  ALKPHOS 74  --  75  BILITOT 0.9  --  0.6  GFRNONAA 39* 39* 37*  ANIONGAP 17*  --  12    Lipids No results for input(s): CHOL, TRIG, HDL, LABVLDL, LDLCALC, CHOLHDL in the last 168 hours.  Hematology Recent Labs  Lab 11/23/21 2222 11/24/21 0612 11/25/21 0345  WBC 9.2 12.7* 13.1*  RBC 4.55 3.83* 4.05  HGB 14.3 12.1 12.7  HCT 44.2 35.8* 37.1  MCV 97.1 93.5 91.6  MCH 31.4 31.6 31.4  MCHC 32.4 33.8 34.2  RDW 12.9 13.0 13.3  PLT 147* PLATELET CLUMPS NOTED ON SMEAR, UNABLE TO ESTIMATE 86*   Thyroid  Recent Labs  Lab 11/23/21 2222  TSH 3.147    BNPNo results for input(s): BNP, PROBNP in the last 168 hours.  DDimer No results for input(s): DDIMER in the last 168 hours.   Radiology  CT ABDOMEN PELVIS WO CONTRAST  Result Date: 11/24/2021 CLINICAL DATA:  Concern for sepsis EXAM: CT ABDOMEN AND PELVIS WITHOUT CONTRAST TECHNIQUE: Multidetector CT imaging of the abdomen and pelvis was performed following the standard protocol without IV contrast. COMPARISON:  None. FINDINGS: Lower chest: Coronary artery and aortic calcifications. Patchy opacities dependently in both lower lobes could reflect atelectasis. Cannot completely exclude early pneumonia in the right lung base. No effusions. Hepatobiliary: Gallstones noted within the gallbladder measuring up to 1.5 cm. No focal hepatic abnormality or biliary ductal dilatation. Pancreas: No focal abnormality or ductal dilatation. Spleen: No focal abnormality.  Normal size. Adrenals/Urinary Tract: Bilateral adrenal fullness, likely hyperplasia. No mass or hydronephrosis on the left. 2 mm nonobstructing stone in the midpole of the right kidney. There is severe hydronephrosis and perinephric stranding. Stranding continues along the ureter which is difficult to follow. Several  calcifications are seen in the right pelvis. Given the difficulty following the right ureter, cannot confirm or exclude ureteral stone. The most suspicious calcification for possible ureteral stone is on image 58 of series 3 measuring 3 mm. Urinary bladder grossly unremarkable. Stomach/Bowel: Moderate stool throughout the colon. No evidence of bowel obstruction. Vascular/Lymphatic: Aortoiliac calcifications diffusely. No evidence of aneurysm or adenopathy. Reproductive: No visible focal abnormality. Other: Small amount of free fluid in the cul-de-sac and right lower quadrant. No free air. Musculoskeletal: No acute bony abnormality. IMPRESSION: Severe right hydronephrosis and perinephric stranding. Stranding continues around the ureter which is difficult to follow. There several calcifications in the right pelvis. The most suspicious calcification is a 3 mm calcification near the pelvic brim, but difficult to confirm this as a ureteral stone. Cholelithiasis. Bibasilar airspace opacities, right greater than left. Favor atelectasis on the left. Right basilar opacity could reflect atelectasis or early infiltrate. Moderate stool burden. Small amount of free fluid in the right lower quadrant and pelvis. Aortoiliac atherosclerosis. Electronically Signed   By: Rolm Baptise M.D.   On: 11/24/2021 07:44   CT HEAD WO CONTRAST (5MM)  Result Date: 11/23/2021 CLINICAL DATA:  Head trauma.  Fall. EXAM: CT HEAD WITHOUT CONTRAST CT CERVICAL SPINE WITHOUT CONTRAST TECHNIQUE: Multidetector CT imaging of the head and cervical spine was performed following the standard protocol without intravenous contrast. Multiplanar CT image reconstructions of the cervical spine were also generated. COMPARISON:  None. FINDINGS: CT HEAD FINDINGS Brain: There is no mass, hemorrhage or extra-axial collection. There is generalized atrophy without lobar predilection. There is hypoattenuation of the periventricular white matter, most commonly indicating  chronic ischemic microangiopathy. Vascular: Atherosclerotic calcification of the internal carotid arteries at the skull base. No abnormal hyperdensity of the major intracranial arteries or dural venous sinuses. Skull: The visualized skull base, calvarium and extracranial soft tissues are normal. Sinuses/Orbits: No fluid levels or advanced mucosal thickening of the visualized paranasal sinuses. No mastoid or middle ear effusion. The orbits are normal. CT CERVICAL SPINE FINDINGS Alignment: Grade 1 anterolisthesis at C3-4 and C4-5. Skull base and vertebrae: No acute fracture. Soft tissues and spinal canal: No prevertebral fluid or swelling. No visible canal hematoma. Disc levels: No advanced spinal canal or neural foraminal stenosis. Upper chest: No pneumothorax, pulmonary nodule or pleural effusion. Other: Unchanged appearance of exophytic right thyroid nodule measuring 1.4 cm. IMPRESSION: 1. Chronic ischemic microangiopathy and generalized atrophy without acute intracranial abnormality. 2. No acute fracture or static subluxation of the cervical spine. 3. 1.4 cm right thyroid nodule, unchanged. Not clinically significant; no follow-up imaging recommended (ref: J Am Coll Radiol. 2015 Feb;12(2):  143-50). Electronically Signed   By: Deatra RobinsonKevin  Herman M.D.   On: 11/23/2021 23:52   CT Cervical Spine Wo Contrast  Result Date: 11/23/2021 CLINICAL DATA:  Head trauma.  Fall. EXAM: CT HEAD WITHOUT CONTRAST CT CERVICAL SPINE WITHOUT CONTRAST TECHNIQUE: Multidetector CT imaging of the head and cervical spine was performed following the standard protocol without intravenous contrast. Multiplanar CT image reconstructions of the cervical spine were also generated. COMPARISON:  None. FINDINGS: CT HEAD FINDINGS Brain: There is no mass, hemorrhage or extra-axial collection. There is generalized atrophy without lobar predilection. There is hypoattenuation of the periventricular white matter, most commonly indicating chronic ischemic  microangiopathy. Vascular: Atherosclerotic calcification of the internal carotid arteries at the skull base. No abnormal hyperdensity of the major intracranial arteries or dural venous sinuses. Skull: The visualized skull base, calvarium and extracranial soft tissues are normal. Sinuses/Orbits: No fluid levels or advanced mucosal thickening of the visualized paranasal sinuses. No mastoid or middle ear effusion. The orbits are normal. CT CERVICAL SPINE FINDINGS Alignment: Grade 1 anterolisthesis at C3-4 and C4-5. Skull base and vertebrae: No acute fracture. Soft tissues and spinal canal: No prevertebral fluid or swelling. No visible canal hematoma. Disc levels: No advanced spinal canal or neural foraminal stenosis. Upper chest: No pneumothorax, pulmonary nodule or pleural effusion. Other: Unchanged appearance of exophytic right thyroid nodule measuring 1.4 cm. IMPRESSION: 1. Chronic ischemic microangiopathy and generalized atrophy without acute intracranial abnormality. 2. No acute fracture or static subluxation of the cervical spine. 3. 1.4 cm right thyroid nodule, unchanged. Not clinically significant; no follow-up imaging recommended (ref: J Am Coll Radiol. 2015 Feb;12(2): 143-50). Electronically Signed   By: Deatra RobinsonKevin  Herman M.D.   On: 11/23/2021 23:52   DG Pelvis Portable  Result Date: 11/24/2021 CLINICAL DATA:  Fall EXAM: PORTABLE CHEST 1 VIEW PORTABLE PELVIS 1 VIEW COMPARISON:  None. FINDINGS: The heart size and mediastinal contours are within normal limits. Both lungs are clear. The visualized skeletal structures are unremarkable. No pelvic fracture. IMPRESSION: No active disease. No pelvic fracture. Electronically Signed   By: Deatra RobinsonKevin  Herman M.D.   On: 11/24/2021 00:43   DG Chest Port 1 View  Result Date: 11/24/2021 CLINICAL DATA:  Fall EXAM: PORTABLE CHEST 1 VIEW PORTABLE PELVIS 1 VIEW COMPARISON:  None. FINDINGS: The heart size and mediastinal contours are within normal limits. Both lungs are clear.  The visualized skeletal structures are unremarkable. No pelvic fracture. IMPRESSION: No active disease. No pelvic fracture. Electronically Signed   By: Deatra RobinsonKevin  Herman M.D.   On: 11/24/2021 00:43    Cardiac Studies   Echo 06/06/17: Study Conclusions   - Left ventricle: The cavity size was normal. Wall thickness was    increased in a pattern of moderate LVH. Systolic function was    vigorous. The estimated ejection fraction was in the range of 65%    to 70%. Wall motion was normal; there were no regional wall    motion abnormalities.  - Aortic valve: Trileaflet; normal thickness, mildly calcified    leaflets.  - Mitral valve: There was mild to moderate regurgitation.  - Left atrium: The atrium was mildly dilated  Patient Profile     87 y.o. female with a hx of PAF no longer on anticoag 2nd fall risk and history of IC bleed,  CVA, HLD, dementia, OA, who is being seen 11/24/2021 for the evaluation of rapid atrial fib, at the request of Dr Hanley BenAlekh.  Assessment & Plan    Rapid atrial fibrillation - Pt went  into the rapid Afib at 7:09 am 12/25 in setting of urosepsis and acidosis - no discernable sx - converted to NSR on IV amiodarone. - in 2018, pt had hypotension w/ metop 12.5 >> d/c'd - will convert amiodarone to po today - not a candidate for long term anticoagulation so will DC IV heparin.  - patient is very elderly with advanced dementia. Continue conservative therapy.       For questions or updates, please contact [place] Please consult [email or web address] for contact info under        Signed, Xhaiden Coombs [place], MD  11/25/2021, 7:54 AM

## 2021-11-25 NOTE — Progress Notes (Addendum)
ANTICOAGULATION CONSULT NOTE  Pharmacy Consult for heparin Indication: atrial fibrillation  No Known Allergies  Patient Measurements: Height: 5\' 5"  (165.1 cm) Weight: 50 kg (110 lb 3.7 oz) IBW/kg (Calculated) : 57 Heparin Dosing Weight: 49.5 kg  Vital Signs: Temp: 99 F (37.2 C) (12/25 2300) Temp Source: Axillary (12/25 2300) BP: 138/48 (12/26 0402) Pulse Rate: 74 (12/26 0402)  Labs: Recent Labs    11/23/21 2222 11/24/21 0612 11/24/21 1800 11/25/21 0345  HGB 14.3 12.1  --   --   HCT 44.2 35.8*  --   --   PLT 147* PLATELET CLUMPS NOTED ON SMEAR, UNABLE TO ESTIMATE  --   --   HEPARINUNFRC  --   --  <0.10* 0.64  CREATININE 1.24* 1.25*  --  1.31*  CKTOTAL 65  --   --   --      Estimated Creatinine Clearance: 18.5 mL/min (A) (by C-G formula based on SCr of 1.31 mg/dL (H)).   Assessment: Patient presented with sepsis, precipitating afib with rapid rate. Patient was previously on Xarelto, which was stopped in 2019 due to multiple falls. Pharmacy is consulted to initiate heparin drip while inpatient.   Heparin level therapeutic (0.64) on gtt at 800 units/hr. No bleeding noted.  Goal of Therapy:  Heparin level 0.3-0.7 Monitor platelets by anticoagulation protocol: Yes   Plan:  Continue heparin rate at 800 units/hour Check heparin level in 6 hours to confirm therapeutic  2020, PharmD, BCPS Please see amion for complete clinical pharmacist phone list 11/25/2021, 5:20 AM  Addendum [phone]): Dr. ([id number] is holding the heparin in case pt going to urologic procedure today. Plt also back at 86.  Julian Reil, PharmD, BCPS Please see amion for complete clinical pharmacist phone list 11/25/2021 5:47 AM

## 2021-11-25 NOTE — Progress Notes (Addendum)
Patient ID: Madison Maxwell, female   DOB: [birth year], 87 y.o.   MRN: [id number]  PROGRESS NOTE    Madison Maxwell  [id number] DOB: 1920/11/04 DOA: 11/23/2021 PCP: Crist Infante, MD   Brief Narrative:  87 y.o. female with history of dementia, paroxysmal atrial fibrillation not on anticoagulation presented from assisted living facility after she was found to be on the floor lethargic.  On presentation, she had a temperature of 101.9 with tachycardia and lactic acidosis.  UA was consistent with UTI.  Chest x-ray was unremarkable.  CT of the head and cervical spine was unremarkable.  X-ray of pelvis did not show any fracture.  She was started on IV fluids and antibiotics.  Assessment & Plan:   Severe sepsis: Present on admission; probably from below Acute right-sided pyelonephritis with severe right hydronephrosis and possible right ureterolithiasis Proteus bacteremia -Presented with fever, tachycardia, lactic acidosis, acute metabolic encephalopathy and UA consistent with UTI.  CT of the abdomen showed findings as above.  Currently on IV fluids and Rocephin.  Follow sensitivities of Proteus in the blood cultures. -Urology consultation appreciated: Family has opted for conservative management and possible route for comfort measures.  Will consult palliative care.  Leukocytosis -Improving  Thrombocytopenia -Questionable cause.  No signs of bleeding.  Paroxysmal A. fib with RVR -Heart rate went up to the 180s on 11/24/2021; amiodarone IV drip started to get cardiology consulted.  Cardiology started the patient on heparin drip as well.  Subsequently, cardiology has DC'd amiodarone drip and heparin drip and started the patient on oral amiodarone.  Poor candidate for anticoagulation.  Metoprolol on hold for now. -Currently rate controlled.  History of dementia -Currently on galantamine.  Follow palliative care consult recommendations.  If condition were to worsen, recommend total comfort  measures  Acute kidney injury has been ruled out -Creatinine currently around baseline.    DVT prophylaxis: DC heparin because of thrombocytopenia.  We will start SCDs Code Status: DNR Family Communication: Spoke to son on phone on 11/24/2021. Called son again on 11/25/2021 on phone but he didn't pick up Disposition Plan: Status is: Inpatient  Remains inpatient appropriate because: Of severity of illness  Consultants: Cardiology/urology/palliative care  Procedures: None  Antimicrobials: Rocephin from 11/24/2021 onwards   Subjective: Patient seen and examined at bedside.  Extremely poor historian.  Confused.  Intermittently moaning.  No overnight fever, vomiting reported.  Objective: Vitals:   11/24/21 2300 11/25/21 0402 11/25/21 0747 11/25/21 0800  BP: (!) 143/43 (!) 138/48 133/66 116/77  Pulse: 75 74 73 73  Resp: 18 16 16 15   Temp: 99 F (37.2 C)  98.1 F (36.7 C) 98.1 F (36.7 C)  TempSrc: Axillary  Oral Axillary  SpO2: 96% 96% 98% 96%  Weight:  50 kg    Height:        Intake/Output Summary (Last 24 hours) at 11/25/2021 1018 Last data filed at 11/25/2021 0606 Gross per 24 hour  Intake 1004.95 ml  Output 170 ml  Net 834.95 ml   Filed Weights   11/24/21 0549 11/25/21 0402  Weight: 49.5 kg 50 kg    Examination:  General exam: Looks chronically ill and deconditioned.  Currently on room air.  Elderly female lying in bed.  No distress Respiratory system: Bilateral decreased breath sounds at bases with some scattered crackles Cardiovascular system: S1 & S2 heard; currently rate controlled mildly tender in the lower quadrant.  Gastrointestinal system: Abdomen is nondistended, soft and nontender. Normal bowel sounds heard. Extremities: No cyanosis,  clubbing; trace lower extremity edema Central nervous system: Awake; extremely slow to respond; confused.  No focal neurological deficits. Moving extremities Skin: No rashes, lesions or ulcers Psychiatry: Could not be  assessed because of mental status.  Intermittently moaning.   Data Reviewed: I have personally reviewed following labs and imaging studies  CBC: Recent Labs  Lab 11/23/21 2222 11/24/21 0612 11/25/21 0345 11/25/21 0820  WBC 9.2 12.7* 13.1* 12.8*  NEUTROABS 8.9*  --   --   --   HGB 14.3 12.1 12.7 13.0  HCT 44.2 35.8* 37.1 37.2  MCV 97.1 93.5 91.6 91.2  PLT 147* PLATELET CLUMPS NOTED ON SMEAR, UNABLE TO ESTIMATE 86* 78*   Basic Metabolic Panel: Recent Labs  Lab 11/23/21 2222 11/24/21 0612 11/25/21 0345 11/25/21 0820  NA 137  --  136  --   K 4.2  --  4.2  --   CL 102  --  103  --   CO2 18*  --  21*  --   GLUCOSE 234*  --  120*  --   BUN 28*  --  38*  --   CREATININE 1.24* 1.25* 1.31* 1.26*  CALCIUM 8.9  --  7.8*  --   MG  --   --  1.6*  --    GFR: Estimated Creatinine Clearance: 19.2 mL/min (A) (by C-G formula based on SCr of 1.26 mg/dL (H)). Liver Function Tests: Recent Labs  Lab 11/23/21 2222 11/25/21 0345  AST 54* 56*  ALT 38 31  ALKPHOS 74 75  BILITOT 0.9 0.6  PROT 6.5 4.7*  ALBUMIN 3.5 2.1*   No results for input(s): LIPASE, AMYLASE in the last 168 hours. No results for input(s): AMMONIA in the last 168 hours. Coagulation Profile: No results for input(s): INR, PROTIME in the last 168 hours. Cardiac Enzymes: Recent Labs  Lab 11/23/21 2222  CKTOTAL 65   BNP (last 3 results) No results for input(s): PROBNP in the last 8760 hours. HbA1C: Recent Labs    11/24/21 0612  HGBA1C 5.6   CBG: Recent Labs  Lab 11/23/21 2220 11/24/21 1128 11/24/21 1656  GLUCAP 239* 130* 154*   Lipid Profile: No results for input(s): CHOL, HDL, LDLCALC, TRIG, CHOLHDL, LDLDIRECT in the last 72 hours. Thyroid Function Tests: Recent Labs    11/23/21 2222  TSH 3.147   Anemia Panel: No results for input(s): VITAMINB12, FOLATE, FERRITIN, TIBC, IRON, RETICCTPCT in the last 72 hours. Sepsis Labs: Recent Labs  Lab 11/23/21 2222 11/23/21 2345  LATICACIDVEN >9.0* 8.3*     Recent Results (from the past 240 hour(s))  Blood Culture (routine x 2)     Status: Abnormal (Preliminary result)   Collection Time: 11/23/21 10:25 PM   Specimen: BLOOD  Result Value Ref Range Status   Specimen Description BLOOD SITE NOT SPECIFIED  Final   Special Requests   Final    BOTTLES DRAWN AEROBIC AND ANAEROBIC Blood Culture adequate volume   Culture  Setup Time   Final    GRAM NEGATIVE RODS IN BOTH AEROBIC AND ANAEROBIC BOTTLES CRITICAL RESULT CALLED TO, READ BACK BY AND VERIFIED WITH: E,BREWINGTON PHARMD @0900  11/24/21 EB    Culture (A)  Final    PROTEUS MIRABILIS SUSCEPTIBILITIES TO FOLLOW Performed at [facility], [place] [address], [place], [place] [address]    Report Status PENDING  Incomplete  Urine Culture     Status: Abnormal (Preliminary result)   Collection Time: 11/23/21 10:25 PM   Specimen: In/Out Cath Urine  Result Value Ref Range Status   Specimen Description IN/OUT CATH URINE  Final   Special Requests   Final    NONE Performed at [facility], [place] [address] [place], [place] [address]    Culture >=100,000 COLONIES/mL GRAM NEGATIVE RODS (A)  Final   Report Status PENDING  Incomplete  Blood Culture ID Panel (Reflexed)     Status: Abnormal   Collection Time: 11/23/21 10:25 PM  Result Value Ref Range Status   Enterococcus faecalis NOT DETECTED NOT DETECTED Final   Enterococcus Faecium NOT DETECTED NOT DETECTED Final   Listeria monocytogenes NOT DETECTED NOT DETECTED Final   Staphylococcus species NOT DETECTED NOT DETECTED Final   Staphylococcus aureus (BCID) NOT DETECTED NOT DETECTED Final   Staphylococcus epidermidis NOT DETECTED NOT DETECTED Final   Staphylococcus lugdunensis NOT DETECTED NOT DETECTED Final   Streptococcus species NOT DETECTED NOT DETECTED Final   Streptococcus agalactiae NOT DETECTED NOT DETECTED Final   Streptococcus pneumoniae NOT DETECTED NOT DETECTED Final   Streptococcus pyogenes NOT DETECTED NOT DETECTED Final    A.calcoaceticus-baumannii NOT DETECTED NOT DETECTED Final   Bacteroides fragilis NOT DETECTED NOT DETECTED Final   Enterobacterales DETECTED (A) NOT DETECTED Final    Comment: Enterobacterales represent a large order of gram negative bacteria, not a single organism. CRITICAL RESULT CALLED TO, READ BACK BY AND VERIFIED WITH: E,BREWINGTON PHARMD @0900  11/24/21 EB    Enterobacter cloacae complex NOT DETECTED NOT DETECTED Final   Escherichia coli NOT DETECTED NOT DETECTED Final   Klebsiella aerogenes NOT DETECTED NOT DETECTED Final   Klebsiella oxytoca NOT DETECTED NOT DETECTED Final   Klebsiella pneumoniae NOT DETECTED NOT DETECTED Final   Proteus species DETECTED (A) NOT DETECTED Final    Comment: CRITICAL RESULT CALLED TO, READ BACK BY AND VERIFIED WITH: E,BREWINGTON PHARMD @0900  11/24/21 EB    Salmonella species NOT DETECTED NOT DETECTED Final   Serratia marcescens NOT DETECTED NOT DETECTED Final   Haemophilus influenzae NOT DETECTED NOT DETECTED Final   Neisseria meningitidis NOT DETECTED NOT DETECTED Final   Pseudomonas aeruginosa NOT DETECTED NOT DETECTED Final   Stenotrophomonas maltophilia NOT DETECTED NOT DETECTED Final   Candida albicans NOT DETECTED NOT DETECTED Final   Candida auris NOT DETECTED NOT DETECTED Final   Candida glabrata NOT DETECTED NOT DETECTED Final   Candida krusei NOT DETECTED NOT DETECTED Final   Candida parapsilosis NOT DETECTED NOT DETECTED Final   Candida tropicalis NOT DETECTED NOT DETECTED Final   Cryptococcus neoformans/gattii NOT DETECTED NOT DETECTED Final   CTX-M ESBL NOT DETECTED NOT DETECTED Final   Carbapenem resistance IMP NOT DETECTED NOT DETECTED Final   Carbapenem resistance KPC NOT DETECTED NOT DETECTED Final   Carbapenem resistance NDM NOT DETECTED NOT DETECTED Final   Carbapenem resist OXA 48 LIKE NOT DETECTED NOT DETECTED Final   Carbapenem resistance VIM NOT DETECTED NOT DETECTED Final    Comment: Performed at [facility], [address] [place], [place] [address]  Blood Culture (routine x 2)     Status: Abnormal (Preliminary result)   Collection Time: 11/23/21 10:30 PM   Specimen: BLOOD  Result Value Ref Range Status   Specimen Description BLOOD SITE NOT SPECIFIED  Final   Special Requests   Final    BOTTLES DRAWN AEROBIC AND ANAEROBIC Blood Culture adequate volume   Culture  Setup Time   Final    GRAM NEGATIVE RODS IN BOTH AEROBIC AND ANAEROBIC BOTTLES CRITICAL VALUE NOTED.  VALUE IS CONSISTENT  WITH PREVIOUSLY REPORTED AND CALLED VALUE. Performed at [facility], [address], [place], [place] [address]    Culture PROTEUS MIRABILIS (A)  Final   Report Status PENDING  Incomplete  Resp Panel by RT-PCR (Flu A&B, Covid) Nasopharyngeal Swab     Status: None   Collection Time: 11/23/21 10:40 PM   Specimen: Nasopharyngeal Swab; Nasopharyngeal(NP) swabs in vial transport medium  Result Value Ref Range Status   SARS Coronavirus 2 by RT PCR NEGATIVE NEGATIVE Final    Comment: (NOTE) SARS-CoV-2 target nucleic acids are NOT DETECTED.  The SARS-CoV-2 RNA is generally detectable in upper respiratory specimens during the acute phase of infection. The lowest concentration of SARS-CoV-2 viral copies this assay can detect is 138 copies/mL. A negative result does not preclude SARS-Cov-2 infection and should not be used as the sole basis for treatment or other patient management decisions. A negative result may occur with  improper specimen collection/handling, submission of specimen other than nasopharyngeal swab, presence of viral mutation(s) within the areas targeted by this assay, and inadequate number of viral copies(<138 copies/mL). A negative result must be combined with clinical observations, patient history, and epidemiological information. The expected result is Negative.  Fact Sheet for Patients:  BloggerCourse.com  Fact Sheet for Healthcare Providers:   SeriousBroker.it  This test is no t yet approved or cleared by the [place] FDA and  has been authorized for detection and/or diagnosis of SARS-CoV-2 by FDA under an Emergency Use Authorization (EUA). This EUA will remain  in effect (meaning this test can be used) for the duration of the COVID-19 declaration under Section 564(b)(1) of the Act, 21 U.S.C.section 360bbb-3(b)(1), unless the authorization is terminated  or revoked sooner.       Influenza A by PCR NEGATIVE NEGATIVE Final   Influenza B by PCR NEGATIVE NEGATIVE Final    Comment: (NOTE) The Xpert Xpress SARS-CoV-2/FLU/RSV plus assay is intended as an aid in the diagnosis of influenza from Nasopharyngeal swab specimens and should not be used as a sole basis for treatment. Nasal washings and aspirates are unacceptable for Xpert Xpress SARS-CoV-2/FLU/RSV testing.  Fact Sheet for Patients: BloggerCourse.com  Fact Sheet for Healthcare Providers: SeriousBroker.it  This test is not yet approved or cleared by the [place] FDA and has been authorized for detection and/or diagnosis of SARS-CoV-2 by FDA under an Emergency Use Authorization (EUA). This EUA will remain in effect (meaning this test can be used) for the duration of the COVID-19 declaration under Section 564(b)(1) of the Act, 21 U.S.C. section 360bbb-3(b)(1), unless the authorization is terminated or revoked.  Performed at [facility], [address], [place], [place] [address]          Radiology Studies: CT ABDOMEN PELVIS WO CONTRAST  Result Date: 11/24/2021 CLINICAL DATA:  Concern for sepsis EXAM: CT ABDOMEN AND PELVIS WITHOUT CONTRAST TECHNIQUE: Multidetector CT imaging of the abdomen and pelvis was performed following the standard protocol without IV contrast. COMPARISON:  None. FINDINGS: Lower chest: Coronary artery and aortic calcifications. Patchy opacities  dependently in both lower lobes could reflect atelectasis. Cannot completely exclude early pneumonia in the right lung base. No effusions. Hepatobiliary: Gallstones noted within the gallbladder measuring up to 1.5 cm. No focal hepatic abnormality or biliary ductal dilatation. Pancreas: No focal abnormality or ductal dilatation. Spleen: No focal abnormality.  Normal size. Adrenals/Urinary Tract: Bilateral adrenal fullness, likely hyperplasia. No mass or hydronephrosis on the left. 2 mm nonobstructing stone in the midpole of the  right kidney. There is severe hydronephrosis and perinephric stranding. Stranding continues along the ureter which is difficult to follow. Several calcifications are seen in the right pelvis. Given the difficulty following the right ureter, cannot confirm or exclude ureteral stone. The most suspicious calcification for possible ureteral stone is on image 58 of series 3 measuring 3 mm. Urinary bladder grossly unremarkable. Stomach/Bowel: Moderate stool throughout the colon. No evidence of bowel obstruction. Vascular/Lymphatic: Aortoiliac calcifications diffusely. No evidence of aneurysm or adenopathy. Reproductive: No visible focal abnormality. Other: Small amount of free fluid in the cul-de-sac and right lower quadrant. No free air. Musculoskeletal: No acute bony abnormality. IMPRESSION: Severe right hydronephrosis and perinephric stranding. Stranding continues around the ureter which is difficult to follow. There several calcifications in the right pelvis. The most suspicious calcification is a 3 mm calcification near the pelvic brim, but difficult to confirm this as a ureteral stone. Cholelithiasis. Bibasilar airspace opacities, right greater than left. Favor atelectasis on the left. Right basilar opacity could reflect atelectasis or early infiltrate. Moderate stool burden. Small amount of free fluid in the right lower quadrant and pelvis. Aortoiliac atherosclerosis. Electronically Signed    By: Rolm Baptise M.D.   On: 11/24/2021 07:44   CT HEAD WO CONTRAST (5MM)  Result Date: 11/23/2021 CLINICAL DATA:  Head trauma.  Fall. EXAM: CT HEAD WITHOUT CONTRAST CT CERVICAL SPINE WITHOUT CONTRAST TECHNIQUE: Multidetector CT imaging of the head and cervical spine was performed following the standard protocol without intravenous contrast. Multiplanar CT image reconstructions of the cervical spine were also generated. COMPARISON:  None. FINDINGS: CT HEAD FINDINGS Brain: There is no mass, hemorrhage or extra-axial collection. There is generalized atrophy without lobar predilection. There is hypoattenuation of the periventricular white matter, most commonly indicating chronic ischemic microangiopathy. Vascular: Atherosclerotic calcification of the internal carotid arteries at the skull base. No abnormal hyperdensity of the major intracranial arteries or dural venous sinuses. Skull: The visualized skull base, calvarium and extracranial soft tissues are normal. Sinuses/Orbits: No fluid levels or advanced mucosal thickening of the visualized paranasal sinuses. No mastoid or middle ear effusion. The orbits are normal. CT CERVICAL SPINE FINDINGS Alignment: Grade 1 anterolisthesis at C3-4 and C4-5. Skull base and vertebrae: No acute fracture. Soft tissues and spinal canal: No prevertebral fluid or swelling. No visible canal hematoma. Disc levels: No advanced spinal canal or neural foraminal stenosis. Upper chest: No pneumothorax, pulmonary nodule or pleural effusion. Other: Unchanged appearance of exophytic right thyroid nodule measuring 1.4 cm. IMPRESSION: 1. Chronic ischemic microangiopathy and generalized atrophy without acute intracranial abnormality. 2. No acute fracture or static subluxation of the cervical spine. 3. 1.4 cm right thyroid nodule, unchanged. Not clinically significant; no follow-up imaging recommended (ref: J Am Coll Radiol. 2015 Feb;12(2): 143-50). Electronically Signed   By: Ulyses Jarred M.D.    On: 11/23/2021 23:52   CT Cervical Spine Wo Contrast  Result Date: 11/23/2021 CLINICAL DATA:  Head trauma.  Fall. EXAM: CT HEAD WITHOUT CONTRAST CT CERVICAL SPINE WITHOUT CONTRAST TECHNIQUE: Multidetector CT imaging of the head and cervical spine was performed following the standard protocol without intravenous contrast. Multiplanar CT image reconstructions of the cervical spine were also generated. COMPARISON:  None. FINDINGS: CT HEAD FINDINGS Brain: There is no mass, hemorrhage or extra-axial collection. There is generalized atrophy without lobar predilection. There is hypoattenuation of the periventricular white matter, most commonly indicating chronic ischemic microangiopathy. Vascular: Atherosclerotic calcification of the internal carotid arteries at the skull base. No abnormal hyperdensity of the major intracranial  arteries or dural venous sinuses. Skull: The visualized skull base, calvarium and extracranial soft tissues are normal. Sinuses/Orbits: No fluid levels or advanced mucosal thickening of the visualized paranasal sinuses. No mastoid or middle ear effusion. The orbits are normal. CT CERVICAL SPINE FINDINGS Alignment: Grade 1 anterolisthesis at C3-4 and C4-5. Skull base and vertebrae: No acute fracture. Soft tissues and spinal canal: No prevertebral fluid or swelling. No visible canal hematoma. Disc levels: No advanced spinal canal or neural foraminal stenosis. Upper chest: No pneumothorax, pulmonary nodule or pleural effusion. Other: Unchanged appearance of exophytic right thyroid nodule measuring 1.4 cm. IMPRESSION: 1. Chronic ischemic microangiopathy and generalized atrophy without acute intracranial abnormality. 2. No acute fracture or static subluxation of the cervical spine. 3. 1.4 cm right thyroid nodule, unchanged. Not clinically significant; no follow-up imaging recommended (ref: J Am Coll Radiol. 2015 Feb;12(2): 143-50). Electronically Signed   By: Ulyses Jarred M.D.   On: 11/23/2021  23:52   DG Pelvis Portable  Result Date: 11/24/2021 CLINICAL DATA:  Fall EXAM: PORTABLE CHEST 1 VIEW PORTABLE PELVIS 1 VIEW COMPARISON:  None. FINDINGS: The heart size and mediastinal contours are within normal limits. Both lungs are clear. The visualized skeletal structures are unremarkable. No pelvic fracture. IMPRESSION: No active disease. No pelvic fracture. Electronically Signed   By: Ulyses Jarred M.D.   On: 11/24/2021 00:43   DG Chest Port 1 View  Result Date: 11/24/2021 CLINICAL DATA:  Fall EXAM: PORTABLE CHEST 1 VIEW PORTABLE PELVIS 1 VIEW COMPARISON:  None. FINDINGS: The heart size and mediastinal contours are within normal limits. Both lungs are clear. The visualized skeletal structures are unremarkable. No pelvic fracture. IMPRESSION: No active disease. No pelvic fracture. Electronically Signed   By: Ulyses Jarred M.D.   On: 11/24/2021 00:43        Scheduled Meds:  amiodarone  400 mg Oral Daily   galantamine  16 mg Oral Q breakfast   heparin  5,000 Units Subcutaneous Q8H   lipase/protease/amylase  72,000 Units Oral TID WC   vitamin B-12  1,000 mcg Oral QPM   Continuous Infusions:  cefTRIAXone (ROCEPHIN)  IV Stopped (11/24/21 2327)   lactated ringers 75 mL/hr at 11/25/21 0606          Aline August, MD [facility] 11/25/2021, 10:18 AM

## 2021-11-25 NOTE — Progress Notes (Addendum)
The patient only had a recorded urinary output of 170 mL during day shift on December 25th. At 2315, the nurse bladder scanned the patient and only got 84 mL in the bladder.  The patient still has not urinated since 1800. The Lactated Ringer's infusion was stopped at 0419 and a bladder scan was redone.  This time the highest volume found was 184 mL in the bladder.  BUN is 38 and creatinine is 1.31 this morning. Will continue to monitor.  Harriet Masson, RN

## 2021-11-25 NOTE — Consult Note (Addendum)
Urology Consult   Physician requesting consult: Dr Remi Haggard  Reason for consult: 1.  Hydronephrosis with associated urosepsis  History of Present Illness: Madison Maxwell is a 87 y.o. white female with history of end-stage dementia.  Was admitted from nursing facility with fever and abdominal discomfort.  Maxwell has been evaluated with CT scan which shows significant hydronephrosis on Madison right side with perinephric stranding with unclear obstruction site but suggestion of possible stone in Madison mid ureter.  Maxwell is growing Proteus on blood cultures.  I was asked for assistance with evaluation and management of hydronephrosis associated with sepsis.  She denies a history of voiding or storage urinary symptoms, hematuria, UTIs, STDs, urolithiasis, GU malignancy/trauma/surgery.  Past Medical History:  Diagnosis Date   Allergic rhinitis    Anxiety    Duodenal ulcer    External hemorrhoid    Hyperlipidemia    Memory deficit    Osteoarthritis    Osteoporosis    PAF (paroxysmal atrial fibrillation) ([place])    a. diagnosed in 05/2017. Initially on Xarelto, d/c'd after fall w/ ICH.   SUI (stress urinary incontinence), female    Vitamin D deficiency     Past Surgical History:  Procedure Laterality Date   APPENDECTOMY     VAGINAL HYSTERECTOMY       Current Hospital Medications:  Home meds:  No current facility-administered medications on file prior to encounter.   Current Outpatient Medications on File Prior to Encounter  Medication Sig Dispense Refill   cholecalciferol (VITAMIN D) 25 MCG (1000 UNIT) tablet Take 1,000 Units by mouth daily with breakfast.     Denosumab (PROLIA [place]) Biannually as needed IM     galantamine (RAZADYNE ER) 16 MG 24 hr capsule Take 16 mg by mouth daily with breakfast.      lipase/protease/amylase (CREON) 36000 UNITS CPEP capsule Take 36,000-72,000 Units by mouth See admin instructions. Take 2 capsules (72000 units) by mouth with meals and 1 capsule (36000  units) with snacks- total 8 capsules daily. 7am, 11am, 6pm, 9pm     metoprolol tartrate (LOPRESSOR) 25 MG tablet Take 12.5 mg by mouth every morning.     Multiple Vitamin (MULTIVITAMIN WITH MINERALS) TABS tablet Take 1 tablet by mouth every evening.     Omega-3 1000 MG CAPS Take 1,000 mg by mouth daily.     Probiotic Product (PROBIOTIC DAILY) CAPS Take 1 capsule by mouth every evening.     vitamin B-12 (CYANOCOBALAMIN) 1000 MCG tablet Take 1,000 mcg by mouth every evening.     vitamin C (ASCORBIC ACID) 500 MG tablet Take 500 mg by mouth every evening.       Scheduled Meds:  amiodarone  400 mg Oral Daily   galantamine  16 mg Oral Q breakfast   heparin  5,000 Units Subcutaneous Q8H   lipase/protease/amylase  72,000 Units Oral TID WC   vitamin B-12  1,000 mcg Oral QPM   Continuous Infusions:  cefTRIAXone (ROCEPHIN)  IV Stopped (11/24/21 2327)   lactated ringers 75 mL/hr at 11/25/21 0606   PRN Meds:.acetaminophen **OR** acetaminophen, lipase/protease/amylase  Allergies: No Known Allergies  Family History  Problem Relation Age of Onset   Colon polyps Sister    Colon cancer Neg Hx     Social History:  reports that she has never smoked. She has never used smokeless tobacco. She reports that she does not drink alcohol and does not use drugs.  ROS: A complete review of systems was performed.  All systems are negative except  for pertinent findings as noted.  Physical Exam:  Vital signs in last 24 hours: Temp:  [98.1 F (36.7 C)-99.5 F (37.5 C)] 98.1 F (36.7 C) (12/26 0747) Pulse Rate:  [73-110] 73 (12/26 0747) Resp:  [16-28] 16 (12/26 0747) BP: (112-150)/(43-70) 133/66 (12/26 0747) SpO2:  [95 %-98 %] 98 % (12/26 0747) Weight:  [50 kg] 50 kg (12/26 0402)  Laboratory Data:  Recent Labs    11/23/21 2222 11/24/21 0612 11/25/21 0345 11/25/21 0820  WBC 9.2 12.7* 13.1* 12.8*  HGB 14.3 12.1 12.7 13.0  HCT 44.2 35.8* 37.1 37.2  PLT 147* PLATELET CLUMPS NOTED ON SMEAR, UNABLE  TO ESTIMATE 86* 78*    Recent Labs    11/23/21 2222 11/24/21 0612 11/25/21 0345 11/25/21 0820  NA 137  --  136  --   K 4.2  --  4.2  --   CL 102  --  103  --   GLUCOSE 234*  --  120*  --   BUN 28*  --  38*  --   CALCIUM 8.9  --  7.8*  --   CREATININE 1.24* 1.25* 1.31* 1.26*     Results for orders placed or performed during Madison hospital encounter of 11/23/21 (from Madison past 24 hour(s))  Glucose, capillary     Status: Abnormal   Collection Time: 11/24/21 11:28 AM  Result Value Ref Range   Glucose-Capillary 130 (H) 70 - 99 mg/dL  Glucose, capillary     Status: Abnormal   Collection Time: 11/24/21  4:56 PM  Result Value Ref Range   Glucose-Capillary 154 (H) 70 - 99 mg/dL  Heparin level (unfractionated)     Status: Abnormal   Collection Time: 11/24/21  6:00 PM  Result Value Ref Range   Heparin Unfractionated <0.10 (L) 0.30 - 0.70 IU/mL  CBC     Status: Abnormal   Collection Time: 11/25/21  3:45 AM  Result Value Ref Range   WBC 13.1 (H) 4.0 - 10.5 K/uL   RBC 4.05 3.87 - 5.11 MIL/uL   Hemoglobin 12.7 12.0 - 15.0 g/dL   HCT 37.1 36.0 - 46.0 %   MCV 91.6 80.0 - 100.0 fL   MCH 31.4 26.0 - 34.0 pg   MCHC 34.2 30.0 - 36.0 g/dL   RDW 13.3 11.5 - 15.5 %   Platelets 86 (L) 150 - 400 K/uL   nRBC 0.0 0.0 - 0.2 %  Comprehensive metabolic panel     Status: Abnormal   Collection Time: 11/25/21  3:45 AM  Result Value Ref Range   Sodium 136 135 - 145 mmol/L   Potassium 4.2 3.5 - 5.1 mmol/L   Chloride 103 98 - 111 mmol/L   CO2 21 (L) 22 - 32 mmol/L   Glucose, Bld 120 (H) 70 - 99 mg/dL   BUN 38 (H) 8 - 23 mg/dL   Creatinine, Ser 1.31 (H) 0.44 - 1.00 mg/dL   Calcium 7.8 (L) 8.9 - 10.3 mg/dL   Total Protein 4.7 (L) 6.5 - 8.1 g/dL   Albumin 2.1 (L) 3.5 - 5.0 g/dL   AST 56 (H) 15 - 41 U/L   ALT 31 0 - 44 U/L   Alkaline Phosphatase 75 38 - 126 U/L   Total Bilirubin 0.6 0.3 - 1.2 mg/dL   GFR, Estimated 37 (L) >60 mL/min   Anion gap 12 5 - 15  Magnesium     Status: Abnormal    Collection Time: 11/25/21  3:45 AM  Result Value Ref Range  Magnesium 1.6 (L) 1.7 - 2.4 mg/dL  Heparin level (unfractionated)     Status: None   Collection Time: 11/25/21  3:45 AM  Result Value Ref Range   Heparin Unfractionated 0.64 0.30 - 0.70 IU/mL  CBC     Status: Abnormal   Collection Time: 11/25/21  8:20 AM  Result Value Ref Range   WBC 12.8 (H) 4.0 - 10.5 K/uL   RBC 4.08 3.87 - 5.11 MIL/uL   Hemoglobin 13.0 12.0 - 15.0 g/dL   HCT 37.2 36.0 - 46.0 %   MCV 91.2 80.0 - 100.0 fL   MCH 31.9 26.0 - 34.0 pg   MCHC 34.9 30.0 - 36.0 g/dL   RDW 13.5 11.5 - 15.5 %   Platelets 78 (L) 150 - 400 K/uL   nRBC 0.0 0.0 - 0.2 %  Creatinine, serum     Status: Abnormal   Collection Time: 11/25/21  8:20 AM  Result Value Ref Range   Creatinine, Ser 1.26 (H) 0.44 - 1.00 mg/dL   GFR, Estimated 38 (L) >60 mL/min   Recent Results (from [place] past 240 hour(s))  Blood Culture (routine x 2)     Status: None (Preliminary result)   Collection Time: 11/23/21 10:25 PM   Specimen: BLOOD  Result Value Ref Range Status   Specimen Description BLOOD SITE NOT SPECIFIED  Final   Special Requests   Final    BOTTLES DRAWN AEROBIC AND ANAEROBIC Blood Culture adequate volume   Culture  Setup Time   Final    GRAM NEGATIVE RODS IN BOTH AEROBIC AND ANAEROBIC BOTTLES CRITICAL RESULT CALLED TO, READ BACK BY AND VERIFIED WITH: E,BREWINGTON PHARMD @0900  11/24/21 EB    Culture   Final    GRAM NEGATIVE RODS IDENTIFICATION AND SUSCEPTIBILITIES TO FOLLOW Performed at [facility], [place] [address] [place], [place] [address]    Report Status PENDING  Incomplete  Urine Culture     Status: Abnormal (Preliminary result)   Collection Time: 11/23/21 10:25 PM   Specimen: In/Out Cath Urine  Result Value Ref Range Status   Specimen Description IN/OUT CATH URINE  Final   Special Requests   Final    NONE Performed at [facility], [place] [address], [place], [place] [address]    Culture >=100,000 COLONIES/mL GRAM  NEGATIVE RODS (A)  Final   Report Status PENDING  Incomplete  Blood Culture ID Panel (Reflexed)     Status: Abnormal   Collection Time: 11/23/21 10:25 PM  Result Value Ref Range Status   Enterococcus faecalis NOT DETECTED NOT DETECTED Final   Enterococcus Faecium NOT DETECTED NOT DETECTED Final   Listeria monocytogenes NOT DETECTED NOT DETECTED Final   Staphylococcus species NOT DETECTED NOT DETECTED Final   Staphylococcus aureus (BCID) NOT DETECTED NOT DETECTED Final   Staphylococcus epidermidis NOT DETECTED NOT DETECTED Final   Staphylococcus lugdunensis NOT DETECTED NOT DETECTED Final   Streptococcus species NOT DETECTED NOT DETECTED Final   Streptococcus agalactiae NOT DETECTED NOT DETECTED Final   Streptococcus pneumoniae NOT DETECTED NOT DETECTED Final   Streptococcus pyogenes NOT DETECTED NOT DETECTED Final   A.calcoaceticus-baumannii NOT DETECTED NOT DETECTED Final   Bacteroides fragilis NOT DETECTED NOT DETECTED Final   Enterobacterales DETECTED (A) NOT DETECTED Final    Comment: Enterobacterales represent a large order of gram negative bacteria, not a single organism. CRITICAL RESULT CALLED TO, READ BACK BY AND VERIFIED WITH: E,BREWINGTON PHARMD @0900  11/24/21 EB    Enterobacter cloacae complex NOT DETECTED NOT  DETECTED Final   Escherichia coli NOT DETECTED NOT DETECTED Final   Klebsiella aerogenes NOT DETECTED NOT DETECTED Final   Klebsiella oxytoca NOT DETECTED NOT DETECTED Final   Klebsiella pneumoniae NOT DETECTED NOT DETECTED Final   Proteus species DETECTED (A) NOT DETECTED Final    Comment: CRITICAL RESULT CALLED TO, READ BACK BY AND VERIFIED WITH: E,BREWINGTON PHARMD @0900  11/24/21 EB    Salmonella species NOT DETECTED NOT DETECTED Final   Serratia marcescens NOT DETECTED NOT DETECTED Final   Haemophilus influenzae NOT DETECTED NOT DETECTED Final   Neisseria meningitidis NOT DETECTED NOT DETECTED Final   Pseudomonas aeruginosa NOT DETECTED NOT DETECTED Final    Stenotrophomonas maltophilia NOT DETECTED NOT DETECTED Final   Candida albicans NOT DETECTED NOT DETECTED Final   Candida auris NOT DETECTED NOT DETECTED Final   Candida glabrata NOT DETECTED NOT DETECTED Final   Candida krusei NOT DETECTED NOT DETECTED Final   Candida parapsilosis NOT DETECTED NOT DETECTED Final   Candida tropicalis NOT DETECTED NOT DETECTED Final   Cryptococcus neoformans/gattii NOT DETECTED NOT DETECTED Final   CTX-M ESBL NOT DETECTED NOT DETECTED Final   Carbapenem resistance IMP NOT DETECTED NOT DETECTED Final   Carbapenem resistance KPC NOT DETECTED NOT DETECTED Final   Carbapenem resistance NDM NOT DETECTED NOT DETECTED Final   Carbapenem resist OXA 48 LIKE NOT DETECTED NOT DETECTED Final   Carbapenem resistance VIM NOT DETECTED NOT DETECTED Final    Comment: Performed at [facility], [address], [place], [place] [address]  Blood Culture (routine x 2)     Status: None (Preliminary result)   Collection Time: 11/23/21 10:30 PM   Specimen: BLOOD  Result Value Ref Range Status   Specimen Description BLOOD SITE NOT SPECIFIED  Final   Special Requests   Final    BOTTLES DRAWN AEROBIC AND ANAEROBIC Blood Culture adequate volume   Culture  Setup Time   Final    GRAM NEGATIVE RODS IN BOTH AEROBIC AND ANAEROBIC BOTTLES CRITICAL VALUE NOTED.  VALUE IS CONSISTENT WITH PREVIOUSLY REPORTED AND CALLED VALUE. Performed at [facility], [place] [address], [place], [place] [address]    Culture GRAM NEGATIVE RODS  Final   Report Status PENDING  Incomplete  Resp Panel by RT-PCR (Flu A&B, Covid) Nasopharyngeal Swab     Status: None   Collection Time: 11/23/21 10:40 PM   Specimen: Nasopharyngeal Swab; Nasopharyngeal(NP) swabs in vial transport medium  Result Value Ref Range Status   SARS Coronavirus 2 by RT PCR NEGATIVE NEGATIVE Final    Comment: (NOTE) SARS-CoV-2 target nucleic acids are NOT DETECTED.  [place] SARS-CoV-2 RNA is generally detectable in upper  respiratory specimens during Madison acute phase of infection. Madison lowest concentration of SARS-CoV-2 viral copies this assay can detect is 138 copies/mL. A negative result does not preclude SARS-Cov-2 infection and should not be used as Madison sole basis for treatment or other Maxwell management decisions. A negative result may occur with  improper specimen collection/handling, submission of specimen other than nasopharyngeal swab, presence of viral mutation(s) within Madison areas targeted by this assay, and inadequate number of viral copies(<138 copies/mL). A negative result must be combined with clinical observations, Maxwell history, and epidemiological information. Madison expected result is Negative.  Fact Sheet for Patients:  EntrepreneurPulse.com.au  Fact Sheet for Healthcare Providers:  IncredibleEmployment.be  This test is no t yet approved or cleared by [place] FDA and  has been authorized for detection and/or diagnosis of SARS-CoV-2  by FDA under an Emergency Use Authorization (EUA). This EUA will remain  in effect (meaning this test can be used) for Madison duration of Madison COVID-19 declaration under Section 564(b)(1) of [place] Act, 21 U.S.C.section 360bbb-3(b)(1), unless [place] authorization is terminated  or revoked sooner.       Influenza A by PCR NEGATIVE NEGATIVE Final   Influenza B by PCR NEGATIVE NEGATIVE Final    Comment: (NOTE) [place] Xpert Xpress SARS-CoV-2/FLU/RSV plus assay is intended as an aid in Madison diagnosis of influenza from Nasopharyngeal swab specimens and should not be used as a sole basis for treatment. Nasal washings and aspirates are unacceptable for Xpert Xpress SARS-CoV-2/FLU/RSV testing.  Fact Sheet for Patients: EntrepreneurPulse.com.au  Fact Sheet for Healthcare Providers: IncredibleEmployment.be  This test is not yet approved or cleared by [place] FDA and has been  authorized for detection and/or diagnosis of SARS-CoV-2 by FDA under an Emergency Use Authorization (EUA). This EUA will remain in effect (meaning this test can be used) for Madison duration of Madison COVID-19 declaration under Section 564(b)(1) of [place] Act, 21 U.S.C. section 360bbb-3(b)(1), unless [place] authorization is terminated or revoked.  Performed at [facility], [place] [address], [place], [place] [address]     Renal Function: Recent Labs    11/23/21 2222 11/24/21 0612 11/25/21 0345 11/25/21 0820  CREATININE 1.24* 1.25* 1.31* 1.26*   Estimated Creatinine Clearance: 19.2 mL/min (A) (by C-G formula based on SCr of 1.26 mg/dL (H)).  Radiologic Imaging: CT ABDOMEN PELVIS WO CONTRAST  Result Date: 11/24/2021 CLINICAL DATA:  Concern for sepsis EXAM: CT ABDOMEN AND PELVIS WITHOUT CONTRAST TECHNIQUE: Multidetector CT imaging of Madison abdomen and pelvis was performed following Madison standard protocol without IV contrast. COMPARISON:  None. FINDINGS: Lower chest: Coronary artery and aortic calcifications. Patchy opacities dependently in both lower lobes could reflect atelectasis. Cannot completely exclude early pneumonia in Madison right lung base. No effusions. Hepatobiliary: Gallstones noted within Madison gallbladder measuring up to 1.5 cm. No focal hepatic abnormality or biliary ductal dilatation. Pancreas: No focal abnormality or ductal dilatation. Spleen: No focal abnormality.  Normal size. Adrenals/Urinary Tract: Bilateral adrenal fullness, likely hyperplasia. No mass or hydronephrosis on Madison left. 2 mm nonobstructing stone in Madison midpole of Madison right kidney. There is severe hydronephrosis and perinephric stranding. Stranding continues along Madison ureter which is difficult to follow. Several calcifications are seen in Madison right pelvis. Given Madison difficulty following Madison right ureter, cannot confirm or exclude ureteral stone. Madison most suspicious calcification for possible ureteral stone is on image 58 of  series 3 measuring 3 mm. Urinary bladder grossly unremarkable. Stomach/Bowel: Moderate stool throughout Madison colon. No evidence of bowel obstruction. Vascular/Lymphatic: Aortoiliac calcifications diffusely. No evidence of aneurysm or adenopathy. Reproductive: No visible focal abnormality. Other: Small amount of free fluid in Madison cul-de-sac and right lower quadrant. No free air. Musculoskeletal: No acute bony abnormality. IMPRESSION: Severe right hydronephrosis and perinephric stranding. Stranding continues around Madison ureter which is difficult to follow. There several calcifications in Madison right pelvis. Madison most suspicious calcification is a 3 mm calcification near Madison pelvic brim, but difficult to confirm this as a ureteral stone. Cholelithiasis. Bibasilar airspace opacities, right greater than left. Favor atelectasis on Madison left. Right basilar opacity could reflect atelectasis or early infiltrate. Moderate stool burden. Small amount of free fluid in Madison right lower quadrant and pelvis. Aortoiliac atherosclerosis. Electronically Signed   By: Rolm Baptise M.D.   On: 11/24/2021 07:44   CT HEAD WO  CONTRAST ( )  Result Date: 11/23/2021 CLINICAL DATA:  Head trauma.  Fall. EXAM: CT HEAD WITHOUT CONTRAST CT CERVICAL SPINE WITHOUT CONTRAST TECHNIQUE: Multidetector CT imaging of [place] head and cervical spine was performed following Madison standard protocol without intravenous contrast. Multiplanar CT image reconstructions of Madison cervical spine were also generated. COMPARISON:  None. FINDINGS: CT HEAD FINDINGS Brain: There is no mass, hemorrhage or extra-axial collection. There is generalized atrophy without lobar predilection. There is hypoattenuation of Madison periventricular white matter, most commonly indicating chronic ischemic microangiopathy. Vascular: Atherosclerotic calcification of Madison internal carotid arteries at Madison skull base. No abnormal hyperdensity of Madison major intracranial arteries or dural venous sinuses.  Skull: Madison visualized skull base, calvarium and extracranial soft tissues are normal. Sinuses/Orbits: No fluid levels or advanced mucosal thickening of Madison visualized paranasal sinuses. No mastoid or middle ear effusion. Madison orbits are normal. CT CERVICAL SPINE FINDINGS Alignment: Grade 1 anterolisthesis at C3-4 and C4-5. Skull base and vertebrae: No acute fracture. Soft tissues and spinal canal: No prevertebral fluid or swelling. No visible canal hematoma. Disc levels: No advanced spinal canal or neural foraminal stenosis. Upper chest: No pneumothorax, pulmonary nodule or pleural effusion. Other: Unchanged appearance of exophytic right thyroid nodule measuring 1.4 cm. IMPRESSION: 1. Chronic ischemic microangiopathy and generalized atrophy without acute intracranial abnormality. 2. No acute fracture or static subluxation of [place] cervical spine. 3. 1.4 cm right thyroid nodule, unchanged. Not clinically significant; no follow-up imaging recommended (ref: J Am Coll Radiol. 2015 Feb;12(2): 143-50). Electronically Signed   By: Deatra Robinson M.D.   On: 11/23/2021 23:52   CT Cervical Spine Wo Contrast  Result Date: 11/23/2021 CLINICAL DATA:  Head trauma.  Fall. EXAM: CT HEAD WITHOUT CONTRAST CT CERVICAL SPINE WITHOUT CONTRAST TECHNIQUE: Multidetector CT imaging of [place] head and cervical spine was performed following Madison standard protocol without intravenous contrast. Multiplanar CT image reconstructions of [place] cervical spine were also generated. COMPARISON:  None. FINDINGS: CT HEAD FINDINGS Brain: There is no mass, hemorrhage or extra-axial collection. There is generalized atrophy without lobar predilection. There is hypoattenuation of [place] periventricular white matter, most commonly indicating chronic ischemic microangiopathy. Vascular: Atherosclerotic calcification of [place] internal carotid arteries at Madison skull base. No abnormal hyperdensity of Madison major intracranial arteries or dural venous sinuses. Skull: Madison  visualized skull base, calvarium and extracranial soft tissues are normal. Sinuses/Orbits: No fluid levels or advanced mucosal thickening of Madison visualized paranasal sinuses. No mastoid or middle ear effusion. Madison orbits are normal. CT CERVICAL SPINE FINDINGS Alignment: Grade 1 anterolisthesis at C3-4 and C4-5. Skull base and vertebrae: No acute fracture. Soft tissues and spinal canal: No prevertebral fluid or swelling. No visible canal hematoma. Disc levels: No advanced spinal canal or neural foraminal stenosis. Upper chest: No pneumothorax, pulmonary nodule or pleural effusion. Other: Unchanged appearance of exophytic right thyroid nodule measuring 1.4 cm. IMPRESSION: 1. Chronic ischemic microangiopathy and generalized atrophy without acute intracranial abnormality. 2. No acute fracture or static subluxation of [place] cervical spine. 3. 1.4 cm right thyroid nodule, unchanged. Not clinically significant; no follow-up imaging recommended (ref: J Am Coll Radiol. 2015 Feb;12(2): 143-50). Electronically Signed   By: Deatra Robinson M.D.   On: 11/23/2021 23:52   DG Pelvis Portable  Result Date: 11/24/2021 CLINICAL DATA:  Fall EXAM: PORTABLE CHEST 1 VIEW PORTABLE PELVIS 1 VIEW COMPARISON:  None. FINDINGS: Madison heart size and mediastinal contours are within normal limits. Both lungs are clear. Madison visualized skeletal structures are unremarkable. No pelvic fracture. IMPRESSION: No  active disease. No pelvic fracture. Electronically Signed   By: Ulyses Jarred M.D.   On: 11/24/2021 00:43   DG Chest Port 1 View  Result Date: 11/24/2021 CLINICAL DATA:  Fall EXAM: PORTABLE CHEST 1 VIEW PORTABLE PELVIS 1 VIEW COMPARISON:  None. FINDINGS: Madison heart size and mediastinal contours are within normal limits. Both lungs are clear. Madison visualized skeletal structures are unremarkable. No pelvic fracture. IMPRESSION: No active disease. No pelvic fracture. Electronically Signed   By: Ulyses Jarred M.D.   On: 11/24/2021 00:43    I  independently reviewed Madison above imaging studies.  Impression/Recommendation: 1.  Right ureteral obstruction with associated urosepsis and bacteremia. 2.  End-stage dementia Plan/recommendation: I had a lengthy discussion with Madison Maxwell's son and daughter and discussed Madison need for ureteral stent for management of her hydronephrosis.  Madison Maxwell does have end-stage dementia and was essentially total care at [place] facility by their report.  I talked about risks and benefits of Madison procedure and she is a DNR and they do not want her to be intubated.  We discussed need of anesthetic which likely would require intubation and discussed ramifications if she were to have worsening hypotension requiring resuscitative measures.  After Madison discussion family is opted for care and comfort and has opted for no surgical intervention with stent.  I discussed with hospitalist.  We will plan to sign off please reconsult urology as needed  Remi Haggard 11/25/2021, 9:26 AM     CC:

## 2021-11-26 DIAGNOSIS — Z515 Encounter for palliative care: Secondary | ICD-10-CM

## 2021-11-26 LAB — CULTURE, BLOOD (ROUTINE X 2)
Special Requests: ADEQUATE
Special Requests: ADEQUATE

## 2021-11-26 LAB — URINE CULTURE: Culture: >=100000 — AB

## 2021-11-26 NOTE — Care Management (Addendum)
Per Edson Snowball with [facility] , family are at [facility] now starting consents. She would like PTAR arranged for pick at 1730 or later .   Nurse to call report to [facility] at [phone].   PTAR paperwork and signed DNR form on patient's chart

## 2021-11-26 NOTE — Progress Notes (Signed)
°  Subjective:  Patient ID: Madison Maxwell, female    DOB: 09-Oct-1920,  MRN: [id number]  Madison Maxwell presents to clinic today for painful elongated mycotic toenails 1-5 bilaterally which are tender when wearing enclosed shoe gear. Pain is relieved with periodic professional debridement.  Patient is accompanied by her son on today's visit. Sadly, [name] has lost her husband since her last visit with [place].  PCP is Perini, Loraine Leriche, MD , and last visit was 3-6 months ago.  No Known Allergies  Review of Systems: Negative except as noted in the HPI. Objective:   Constitutional Madison Maxwell is a pleasant 87 y.o. Caucasian female, frail, in NAD. AAO x 3.   Vascular CFT immediate b/l LE. Palpable DP/PT pulses b/l LE. Digital hair absent b/l. Skin temperature gradient WNL b/l. No pain with calf compression b/l. No edema noted b/l. No cyanosis or clubbing noted b/l LE.  Neurologic Normal speech. Oriented to person, place, and time. Protective sensation intact 5/5 intact bilaterally with 10g monofilament b/l. Vibratory sensation intact b/l.  Dermatologic Pedal skin thin and atrophic b/l LE. No open wounds b/l LE. No interdigital macerations noted b/l LE. Toenails 1-5 b/l elongated, discolored, dystrophic, thickened, crumbly with subungual debris and tenderness to dorsal palpation.  Orthopedic: Noted disuse atrophy bilaterally. No pain, crepitus or joint limitation noted with ROM bilateral LE. HAV with bunion deformity noted b/l LE.   Radiographs: None  Last A1c:  Hemoglobin A1C Latest Ref Rng & Units 11/24/2021  HGBA1C 4.8 - 5.6 % 5.6  Some recent data might be hidden   Assessment:   1. Pain due to onychomycosis of toenails of both feet    Plan:  Patient was evaluated and treated and all questions answered. Consent given for treatment as described below: -Examined patient. -No new findings. No new orders. -Mycotic toenails 1-5 bilaterally were debrided in length and girth with  sterile nail nippers and dremel without incident. -Patient/POA to call should there be question/concern in the interim.  Return in about 3 months (around 02/17/2022).  Freddie Breech, DPM

## 2021-11-26 NOTE — Progress Notes (Signed)
Chart reviewed, transitioned to comfort care. All meds appear to have been stopped with PRNs only on board. No longer on telemetry.   [facility] will sign off.   Medication Recommendations:  N/A Other recommendations (labs, testing, etc):  N/A Follow up as an outpatient:  N/A  Please call us back if we can be of any assistance.  Zarriah Starkel PA-C

## 2021-11-26 NOTE — Progress Notes (Signed)
Daily Progress Note   Patient Name: Madison Maxwell       Date: 11/26/2021 DOB: [birth year]  Age: 87 y.o. MRN#: [id number] Attending Physician: Aline August, MD Primary Care Physician: Crist Infante, MD Admit Date: 11/23/2021  Reason for Consultation/Follow-up: Establishing goals of care  Subjective: Slightly wakes to voice - some vocalization but no discernable words, quickly falls back to sleep.   Checked in with RN - no concerns.   Length of Stay: 2  Current Medications: Scheduled Meds:    Continuous Infusions:   PRN Meds: acetaminophen **OR** acetaminophen, glycopyrrolate, LORazepam, morphine injection  Physical Exam Constitutional:      General: Madison Maxwell is not in acute distress.    Comments: somnolent  Cardiovascular:     Rate and Rhythm: Tachycardia present.  Pulmonary:     Effort: Pulmonary effort is normal. No respiratory distress.  Skin:    General: Skin is warm and dry.  Neurological:     Mental Status: Madison Maxwell is disoriented.            Vital Signs: BP (!) 95/58 (BP Location: Left Arm)    Pulse (!) 118    Temp 97.7 F (36.5 C) (Oral)    Resp 20    Ht 5\' 5"  (1.651 m)    Wt 50 kg    SpO2 93%    BMI 18.34 kg/m  SpO2: SpO2: 93 % O2 Device: O2 Device: Room Air O2 Flow Rate:    Intake/output summary:  Intake/Output Summary (Last 24 hours) at 11/26/2021 0947 Last data filed at 11/26/2021 0900 Gross per 24 hour  Intake 0 ml  Output --  Net 0 ml   LBM: Last BM Date:  (unk) Baseline Weight: Weight: 49.5 kg Most recent weight: Weight: 50 kg       Palliative Assessment/Data: PPS 10%      Patient Active Problem List   Diagnosis Date Noted   Sepsis ([place]) 11/24/2021   Acute cystitis without hematuria    Bilateral impacted cerumen 11/12/2020   Sensorineural hearing loss  (SNHL), bilateral 11/12/2020   Intracranial bleeding ([place]) 10/05/2017   Fall from slip, trip, or stumble, initial encounter 10/05/2017   Contusion of face 10/05/2017   Hypertensive heart disease without CHF 06/07/2017   Current use of long term anticoagulation 06/07/2017   Leukopenia 06/06/2017   Thrombocytopenia ([place]) 06/06/2017   Paroxysmal atrial fibrillation ([place]) 06/06/2017   History of CVA (cerebrovascular accident) 06/06/2017   Gait disorder 12/23/2016   Dyslipidemia 06/17/2016   Ataxia    Memory deficit     Palliative Care Assessment & Plan   HPI: 87 y.o. female   admitted on 11/23/2021 with history of dementia, paroxysmal atrial fibrillation not on anticoagulation was brought to the ER after patient was found to be on the floor.  Patient lives at [facility] independent living    Madison Maxwell was not eating well.  Later when he left the assisted living facility nurse call that he Madison Maxwell had a fall.  Patient usually walks with the help of walker to the table in Madison Maxwell room.  Otherwise Madison Maxwell is requiring complete assist because of Madison Maxwell dementia.   Patient's husband died at the beginning of [place]  Assessment: Wakes to voice briefly, quickly back to sleep. Meds reviewed - received PRN morphine and ativan. Patient appears comfortable - no s/s of pain/distress. RN reports no concerns. Spoke with son, Madison Maxwell. We review his conversation with Madison Dandy, NP yesterday. We review his interest in [facility] - he confirms he would still be interested in [facility] placement. We discussed focusing on Madison Maxwell comfort. He agreed this is main priority. We reviewed current symptom management in the hospital - he would like to ensure Madison Maxwell pain is controlled. He is interested in speaking with hospice liaison when referral is made.  All questions and concerns addressed.    Recommendations/Plan: Continue comfort measures only - PRN morphine and ativan Transfer to [facility] when bed available  Goals of  Care and Additional Recommendations: Limitations on Scope of Treatment: Full Comfort Care  Code Status: DNR  Prognosis:  < 2 weeks  Discharge Planning: Hospice facility  Care plan was discussed with [facility] team, hospice liaison, RN, patient's son Madison Maxwell  Thank you for allowing the Palliative Medicine Team to assist in the care of this patient.   Total Time 45 minutes Prolonged Time Billed  no       Greater than 50%  of this time was spent counseling and coordinating care related to the above assessment and plan.  Madison Ren, DNP, [facility] Palliative Medicine Team Team Phone # [phone]  Pager [phone]

## 2021-11-26 NOTE — Discharge Summary (Signed)
Physician Discharge Summary  ALLEANE GLUCK [id number] DOB: 03/24/1920 DOA: 11/23/2021  PCP: Crist Infante, MD  Admit date: 11/23/2021 Discharge date: 11/26/2021  Admitted From: Home Disposition: Residential hospice  Recommendations for Outpatient Follow-up:  Follow up with Residential hospice at earliest [place]: No Equipment/Devices: None  Discharge Condition: Poor CODE STATUS: DNR Diet recommendation: As per comfort measures  Brief/Interim Summary: 87 y.o. female with history of dementia, paroxysmal atrial fibrillation not on anticoagulation presented from assisted living facility after she was found to be on the floor lethargic.  On presentation, she had a temperature of 101.9 with tachycardia and lactic acidosis.  UA was consistent with UTI.  Chest x-ray was unremarkable.  CT of the head and cervical spine was unremarkable.  X-ray of pelvis did not show any fracture.  She was started on IV fluids and antibiotics.  She was found to have Proteus bacteremia along with acute right-sided pyelonephritis with severe right hydronephrosis and possible right urolithiasis.  She also had paroxysmal A. fib with RVR requiring cardiology evaluation; amiodarone and heparin drips; cardiology subsequently discontinued the strips and switched her to oral amiodarone.  After discussion with urology, patient's family decided to not proceed with urological intervention.  After palliative care discussion, family decided to pursue comfort measures.  She was switched to comfort measures only status on 11/25/2021. She will be discharged to residential hospice once bed is available.  Discharge Diagnoses:   Comfort measures only status Severe sepsis: Present on admission; probably from below Acute right-sided pyelonephritis with severe right hydronephrosis and possible right ureterolithiasis Proteus bacteremia Leukocytosis Thrombocytopenia Paroxysmal A. fib with RVR History of  dementia Acute kidney injury has been ruled out Mid buttocks stage I pressure injury: Present on admission Left heel stage I pressure injury: Present on admission Right heel stage I pressure injury: Present on admission  Plan -As discussed above, she was switched to comfort measures only status on 11/25/2021.   She will be discharged to residential hospice once bed is available.    Discharge Instructions  Discharge Instructions     No wound care   Complete by: As directed       Allergies as of 11/26/2021   No Known Allergies      Medication List     STOP taking these medications    cholecalciferol 25 MCG (1000 UNIT) tablet Commonly known as: VITAMIN D   galantamine 16 MG 24 hr capsule Commonly known as: RAZADYNE ER   lipase/protease/amylase 36000 UNITS Cpep capsule Commonly known as: CREON   metoprolol tartrate 25 MG tablet Commonly known as: LOPRESSOR   multivitamin with minerals Tabs tablet   Omega-3 1000 MG Caps   Probiotic Daily Caps   PROLIA [place]   vitamin B-12 1000 MCG tablet Commonly known as: CYANOCOBALAMIN   vitamin C 500 MG tablet Commonly known as: ASCORBIC ACID        No Known Allergies  Consultations: Cardiology/urology/palliative care   Procedures/Studies: CT ABDOMEN PELVIS WO CONTRAST  Result Date: 11/24/2021 CLINICAL DATA:  Concern for sepsis EXAM: CT ABDOMEN AND PELVIS WITHOUT CONTRAST TECHNIQUE: Multidetector CT imaging of the abdomen and pelvis was performed following the standard protocol without IV contrast. COMPARISON:  None. FINDINGS: Lower chest: Coronary artery and aortic calcifications. Patchy opacities dependently in both lower lobes could reflect atelectasis. Cannot completely exclude early pneumonia in the right lung base. No effusions. Hepatobiliary: Gallstones noted within the gallbladder measuring up to 1.5 cm. No focal hepatic abnormality or biliary ductal  dilatation. Pancreas: No focal abnormality or ductal  dilatation. Spleen: No focal abnormality.  Normal size. Adrenals/Urinary Tract: Bilateral adrenal fullness, likely hyperplasia. No mass or hydronephrosis on the left. 2 mm nonobstructing stone in the midpole of the right kidney. There is severe hydronephrosis and perinephric stranding. Stranding continues along the ureter which is difficult to follow. Several calcifications are seen in the right pelvis. Given the difficulty following the right ureter, cannot confirm or exclude ureteral stone. The most suspicious calcification for possible ureteral stone is on image 58 of series 3 measuring 3 mm. Urinary bladder grossly unremarkable. Stomach/Bowel: Moderate stool throughout the colon. No evidence of bowel obstruction. Vascular/Lymphatic: Aortoiliac calcifications diffusely. No evidence of aneurysm or adenopathy. Reproductive: No visible focal abnormality. Other: Small amount of free fluid in the cul-de-sac and right lower quadrant. No free air. Musculoskeletal: No acute bony abnormality. IMPRESSION: Severe right hydronephrosis and perinephric stranding. Stranding continues around the ureter which is difficult to follow. There several calcifications in the right pelvis. The most suspicious calcification is a 3 mm calcification near the pelvic brim, but difficult to confirm this as a ureteral stone. Cholelithiasis. Bibasilar airspace opacities, right greater than left. Favor atelectasis on the left. Right basilar opacity could reflect atelectasis or early infiltrate. Moderate stool burden. Small amount of free fluid in the right lower quadrant and pelvis. Aortoiliac atherosclerosis. Electronically Signed   By: Rolm Baptise M.D.   On: 11/24/2021 07:44   CT HEAD WO CONTRAST (5MM)  Result Date: 11/23/2021 CLINICAL DATA:  Head trauma.  Fall. EXAM: CT HEAD WITHOUT CONTRAST CT CERVICAL SPINE WITHOUT CONTRAST TECHNIQUE: Multidetector CT imaging of the head and cervical spine was performed following the standard protocol  without intravenous contrast. Multiplanar CT image reconstructions of the cervical spine were also generated. COMPARISON:  None. FINDINGS: CT HEAD FINDINGS Brain: There is no mass, hemorrhage or extra-axial collection. There is generalized atrophy without lobar predilection. There is hypoattenuation of the periventricular white matter, most commonly indicating chronic ischemic microangiopathy. Vascular: Atherosclerotic calcification of the internal carotid arteries at the skull base. No abnormal hyperdensity of the major intracranial arteries or dural venous sinuses. Skull: The visualized skull base, calvarium and extracranial soft tissues are normal. Sinuses/Orbits: No fluid levels or advanced mucosal thickening of the visualized paranasal sinuses. No mastoid or middle ear effusion. The orbits are normal. CT CERVICAL SPINE FINDINGS Alignment: Grade 1 anterolisthesis at C3-4 and C4-5. Skull base and vertebrae: No acute fracture. Soft tissues and spinal canal: No prevertebral fluid or swelling. No visible canal hematoma. Disc levels: No advanced spinal canal or neural foraminal stenosis. Upper chest: No pneumothorax, pulmonary nodule or pleural effusion. Other: Unchanged appearance of exophytic right thyroid nodule measuring 1.4 cm. IMPRESSION: 1. Chronic ischemic microangiopathy and generalized atrophy without acute intracranial abnormality. 2. No acute fracture or static subluxation of the cervical spine. 3. 1.4 cm right thyroid nodule, unchanged. Not clinically significant; no follow-up imaging recommended (ref: J Am Coll Radiol. 2015 Feb;12(2): 143-50). Electronically Signed   By: Ulyses Jarred M.D.   On: 11/23/2021 23:52   CT Cervical Spine Wo Contrast  Result Date: 11/23/2021 CLINICAL DATA:  Head trauma.  Fall. EXAM: CT HEAD WITHOUT CONTRAST CT CERVICAL SPINE WITHOUT CONTRAST TECHNIQUE: Multidetector CT imaging of the head and cervical spine was performed following the standard protocol without intravenous  contrast. Multiplanar CT image reconstructions of the cervical spine were also generated. COMPARISON:  None. FINDINGS: CT HEAD FINDINGS Brain: There is no mass, hemorrhage or extra-axial collection. There  is generalized atrophy without lobar predilection. There is hypoattenuation of the periventricular white matter, most commonly indicating chronic ischemic microangiopathy. Vascular: Atherosclerotic calcification of the internal carotid arteries at the skull base. No abnormal hyperdensity of the major intracranial arteries or dural venous sinuses. Skull: The visualized skull base, calvarium and extracranial soft tissues are normal. Sinuses/Orbits: No fluid levels or advanced mucosal thickening of the visualized paranasal sinuses. No mastoid or middle ear effusion. The orbits are normal. CT CERVICAL SPINE FINDINGS Alignment: Grade 1 anterolisthesis at C3-4 and C4-5. Skull base and vertebrae: No acute fracture. Soft tissues and spinal canal: No prevertebral fluid or swelling. No visible canal hematoma. Disc levels: No advanced spinal canal or neural foraminal stenosis. Upper chest: No pneumothorax, pulmonary nodule or pleural effusion. Other: Unchanged appearance of exophytic right thyroid nodule measuring 1.4 cm. IMPRESSION: 1. Chronic ischemic microangiopathy and generalized atrophy without acute intracranial abnormality. 2. No acute fracture or static subluxation of the cervical spine. 3. 1.4 cm right thyroid nodule, unchanged. Not clinically significant; no follow-up imaging recommended (ref: J Am Coll Radiol. 2015 Feb;12(2): 143-50). Electronically Signed   By: Ulyses Jarred M.D.   On: 11/23/2021 23:52   DG Pelvis Portable  Result Date: 11/24/2021 CLINICAL DATA:  Fall EXAM: PORTABLE CHEST 1 VIEW PORTABLE PELVIS 1 VIEW COMPARISON:  None. FINDINGS: The heart size and mediastinal contours are within normal limits. Both lungs are clear. The visualized skeletal structures are unremarkable. No pelvic fracture.  IMPRESSION: No active disease. No pelvic fracture. Electronically Signed   By: Ulyses Jarred M.D.   On: 11/24/2021 00:43   DG Chest Port 1 View  Result Date: 11/24/2021 CLINICAL DATA:  Fall EXAM: PORTABLE CHEST 1 VIEW PORTABLE PELVIS 1 VIEW COMPARISON:  None. FINDINGS: The heart size and mediastinal contours are within normal limits. Both lungs are clear. The visualized skeletal structures are unremarkable. No pelvic fracture. IMPRESSION: No active disease. No pelvic fracture. Electronically Signed   By: Ulyses Jarred M.D.   On: 11/24/2021 00:43      Subjective: Patient seen and examined at bedside.  Extremely poor historian.  Confused.  Discharge Exam: Vitals:   11/25/21 2353 11/26/21 0911  BP: 113/78 (!) 95/58  Pulse: (!) 126 (!) 118  Resp: 16 20  Temp: 99.1 F (37.3 C) 97.7 F (36.5 C)  SpO2: 95% 93%    General exam: Looks chronically ill and deconditioned.  Confused.  Currently no distress.  Currently on room air.      The results of significant diagnostics from this hospitalization (including imaging, microbiology, ancillary and laboratory) are listed below for reference.     Microbiology: Recent Results (from the past 240 hour(s))  Blood Culture (routine x 2)     Status: Abnormal   Collection Time: 11/23/21 10:25 PM   Specimen: BLOOD  Result Value Ref Range Status   Specimen Description BLOOD SITE NOT SPECIFIED  Final   Special Requests   Final    BOTTLES DRAWN AEROBIC AND ANAEROBIC Blood Culture adequate volume   Culture  Setup Time   Final    GRAM NEGATIVE RODS IN BOTH AEROBIC AND ANAEROBIC BOTTLES CRITICAL RESULT CALLED TO, READ BACK BY AND VERIFIED WITH: E,BREWINGTON PHARMD @0900  11/24/21 EB Performed at [facility], [place] [address], [place], [place] [address]    Culture PROTEUS MIRABILIS (A)  Final   Report Status 11/26/2021 FINAL  Final   Organism ID, Bacteria PROTEUS MIRABILIS  Final      Susceptibility   Proteus  mirabilis - MIC*    AMPICILLIN  <=2 SENSITIVE Sensitive     CEFAZOLIN 8 SENSITIVE Sensitive     CEFEPIME <=0.12 SENSITIVE Sensitive     CEFTAZIDIME <=1 SENSITIVE Sensitive     CEFTRIAXONE <=0.25 SENSITIVE Sensitive     CIPROFLOXACIN <=0.25 SENSITIVE Sensitive     GENTAMICIN <=1 SENSITIVE Sensitive     IMIPENEM 2 SENSITIVE Sensitive     TRIMETH/SULFA <=20 SENSITIVE Sensitive     AMPICILLIN/SULBACTAM <=2 SENSITIVE Sensitive     PIP/TAZO <=4 SENSITIVE Sensitive     * PROTEUS MIRABILIS  Urine Culture     Status: Abnormal   Collection Time: 11/23/21 10:25 PM   Specimen: In/Out Cath Urine  Result Value Ref Range Status   Specimen Description IN/OUT CATH URINE  Final   Special Requests   Final    NONE Performed at [facility], [address] [place], [place] [id number]    Culture (A)  Final    >=100,000 COLONIES/mL PROTEUS MIRABILIS >=100,000 COLONIES/mL ESCHERICHIA COLI    Report Status 11/26/2021 FINAL  Final   Organism ID, Bacteria PROTEUS MIRABILIS (A)  Final   Organism ID, Bacteria ESCHERICHIA COLI (A)  Final      Susceptibility   Escherichia coli - MIC*    AMPICILLIN >=32 RESISTANT Resistant     CEFAZOLIN 16 SENSITIVE Sensitive     CEFEPIME <=0.12 SENSITIVE Sensitive     CEFTRIAXONE <=0.25 SENSITIVE Sensitive     CIPROFLOXACIN <=0.25 SENSITIVE Sensitive     GENTAMICIN <=1 SENSITIVE Sensitive     IMIPENEM <=0.25 SENSITIVE Sensitive     NITROFURANTOIN <=16 SENSITIVE Sensitive     TRIMETH/SULFA <=20 SENSITIVE Sensitive     AMPICILLIN/SULBACTAM >=32 RESISTANT Resistant     PIP/TAZO 8 SENSITIVE Sensitive     * >=100,000 COLONIES/mL ESCHERICHIA COLI   Proteus mirabilis - MIC*    AMPICILLIN <=2 SENSITIVE Sensitive     CEFAZOLIN 8 SENSITIVE Sensitive     CEFEPIME <=0.12 SENSITIVE Sensitive     CEFTRIAXONE <=0.25 SENSITIVE Sensitive     CIPROFLOXACIN <=0.25 SENSITIVE Sensitive     GENTAMICIN <=1 SENSITIVE Sensitive     IMIPENEM 2 SENSITIVE Sensitive     NITROFURANTOIN 128 RESISTANT Resistant      TRIMETH/SULFA <=20 SENSITIVE Sensitive     AMPICILLIN/SULBACTAM <=2 SENSITIVE Sensitive     PIP/TAZO <=4 SENSITIVE Sensitive     * >=100,000 COLONIES/mL PROTEUS MIRABILIS  Blood Culture ID Panel (Reflexed)     Status: Abnormal   Collection Time: 11/23/21 10:25 PM  Result Value Ref Range Status   Enterococcus faecalis NOT DETECTED NOT DETECTED Final   Enterococcus Faecium NOT DETECTED NOT DETECTED Final   Listeria monocytogenes NOT DETECTED NOT DETECTED Final   Staphylococcus species NOT DETECTED NOT DETECTED Final   Staphylococcus aureus (BCID) NOT DETECTED NOT DETECTED Final   Staphylococcus epidermidis NOT DETECTED NOT DETECTED Final   Staphylococcus lugdunensis NOT DETECTED NOT DETECTED Final   Streptococcus species NOT DETECTED NOT DETECTED Final   Streptococcus agalactiae NOT DETECTED NOT DETECTED Final   Streptococcus pneumoniae NOT DETECTED NOT DETECTED Final   Streptococcus pyogenes NOT DETECTED NOT DETECTED Final   A.calcoaceticus-baumannii NOT DETECTED NOT DETECTED Final   Bacteroides fragilis NOT DETECTED NOT DETECTED Final   Enterobacterales DETECTED (A) NOT DETECTED Final    Comment: Enterobacterales represent a large order of gram negative bacteria, not a single organism. CRITICAL RESULT CALLED TO, READ BACK BY AND VERIFIED WITH: E,BREWINGTON PHARMD @0900  11/24/21 EB  Enterobacter cloacae complex NOT DETECTED NOT DETECTED Final   Escherichia coli NOT DETECTED NOT DETECTED Final   Klebsiella aerogenes NOT DETECTED NOT DETECTED Final   Klebsiella oxytoca NOT DETECTED NOT DETECTED Final   Klebsiella pneumoniae NOT DETECTED NOT DETECTED Final   Proteus species DETECTED (A) NOT DETECTED Final    Comment: CRITICAL RESULT CALLED TO, READ BACK BY AND VERIFIED WITH: E,BREWINGTON PHARMD @0900  11/24/21 EB    Salmonella species NOT DETECTED NOT DETECTED Final   Serratia marcescens NOT DETECTED NOT DETECTED Final   Haemophilus influenzae NOT DETECTED NOT DETECTED Final    Neisseria meningitidis NOT DETECTED NOT DETECTED Final   Pseudomonas aeruginosa NOT DETECTED NOT DETECTED Final   Stenotrophomonas maltophilia NOT DETECTED NOT DETECTED Final   Candida albicans NOT DETECTED NOT DETECTED Final   Candida auris NOT DETECTED NOT DETECTED Final   Candida glabrata NOT DETECTED NOT DETECTED Final   Candida krusei NOT DETECTED NOT DETECTED Final   Candida parapsilosis NOT DETECTED NOT DETECTED Final   Candida tropicalis NOT DETECTED NOT DETECTED Final   Cryptococcus neoformans/gattii NOT DETECTED NOT DETECTED Final   CTX-M ESBL NOT DETECTED NOT DETECTED Final   Carbapenem resistance IMP NOT DETECTED NOT DETECTED Final   Carbapenem resistance KPC NOT DETECTED NOT DETECTED Final   Carbapenem resistance NDM NOT DETECTED NOT DETECTED Final   Carbapenem resist OXA 48 LIKE NOT DETECTED NOT DETECTED Final   Carbapenem resistance VIM NOT DETECTED NOT DETECTED Final    Comment: Performed at [facility], [address], [place], [place] [address]  Blood Culture (routine x 2)     Status: Abnormal   Collection Time: 11/23/21 10:30 PM   Specimen: BLOOD  Result Value Ref Range Status   Specimen Description BLOOD SITE NOT SPECIFIED  Final   Special Requests   Final    BOTTLES DRAWN AEROBIC AND ANAEROBIC Blood Culture adequate volume   Culture  Setup Time   Final    GRAM NEGATIVE RODS IN BOTH AEROBIC AND ANAEROBIC BOTTLES CRITICAL VALUE NOTED.  VALUE IS CONSISTENT WITH PREVIOUSLY REPORTED AND CALLED VALUE.    Culture (A)  Final    PROTEUS MIRABILIS SUSCEPTIBILITIES PERFORMED ON PREVIOUS CULTURE WITHIN THE LAST 5 DAYS. Performed at [facility], [place] [address] [place], [place] [address]    Report Status 11/26/2021 FINAL  Final  Resp Panel by RT-PCR (Flu A&B, Covid) Nasopharyngeal Swab     Status: None   Collection Time: 11/23/21 10:40 PM   Specimen: Nasopharyngeal Swab; Nasopharyngeal(NP) swabs in vial transport medium  Result Value Ref Range Status    SARS Coronavirus 2 by RT PCR NEGATIVE NEGATIVE Final    Comment: (NOTE) SARS-CoV-2 target nucleic acids are NOT DETECTED.  The SARS-CoV-2 RNA is generally detectable in upper respiratory specimens during the acute phase of infection. The lowest concentration of SARS-CoV-2 viral copies this assay can detect is 138 copies/mL. A negative result does not preclude SARS-Cov-2 infection and should not be used as the sole basis for treatment or other patient management decisions. A negative result may occur with  improper specimen collection/handling, submission of specimen other than nasopharyngeal swab, presence of viral mutation(s) within the areas targeted by this assay, and inadequate number of viral copies(<138 copies/mL). A negative result must be combined with clinical observations, patient history, and epidemiological information. The expected result is Negative.  Fact Sheet for Patients:  EntrepreneurPulse.com.au  Fact Sheet for Healthcare Providers:  IncredibleEmployment.be  This test is no t yet  approved or cleared by the [place] and  has been authorized for detection and/or diagnosis of SARS-CoV-2 by FDA under an Emergency Use Authorization (EUA). This EUA will remain  in effect (meaning this test can be used) for the duration of the COVID-19 declaration under Section 564(b)(1) of the Act, 21 U.S.C.section 360bbb-3(b)(1), unless the authorization is terminated  or revoked sooner.       Influenza A by PCR NEGATIVE NEGATIVE Final   Influenza B by PCR NEGATIVE NEGATIVE Final    Comment: (NOTE) The Xpert Xpress SARS-CoV-2/FLU/RSV plus assay is intended as an aid in the diagnosis of influenza from Nasopharyngeal swab specimens and should not be used as a sole basis for treatment. Nasal washings and aspirates are unacceptable for Xpert Xpress SARS-CoV-2/FLU/RSV testing.  Fact Sheet for  Patients: EntrepreneurPulse.com.au  Fact Sheet for Healthcare Providers: IncredibleEmployment.be  This test is not yet approved or cleared by the [place] FDA and has been authorized for detection and/or diagnosis of SARS-CoV-2 by FDA under an Emergency Use Authorization (EUA). This EUA will remain in effect (meaning this test can be used) for the duration of the COVID-19 declaration under Section 564(b)(1) of the Act, 21 U.S.C. section 360bbb-3(b)(1), unless the authorization is terminated or revoked.  Performed at [facility], [place] [address], [place], [place] [address]      Labs: BNP (last 3 results) No results for input(s): BNP in the last 8760 hours. Basic Metabolic Panel: Recent Labs  Lab 11/23/21 2222 11/24/21 0612 11/25/21 0345 11/25/21 0820  NA 137  --  136  --   K 4.2  --  4.2  --   CL 102  --  103  --   CO2 18*  --  21*  --   GLUCOSE 234*  --  120*  --   BUN 28*  --  38*  --   CREATININE 1.24* 1.25* 1.31* 1.26*  CALCIUM 8.9  --  7.8*  --   MG  --   --  1.6*  --    Liver Function Tests: Recent Labs  Lab 11/23/21 2222 11/25/21 0345  AST 54* 56*  ALT 38 31  ALKPHOS 74 75  BILITOT 0.9 0.6  PROT 6.5 4.7*  ALBUMIN 3.5 2.1*   No results for input(s): LIPASE, AMYLASE in the last 168 hours. No results for input(s): AMMONIA in the last 168 hours. CBC: Recent Labs  Lab 11/23/21 2222 11/24/21 0612 11/25/21 0345 11/25/21 0820  WBC 9.2 12.7* 13.1* 12.8*  NEUTROABS 8.9*  --   --   --   HGB 14.3 12.1 12.7 13.0  HCT 44.2 35.8* 37.1 37.2  MCV 97.1 93.5 91.6 91.2  PLT 147* PLATELET CLUMPS NOTED ON SMEAR, UNABLE TO ESTIMATE 86* 78*   Cardiac Enzymes: Recent Labs  Lab 11/23/21 2222  CKTOTAL 65   BNP: Invalid input(s): POCBNP CBG: Recent Labs  Lab 11/23/21 2220 11/24/21 1128 11/24/21 1656  GLUCAP 239* 130* 154*   D-Dimer No results for input(s): DDIMER in the last 72 hours. Hgb A1c Recent Labs     11/24/21 0612  HGBA1C 5.6   Lipid Profile No results for input(s): CHOL, HDL, LDLCALC, TRIG, CHOLHDL, LDLDIRECT in the last 72 hours. Thyroid function studies Recent Labs    11/23/21 2222  TSH 3.147   Anemia work up No results for input(s): VITAMINB12, FOLATE, FERRITIN, TIBC, IRON, RETICCTPCT in the last 72 hours. Urinalysis    Component Value Date/Time   COLORURINE YELLOW 11/23/2021  [place] 11/23/2021 2225   LABSPEC >1.030 (H) 11/23/2021 2225   PHURINE 6.0 11/23/2021 2225   GLUCOSEU >=500 (A) 11/23/2021 2225   HGBUR TRACE (A) 11/23/2021 2225   BILIRUBINUR NEGATIVE 11/23/2021 2225   KETONESUR NEGATIVE 11/23/2021 2225   PROTEINUR 100 (A) 11/23/2021 2225   UROBILINOGEN 0.2 11/02/2020 2011   NITRITE POSITIVE (A) 11/23/2021 2225   LEUKOCYTESUR TRACE (A) 11/23/2021 2225   Sepsis Labs Invalid input(s): PROCALCITONIN,  WBC,  LACTICIDVEN Microbiology Recent Results (from the past 240 hour(s))  Blood Culture (routine x 2)     Status: Abnormal   Collection Time: 11/23/21 10:25 PM   Specimen: BLOOD  Result Value Ref Range Status   Specimen Description BLOOD SITE NOT SPECIFIED  Final   Special Requests   Final    BOTTLES DRAWN AEROBIC AND ANAEROBIC Blood Culture adequate volume   Culture  Setup Time   Final    GRAM NEGATIVE RODS IN BOTH AEROBIC AND ANAEROBIC BOTTLES CRITICAL RESULT CALLED TO, READ BACK BY AND VERIFIED WITH: E,BREWINGTON PHARMD @0900  11/24/21 EB Performed at [facility], [place] [address], [place], [place] [address]    Culture PROTEUS MIRABILIS (A)  Final   Report Status 11/26/2021 FINAL  Final   Organism ID, Bacteria PROTEUS MIRABILIS  Final      Susceptibility   Proteus mirabilis - MIC*    AMPICILLIN <=2 SENSITIVE Sensitive     CEFAZOLIN 8 SENSITIVE Sensitive     CEFEPIME <=0.12 SENSITIVE Sensitive     CEFTAZIDIME <=1 SENSITIVE Sensitive     CEFTRIAXONE <=0.25 SENSITIVE Sensitive     CIPROFLOXACIN <=0.25 SENSITIVE Sensitive      GENTAMICIN <=1 SENSITIVE Sensitive     IMIPENEM 2 SENSITIVE Sensitive     TRIMETH/SULFA <=20 SENSITIVE Sensitive     AMPICILLIN/SULBACTAM <=2 SENSITIVE Sensitive     PIP/TAZO <=4 SENSITIVE Sensitive     * PROTEUS MIRABILIS  Urine Culture     Status: Abnormal   Collection Time: 11/23/21 10:25 PM   Specimen: In/Out Cath Urine  Result Value Ref Range Status   Specimen Description IN/OUT CATH URINE  Final   Special Requests   Final    NONE Performed at [facility], [place] [address] [place], [place] [address]    Culture (A)  Final    >=100,000 COLONIES/mL PROTEUS MIRABILIS >=100,000 COLONIES/mL ESCHERICHIA COLI    Report Status 11/26/2021 FINAL  Final   Organism ID, Bacteria PROTEUS MIRABILIS (A)  Final   Organism ID, Bacteria ESCHERICHIA COLI (A)  Final      Susceptibility   Escherichia coli - MIC*    AMPICILLIN >=32 RESISTANT Resistant     CEFAZOLIN 16 SENSITIVE Sensitive     CEFEPIME <=0.12 SENSITIVE Sensitive     CEFTRIAXONE <=0.25 SENSITIVE Sensitive     CIPROFLOXACIN <=0.25 SENSITIVE Sensitive     GENTAMICIN <=1 SENSITIVE Sensitive     IMIPENEM <=0.25 SENSITIVE Sensitive     NITROFURANTOIN <=16 SENSITIVE Sensitive     TRIMETH/SULFA <=20 SENSITIVE Sensitive     AMPICILLIN/SULBACTAM >=32 RESISTANT Resistant     PIP/TAZO 8 SENSITIVE Sensitive     * >=100,000 COLONIES/mL ESCHERICHIA COLI   Proteus mirabilis - MIC*    AMPICILLIN <=2 SENSITIVE Sensitive     CEFAZOLIN 8 SENSITIVE Sensitive     CEFEPIME <=0.12 SENSITIVE Sensitive     CEFTRIAXONE <=0.25 SENSITIVE Sensitive     CIPROFLOXACIN <=0.25 SENSITIVE Sensitive     GENTAMICIN <=1 SENSITIVE  Sensitive     IMIPENEM 2 SENSITIVE Sensitive     NITROFURANTOIN 128 RESISTANT Resistant     TRIMETH/SULFA <=20 SENSITIVE Sensitive     AMPICILLIN/SULBACTAM <=2 SENSITIVE Sensitive     PIP/TAZO <=4 SENSITIVE Sensitive     * >=100,000 COLONIES/mL PROTEUS MIRABILIS  Blood Culture ID Panel (Reflexed)     Status: Abnormal    Collection Time: 11/23/21 10:25 PM  Result Value Ref Range Status   Enterococcus faecalis NOT DETECTED NOT DETECTED Final   Enterococcus Faecium NOT DETECTED NOT DETECTED Final   Listeria monocytogenes NOT DETECTED NOT DETECTED Final   Staphylococcus species NOT DETECTED NOT DETECTED Final   Staphylococcus aureus (BCID) NOT DETECTED NOT DETECTED Final   Staphylococcus epidermidis NOT DETECTED NOT DETECTED Final   Staphylococcus lugdunensis NOT DETECTED NOT DETECTED Final   Streptococcus species NOT DETECTED NOT DETECTED Final   Streptococcus agalactiae NOT DETECTED NOT DETECTED Final   Streptococcus pneumoniae NOT DETECTED NOT DETECTED Final   Streptococcus pyogenes NOT DETECTED NOT DETECTED Final   A.calcoaceticus-baumannii NOT DETECTED NOT DETECTED Final   Bacteroides fragilis NOT DETECTED NOT DETECTED Final   Enterobacterales DETECTED (A) NOT DETECTED Final    Comment: Enterobacterales represent a large order of gram negative bacteria, not a single organism. CRITICAL RESULT CALLED TO, READ BACK BY AND VERIFIED WITH: E,BREWINGTON PHARMD @0900  11/24/21 EB    Enterobacter cloacae complex NOT DETECTED NOT DETECTED Final   Escherichia coli NOT DETECTED NOT DETECTED Final   Klebsiella aerogenes NOT DETECTED NOT DETECTED Final   Klebsiella oxytoca NOT DETECTED NOT DETECTED Final   Klebsiella pneumoniae NOT DETECTED NOT DETECTED Final   Proteus species DETECTED (A) NOT DETECTED Final    Comment: CRITICAL RESULT CALLED TO, READ BACK BY AND VERIFIED WITH: E,BREWINGTON PHARMD @0900  11/24/21 EB    Salmonella species NOT DETECTED NOT DETECTED Final   Serratia marcescens NOT DETECTED NOT DETECTED Final   Haemophilus influenzae NOT DETECTED NOT DETECTED Final   Neisseria meningitidis NOT DETECTED NOT DETECTED Final   Pseudomonas aeruginosa NOT DETECTED NOT DETECTED Final   Stenotrophomonas maltophilia NOT DETECTED NOT DETECTED Final   Candida albicans NOT DETECTED NOT DETECTED Final   Candida  auris NOT DETECTED NOT DETECTED Final   Candida glabrata NOT DETECTED NOT DETECTED Final   Candida krusei NOT DETECTED NOT DETECTED Final   Candida parapsilosis NOT DETECTED NOT DETECTED Final   Candida tropicalis NOT DETECTED NOT DETECTED Final   Cryptococcus neoformans/gattii NOT DETECTED NOT DETECTED Final   CTX-M ESBL NOT DETECTED NOT DETECTED Final   Carbapenem resistance IMP NOT DETECTED NOT DETECTED Final   Carbapenem resistance KPC NOT DETECTED NOT DETECTED Final   Carbapenem resistance NDM NOT DETECTED NOT DETECTED Final   Carbapenem resist OXA 48 LIKE NOT DETECTED NOT DETECTED Final   Carbapenem resistance VIM NOT DETECTED NOT DETECTED Final    Comment: Performed at [facility], [address], [place], [place] [address]  Blood Culture (routine x 2)     Status: Abnormal   Collection Time: 11/23/21 10:30 PM   Specimen: BLOOD  Result Value Ref Range Status   Specimen Description BLOOD SITE NOT SPECIFIED  Final   Special Requests   Final    BOTTLES DRAWN AEROBIC AND ANAEROBIC Blood Culture adequate volume   Culture  Setup Time   Final    GRAM NEGATIVE RODS IN BOTH AEROBIC AND ANAEROBIC BOTTLES CRITICAL VALUE NOTED.  VALUE IS CONSISTENT WITH PREVIOUSLY REPORTED AND CALLED VALUE.  Culture (A)  Final    PROTEUS MIRABILIS SUSCEPTIBILITIES PERFORMED ON PREVIOUS CULTURE WITHIN THE LAST 5 DAYS. Performed at [facility], [address] [place], [place] [address]    Report Status 11/26/2021 FINAL  Final  Resp Panel by RT-PCR (Flu A&B, Covid) Nasopharyngeal Swab     Status: None   Collection Time: 11/23/21 10:40 PM   Specimen: Nasopharyngeal Swab; Nasopharyngeal(NP) swabs in vial transport medium  Result Value Ref Range Status   SARS Coronavirus 2 by RT PCR NEGATIVE NEGATIVE Final    Comment: (NOTE) SARS-CoV-2 target nucleic acids are NOT DETECTED.  The SARS-CoV-2 RNA is generally detectable in upper respiratory specimens during the acute phase of infection. The  lowest concentration of SARS-CoV-2 viral copies this assay can detect is 138 copies/mL. A negative result does not preclude SARS-Cov-2 infection and should not be used as the sole basis for treatment or other patient management decisions. A negative result may occur with  improper specimen collection/handling, submission of specimen other than nasopharyngeal swab, presence of viral mutation(s) within the areas targeted by this assay, and inadequate number of viral copies(<138 copies/mL). A negative result must be combined with clinical observations, patient history, and epidemiological information. The expected result is Negative.  Fact Sheet for Patients:  BloggerCourse.com  Fact Sheet for Healthcare Providers:  SeriousBroker.it  This test is no t yet approved or cleared by the [place] FDA and  has been authorized for detection and/or diagnosis of SARS-CoV-2 by FDA under an Emergency Use Authorization (EUA). This EUA will remain  in effect (meaning this test can be used) for the duration of the COVID-19 declaration under Section 564(b)(1) of the Act, 21 U.S.C.section 360bbb-3(b)(1), unless the authorization is terminated  or revoked sooner.       Influenza A by PCR NEGATIVE NEGATIVE Final   Influenza B by PCR NEGATIVE NEGATIVE Final    Comment: (NOTE) The Xpert Xpress SARS-CoV-2/FLU/RSV plus assay is intended as an aid in the diagnosis of influenza from Nasopharyngeal swab specimens and should not be used as a sole basis for treatment. Nasal washings and aspirates are unacceptable for Xpert Xpress SARS-CoV-2/FLU/RSV testing.  Fact Sheet for Patients: BloggerCourse.com  Fact Sheet for Healthcare Providers: SeriousBroker.it  This test is not yet approved or cleared by the [place] FDA and has been authorized for detection and/or diagnosis of SARS-CoV-2 by FDA under  an Emergency Use Authorization (EUA). This EUA will remain in effect (meaning this test can be used) for the duration of the COVID-19 declaration under Section 564(b)(1) of the Act, 21 U.S.C. section 360bbb-3(b)(1), unless the authorization is terminated or revoked.  Performed at [facility], [address] [place], [place] [address]      Time coordinating discharge: 35 minutes  SIGNED:   Glade Lloyd, MD  [facility] 11/26/2021, 3:18 PM

## 2021-11-26 NOTE — Progress Notes (Signed)
Pt. Transported by Sharin Mons to [facility].

## 2021-11-26 NOTE — Progress Notes (Addendum)
Civil engineer, contracting [facility]) Hospital Liaison Note  Received request from PMT/Shae, NP for family interest in [facility]. Visited patient at bedside.  Approval for [facility] is determined by [facility] MD. Once [facility] MD has determined [facility] eligibility, [facility] will update hospital staff and family.  Addendum Patient has been approved for [facility]. MSW explained services in depth to son/Bruce.   Consent forms are being completed at [facility] by family @ 3:30p.  PTAR notified of patient D/C and transport arranged by TOC/Heather. TOC, RN and Attending Physician aware of above updates.   Please send signed DNR form with patient and RN call report to [phone].    Odette Fraction, MSW [facility] Liaison [phone]

## 2021-11-26 NOTE — Progress Notes (Addendum)
Patient ID: Madison Maxwell, female   DOB: [birth year], 87 y.o.   MRN: [id number]  PROGRESS NOTE    Madison Maxwell  [id number] DOB: Jul 26, 1920 DOA: 11/23/2021 PCP: Crist Infante, MD   Brief Narrative:  87 y.o. female with history of dementia, paroxysmal atrial fibrillation not on anticoagulation presented from assisted living facility after she was found to be on the floor lethargic.  On presentation, she had a temperature of 101.9 with tachycardia and lactic acidosis.  UA was consistent with UTI.  Chest x-ray was unremarkable.  CT of the head and cervical spine was unremarkable.  X-ray of pelvis did not show any fracture.  She was started on IV fluids and antibiotics.  She was found to have Proteus bacteremia along with acute right-sided pyelonephritis with severe right hydronephrosis and possible right urolithiasis.  She also had paroxysmal A. fib with RVR requiring cardiology evaluation; amiodarone and heparin drips; cardiology subsequently discontinued the strips and switched her to oral amiodarone.  After discussion with urology, patient's family decided to not proceed with urological intervention.  After palliative care discussion, family decided to pursue comfort measures.  She was switched to comfort measures only status on 11/25/2021.  Assessment & Plan:   Comfort measures only status Severe sepsis: Present on admission; probably from below Acute right-sided pyelonephritis with severe right hydronephrosis and possible right ureterolithiasis Proteus bacteremia Leukocytosis Thrombocytopenia Paroxysmal A. fib with RVR History of dementia Acute kidney injury has been ruled out Mid buttocks stage I pressure injury: Present on admission Left heel stage I pressure injury: Present on admission Right heel stage I pressure injury: Present on admission   Plan -As discussed above, she was switched to comfort measures only status on 11/25/2021.  Palliative care will follow up with family  today regarding further disposition plan including possible residential hospice placement.   DVT prophylaxis: None for comfort.   Code Status: DNR Family Communication: Spoke to son on phone on 11/24/2021. Called son again on 11/25/2021 on phone but he didn't pick up Disposition Plan: Status is: Inpatient  Remains inpatient appropriate because: Of severity of illness.  Possible residential hospice placement if family agrees  Consultants: Cardiology/urology/palliative care  Procedures: None  Antimicrobials: Rocephin  11/23/2021-11/24/2021  Subjective: Patient seen and examined at bedside.  Extremely poor historian.  Confused. Objective: Vitals:   11/25/21 0747 11/25/21 0800 11/25/21 1038 11/25/21 2353  BP: 133/66 116/77 (!) 100/57 113/78  Pulse: 73 73 72 (!) 126  Resp: 16 15 16 16   Temp: 98.1 F (36.7 C) 98.1 F (36.7 C) 98 F (36.7 C) 99.1 F (37.3 C)  TempSrc: Oral Axillary Oral Axillary  SpO2: 98% 96% 95% 95%  Weight:      Height:        Intake/Output Summary (Last 24 hours) at 11/26/2021 0804 Last data filed at 11/26/2021 0300 Gross per 24 hour  Intake 0 ml  Output --  Net 0 ml    Filed Weights   11/24/21 0549 11/25/21 0402  Weight: 49.5 kg 50 kg    Examination:  General exam: Looks chronically ill and deconditioned.  Confused.  Currently no distress.  Currently on room air.    Data Reviewed: I have personally reviewed following labs and imaging studies  CBC: Recent Labs  Lab 11/23/21 2222 11/24/21 0612 11/25/21 0345 11/25/21 0820  WBC 9.2 12.7* 13.1* 12.8*  NEUTROABS 8.9*  --   --   --   HGB 14.3 12.1 12.7 13.0  HCT 44.2 35.8* 37.1 37.2  MCV 97.1 93.5 91.6 91.2  PLT 147* PLATELET CLUMPS NOTED ON SMEAR, UNABLE TO ESTIMATE 86* 78*    Basic Metabolic Panel: Recent Labs  Lab 11/23/21 2222 11/24/21 0612 11/25/21 0345 11/25/21 0820  NA 137  --  136  --   K 4.2  --  4.2  --   CL 102  --  103  --   CO2 18*  --  21*  --   GLUCOSE 234*  --   120*  --   BUN 28*  --  38*  --   CREATININE 1.24* 1.25* 1.31* 1.26*  CALCIUM 8.9  --  7.8*  --   MG  --   --  1.6*  --     GFR: Estimated Creatinine Clearance: 19.2 mL/min (A) (by C-G formula based on SCr of 1.26 mg/dL (H)). Liver Function Tests: Recent Labs  Lab 11/23/21 2222 11/25/21 0345  AST 54* 56*  ALT 38 31  ALKPHOS 74 75  BILITOT 0.9 0.6  PROT 6.5 4.7*  ALBUMIN 3.5 2.1*    No results for input(s): LIPASE, AMYLASE in the last 168 hours. No results for input(s): AMMONIA in the last 168 hours. Coagulation Profile: No results for input(s): INR, PROTIME in the last 168 hours. Cardiac Enzymes: Recent Labs  Lab 11/23/21 2222  CKTOTAL 65    BNP (last 3 results) No results for input(s): PROBNP in the last 8760 hours. HbA1C: Recent Labs    11/24/21 0612  HGBA1C 5.6    CBG: Recent Labs  Lab 11/23/21 2220 11/24/21 1128 11/24/21 1656  GLUCAP 239* 130* 154*    Lipid Profile: No results for input(s): CHOL, HDL, LDLCALC, TRIG, CHOLHDL, LDLDIRECT in the last 72 hours. Thyroid Function Tests: Recent Labs    11/23/21 2222  TSH 3.147    Anemia Panel: No results for input(s): VITAMINB12, FOLATE, FERRITIN, TIBC, IRON, RETICCTPCT in the last 72 hours. Sepsis Labs: Recent Labs  Lab 11/23/21 2222 11/23/21 2345  LATICACIDVEN >9.0* 8.3*     Recent Results (from the past 240 hour(s))  Blood Culture (routine x 2)     Status: Abnormal (Preliminary result)   Collection Time: 11/23/21 10:25 PM   Specimen: BLOOD  Result Value Ref Range Status   Specimen Description BLOOD SITE NOT SPECIFIED  Final   Special Requests   Final    BOTTLES DRAWN AEROBIC AND ANAEROBIC Blood Culture adequate volume   Culture  Setup Time   Final    GRAM NEGATIVE RODS IN BOTH AEROBIC AND ANAEROBIC BOTTLES CRITICAL RESULT CALLED TO, READ BACK BY AND VERIFIED WITH: E,BREWINGTON PHARMD @0900  11/24/21 EB    Culture (A)  Final    PROTEUS MIRABILIS SUSCEPTIBILITIES TO FOLLOW Performed  at [facility], [place] [address] [place], [place] [address]    Report Status PENDING  Incomplete  Urine Culture     Status: Abnormal (Preliminary result)   Collection Time: 11/23/21 10:25 PM   Specimen: In/Out Cath Urine  Result Value Ref Range Status   Specimen Description IN/OUT CATH URINE  Final   Special Requests NONE  Final   Culture (A)  Final    >=100,000 COLONIES/mL PROTEUS MIRABILIS >=100,000 COLONIES/mL ESCHERICHIA COLI SUSCEPTIBILITIES TO FOLLOW Performed at [facility], [place] [address] [place], [place] [address]    Report Status PENDING  Incomplete  Blood Culture ID Panel (Reflexed)     Status: Abnormal   Collection Time: 11/23/21 10:25 PM  Result Value Ref Range Status  Enterococcus faecalis NOT DETECTED NOT DETECTED Final   Enterococcus Faecium NOT DETECTED NOT DETECTED Final   Listeria monocytogenes NOT DETECTED NOT DETECTED Final   Staphylococcus species NOT DETECTED NOT DETECTED Final   Staphylococcus aureus (BCID) NOT DETECTED NOT DETECTED Final   Staphylococcus epidermidis NOT DETECTED NOT DETECTED Final   Staphylococcus lugdunensis NOT DETECTED NOT DETECTED Final   Streptococcus species NOT DETECTED NOT DETECTED Final   Streptococcus agalactiae NOT DETECTED NOT DETECTED Final   Streptococcus pneumoniae NOT DETECTED NOT DETECTED Final   Streptococcus pyogenes NOT DETECTED NOT DETECTED Final   A.calcoaceticus-baumannii NOT DETECTED NOT DETECTED Final   Bacteroides fragilis NOT DETECTED NOT DETECTED Final   Enterobacterales DETECTED (A) NOT DETECTED Final    Comment: Enterobacterales represent a large order of gram negative bacteria, not a single organism. CRITICAL RESULT CALLED TO, READ BACK BY AND VERIFIED WITH: E,BREWINGTON PHARMD @0900  11/24/21 EB    Enterobacter cloacae complex NOT DETECTED NOT DETECTED Final   Escherichia coli NOT DETECTED NOT DETECTED Final   Klebsiella aerogenes NOT DETECTED NOT DETECTED Final   Klebsiella oxytoca NOT  DETECTED NOT DETECTED Final   Klebsiella pneumoniae NOT DETECTED NOT DETECTED Final   Proteus species DETECTED (A) NOT DETECTED Final    Comment: CRITICAL RESULT CALLED TO, READ BACK BY AND VERIFIED WITH: E,BREWINGTON PHARMD @0900  11/24/21 EB    Salmonella species NOT DETECTED NOT DETECTED Final   Serratia marcescens NOT DETECTED NOT DETECTED Final   Haemophilus influenzae NOT DETECTED NOT DETECTED Final   Neisseria meningitidis NOT DETECTED NOT DETECTED Final   Pseudomonas aeruginosa NOT DETECTED NOT DETECTED Final   Stenotrophomonas maltophilia NOT DETECTED NOT DETECTED Final   Candida albicans NOT DETECTED NOT DETECTED Final   Candida auris NOT DETECTED NOT DETECTED Final   Candida glabrata NOT DETECTED NOT DETECTED Final   Candida krusei NOT DETECTED NOT DETECTED Final   Candida parapsilosis NOT DETECTED NOT DETECTED Final   Candida tropicalis NOT DETECTED NOT DETECTED Final   Cryptococcus neoformans/gattii NOT DETECTED NOT DETECTED Final   CTX-M ESBL NOT DETECTED NOT DETECTED Final   Carbapenem resistance IMP NOT DETECTED NOT DETECTED Final   Carbapenem resistance KPC NOT DETECTED NOT DETECTED Final   Carbapenem resistance NDM NOT DETECTED NOT DETECTED Final   Carbapenem resist OXA 48 LIKE NOT DETECTED NOT DETECTED Final   Carbapenem resistance VIM NOT DETECTED NOT DETECTED Final    Comment: Performed at [facility], [address] [place], [address] [place]  Blood Culture (routine x 2)     Status: Abnormal (Preliminary result)   Collection Time: 11/23/21 10:30 PM   Specimen: BLOOD  Result Value Ref Range Status   Specimen Description BLOOD SITE NOT SPECIFIED  Final   Special Requests   Final    BOTTLES DRAWN AEROBIC AND ANAEROBIC Blood Culture adequate volume   Culture  Setup Time   Final    GRAM NEGATIVE RODS IN BOTH AEROBIC AND ANAEROBIC BOTTLES CRITICAL VALUE NOTED.  VALUE IS CONSISTENT WITH PREVIOUSLY REPORTED AND CALLED VALUE. Performed at [facility]  [facility], [address], [place], [address] [place]    Culture PROTEUS MIRABILIS (A)  Final   Report Status PENDING  Incomplete  Resp Panel by RT-PCR (Flu A&B, Covid) Nasopharyngeal Swab     Status: None   Collection Time: 11/23/21 10:40 PM   Specimen: Nasopharyngeal Swab; Nasopharyngeal(NP) swabs in vial transport medium  Result Value Ref Range Status   SARS Coronavirus 2 by RT PCR NEGATIVE  NEGATIVE Final    Comment: (NOTE) SARS-CoV-2 target nucleic acids are NOT DETECTED.  The SARS-CoV-2 RNA is generally detectable in upper respiratory specimens during the acute phase of infection. The lowest concentration of SARS-CoV-2 viral copies this assay can detect is 138 copies/mL. A negative result does not preclude SARS-Cov-2 infection and should not be used as the sole basis for treatment or other patient management decisions. A negative result may occur with  improper specimen collection/handling, submission of specimen other than nasopharyngeal swab, presence of viral mutation(s) within the areas targeted by this assay, and inadequate number of viral copies(<138 copies/mL). A negative result must be combined with clinical observations, patient history, and epidemiological information. The expected result is Negative.  Fact Sheet for Patients:  EntrepreneurPulse.com.au  Fact Sheet for Healthcare Providers:  IncredibleEmployment.be  This test is no t yet approved or cleared by the [place] FDA and  has been authorized for detection and/or diagnosis of SARS-CoV-2 by FDA under an Emergency Use Authorization (EUA). This EUA will remain  in effect (meaning this test can be used) for the duration of the COVID-19 declaration under Section 564(b)(1) of the Act, 21 U.S.C.section 360bbb-3(b)(1), unless the authorization is terminated  or revoked sooner.       Influenza A by PCR NEGATIVE NEGATIVE Final   Influenza B by PCR NEGATIVE NEGATIVE Final    Comment:  (NOTE) The Xpert Xpress SARS-CoV-2/FLU/RSV plus assay is intended as an aid in the diagnosis of influenza from Nasopharyngeal swab specimens and should not be used as a sole basis for treatment. Nasal washings and aspirates are unacceptable for Xpert Xpress SARS-CoV-2/FLU/RSV testing.  Fact Sheet for Patients: EntrepreneurPulse.com.au  Fact Sheet for Healthcare Providers: IncredibleEmployment.be  This test is not yet approved or cleared by the [place] FDA and has been authorized for detection and/or diagnosis of SARS-CoV-2 by FDA under an Emergency Use Authorization (EUA). This EUA will remain in effect (meaning this test can be used) for the duration of the COVID-19 declaration under Section 564(b)(1) of the Act, 21 U.S.C. section 360bbb-3(b)(1), unless the authorization is terminated or revoked.  Performed at [facility], [place] [address] [place], [place] [address]           Radiology Studies: No results found.      Scheduled Meds:   Continuous Infusions:          Aline August, MD [facility] 11/26/2021, 8:04 AM

## 2022-01-01 DEATH — deceased

## 2022-02-26 ENCOUNTER — Ambulatory Visit: Payer: Self-pay | Admitting: Podiatry
